# Patient Record
Sex: Female | Born: 1975
Health system: Southern US, Community
[De-identification: ages and names within clinical notes are randomized; demographics above are authoritative.]

## PROBLEM LIST (undated history)

## (undated) DIAGNOSIS — M545 Low back pain, unspecified: Secondary | ICD-10-CM

## (undated) DIAGNOSIS — F410 Panic disorder [episodic paroxysmal anxiety] without agoraphobia: Secondary | ICD-10-CM

## (undated) DIAGNOSIS — Z8619 Personal history of other infectious and parasitic diseases: Secondary | ICD-10-CM

## (undated) DIAGNOSIS — E538 Deficiency of other specified B group vitamins: Secondary | ICD-10-CM

## (undated) DIAGNOSIS — F319 Bipolar disorder, unspecified: Secondary | ICD-10-CM

## (undated) DIAGNOSIS — F419 Anxiety disorder, unspecified: Secondary | ICD-10-CM

## (undated) DIAGNOSIS — E785 Hyperlipidemia, unspecified: Secondary | ICD-10-CM

## (undated) DIAGNOSIS — G8929 Other chronic pain: Secondary | ICD-10-CM

## (undated) DIAGNOSIS — I10 Essential (primary) hypertension: Secondary | ICD-10-CM

## (undated) DIAGNOSIS — E559 Vitamin D deficiency, unspecified: Secondary | ICD-10-CM

## (undated) DIAGNOSIS — E119 Type 2 diabetes mellitus without complications: Secondary | ICD-10-CM

## (undated) DIAGNOSIS — J45909 Unspecified asthma, uncomplicated: Secondary | ICD-10-CM

## (undated) DIAGNOSIS — K760 Fatty (change of) liver, not elsewhere classified: Secondary | ICD-10-CM

## (undated) DIAGNOSIS — F4310 Post-traumatic stress disorder, unspecified: Secondary | ICD-10-CM

## (undated) DIAGNOSIS — R5382 Chronic fatigue, unspecified: Secondary | ICD-10-CM

## (undated) DIAGNOSIS — G4700 Insomnia, unspecified: Secondary | ICD-10-CM

## (undated) HISTORY — DX: Essential (primary) hypertension: I10

## (undated) HISTORY — PX: PARTIAL HYSTERECTOMY: SHX80

## (undated) HISTORY — DX: Bipolar disorder, unspecified: F31.9

## (undated) HISTORY — PX: GASTROPLASTY DUODENAL SWITCH: SHX1699

## (undated) HISTORY — DX: Insomnia, unspecified: G47.00

## (undated) HISTORY — DX: Fatty (change of) liver, not elsewhere classified: K76.0

## (undated) HISTORY — DX: Post-traumatic stress disorder, unspecified: F43.10

## (undated) HISTORY — DX: Other chronic pain: G89.29

## (undated) HISTORY — PX: ABDOMINAL HYSTERECTOMY: SHX81

## (undated) HISTORY — DX: Hyperlipidemia, unspecified: E78.5

## (undated) HISTORY — DX: Personal history of other infectious and parasitic diseases: Z86.19

## (undated) HISTORY — DX: Morbid (severe) obesity due to excess calories: E66.01

## (undated) HISTORY — DX: Panic disorder (episodic paroxysmal anxiety): F41.0

## (undated) HISTORY — DX: Deficiency of other specified B group vitamins: E53.8

## (undated) HISTORY — DX: Anxiety disorder, unspecified: F41.9

## (undated) HISTORY — DX: Chronic fatigue, unspecified: R53.82

## (undated) HISTORY — DX: Type 2 diabetes mellitus without complications: E11.9

## (undated) HISTORY — DX: Unspecified asthma, uncomplicated: J45.909

## (undated) HISTORY — DX: Low back pain, unspecified: M54.50

## (undated) HISTORY — PX: TUBAL LIGATION: SHX77

## (undated) HISTORY — DX: Vitamin D deficiency, unspecified: E55.9

## (undated) HISTORY — PX: SALPINGECTOMY: SHX328

## (undated) HISTORY — DX: Low back pain: M54.5

## (undated) MED FILL — Iron Sucrose Inj 20 MG/ML (Fe Equiv): INTRAVENOUS | Qty: 10 | Status: AC

---

## 2004-03-07 ENCOUNTER — Emergency Department: Payer: Self-pay | Admitting: General Practice

## 2004-09-04 ENCOUNTER — Emergency Department: Payer: Self-pay | Admitting: Emergency Medicine

## 2004-11-14 ENCOUNTER — Emergency Department: Payer: Self-pay | Admitting: Internal Medicine

## 2005-07-08 ENCOUNTER — Emergency Department: Payer: Self-pay | Admitting: Emergency Medicine

## 2005-10-28 ENCOUNTER — Emergency Department: Payer: Self-pay | Admitting: Emergency Medicine

## 2006-01-02 ENCOUNTER — Encounter: Admission: RE | Admit: 2006-01-02 | Discharge: 2006-01-02 | Payer: Self-pay | Admitting: Family Medicine

## 2006-04-28 ENCOUNTER — Emergency Department: Payer: Self-pay | Admitting: Emergency Medicine

## 2006-12-29 ENCOUNTER — Emergency Department: Payer: Self-pay | Admitting: Emergency Medicine

## 2007-11-17 ENCOUNTER — Emergency Department: Payer: Self-pay | Admitting: Emergency Medicine

## 2007-11-30 ENCOUNTER — Emergency Department: Payer: Self-pay | Admitting: Emergency Medicine

## 2008-01-13 ENCOUNTER — Emergency Department: Payer: Self-pay | Admitting: Emergency Medicine

## 2008-02-22 ENCOUNTER — Ambulatory Visit: Payer: Self-pay | Admitting: Obstetrics and Gynecology

## 2008-05-31 ENCOUNTER — Emergency Department: Payer: Self-pay | Admitting: Emergency Medicine

## 2008-07-05 ENCOUNTER — Encounter: Payer: Self-pay | Admitting: Anesthesiology

## 2008-08-01 ENCOUNTER — Encounter: Payer: Self-pay | Admitting: Anesthesiology

## 2008-12-15 ENCOUNTER — Emergency Department: Payer: Self-pay | Admitting: Emergency Medicine

## 2009-10-01 ENCOUNTER — Ambulatory Visit: Payer: Self-pay | Admitting: Internal Medicine

## 2009-10-10 ENCOUNTER — Emergency Department: Payer: Self-pay | Admitting: Internal Medicine

## 2009-10-16 ENCOUNTER — Ambulatory Visit: Payer: Self-pay | Admitting: Internal Medicine

## 2009-10-17 ENCOUNTER — Ambulatory Visit: Payer: Self-pay | Admitting: Obstetrics and Gynecology

## 2009-10-20 ENCOUNTER — Ambulatory Visit: Payer: Self-pay | Admitting: Obstetrics and Gynecology

## 2009-11-01 ENCOUNTER — Ambulatory Visit: Payer: Self-pay | Admitting: Internal Medicine

## 2011-03-12 ENCOUNTER — Emergency Department: Payer: Self-pay | Admitting: Emergency Medicine

## 2011-04-24 ENCOUNTER — Ambulatory Visit: Payer: Self-pay | Admitting: Obstetrics and Gynecology

## 2011-05-06 ENCOUNTER — Ambulatory Visit: Payer: Self-pay | Admitting: Obstetrics and Gynecology

## 2011-05-09 LAB — PATHOLOGY REPORT

## 2012-01-10 ENCOUNTER — Ambulatory Visit: Payer: Self-pay

## 2012-01-15 ENCOUNTER — Ambulatory Visit: Payer: Self-pay

## 2012-01-22 ENCOUNTER — Ambulatory Visit: Payer: Self-pay

## 2012-02-02 ENCOUNTER — Ambulatory Visit: Payer: Self-pay

## 2012-03-03 ENCOUNTER — Ambulatory Visit: Payer: Self-pay

## 2012-06-15 ENCOUNTER — Ambulatory Visit: Payer: Self-pay | Admitting: Family Medicine

## 2012-08-26 ENCOUNTER — Ambulatory Visit: Payer: Self-pay | Admitting: Family Medicine

## 2012-10-01 ENCOUNTER — Ambulatory Visit: Payer: Self-pay | Admitting: Family Medicine

## 2012-12-23 ENCOUNTER — Emergency Department: Payer: Self-pay | Admitting: Emergency Medicine

## 2013-05-05 ENCOUNTER — Encounter (INDEPENDENT_AMBULATORY_CARE_PROVIDER_SITE_OTHER): Payer: Self-pay

## 2013-05-05 ENCOUNTER — Encounter: Payer: Self-pay | Admitting: Cardiovascular Disease

## 2013-05-05 ENCOUNTER — Ambulatory Visit (INDEPENDENT_AMBULATORY_CARE_PROVIDER_SITE_OTHER): Payer: BC Managed Care – PPO | Admitting: Cardiovascular Disease

## 2013-05-05 VITALS — BP 106/92 | HR 72 | Ht 71.0 in | Wt 240.5 lb

## 2013-05-05 DIAGNOSIS — R079 Chest pain, unspecified: Secondary | ICD-10-CM

## 2013-05-05 DIAGNOSIS — R0789 Other chest pain: Secondary | ICD-10-CM

## 2013-05-05 NOTE — Progress Notes (Signed)
Kiara Le Date of Birth  [birth year]       [facility]    [place] [address]  [address] [place], [place]  [address]   [place], [place]  [address] [phone]     [phone]   Fax  [phone]    Fax [phone]  Problem List: 1. Chest pain   History of Present Illness:  Kiara Le is a 37 yo who is referred for further evaluation of CP and dyspnea.   She was evaluated at [facility] ER and was released.   The pain started about 30-445 minutes after she had tried to eat and  lasted for 90 minutes.   She called [facility] ( has had bariatricsurgery at [facility] Oct. 7, 2014).   She called  EMS and was taken to [facility]  received SL NTG which seemed to relieved the CP.  She did have some fatigue.    She has had some CP last week also about 30 minutes after she ate something.  - lasted 30 minutes.   She did not call EMS that time.    She does not get any exercise (since her surgery)..  She has not eaten well since her surgery.   She has significant nausea and some gagging when she tries to eat.   She has lost 40 lbs since her Bariatric surgery in Oct.  She has been able to stop her diabetic medications, her blood pressure pills, and her cholesterol pills.  Current Outpatient Prescriptions  Medication Sig Dispense Refill  . CALCIUM PO Take 2,400 mg by mouth daily.      . Multiple Vitamin (MULTIVITAMIN) tablet Take 2 tablets by mouth daily.      . NON FORMULARY Vitamin A+D+E+K 1 tablet daily.      Marland Kitchen oxyCODONE-acetaminophen (PERCOCET) 10-325 MG per tablet Take 1 tablet by mouth as needed for pain.      . pantoprazole (PROTONIX) 40 MG tablet Take 40 mg by mouth daily.       No current facility-administered medications for this visit.     Allergies  Allergen Reactions  . Tetanus Toxoids     Swelling, hot    Past Medical History  Diagnosis Date  . Hypertension   . Hyperlipidemia   . Diabetes mellitus without complication     . Asthma     Past Surgical History  Procedure Laterality Date  . Tubal ligation    . Partial hysterectomy    . Gastroplasty duodenal switch      History  Smoking status  . Never Smoker   Smokeless tobacco  . Not on file    History  Alcohol Use No    Comment: occassional    Family History  Problem Relation Age of Onset  . Heart disease Father   . Hypertension Father   . Hyperlipidemia Father     Reviw of Systems:  Reviewed in the HPI.  All other systems are negative.  Physical Exam: Blood pressure 106/92, pulse 72, height 5\' 11"  (1.803 m), weight 240 lb 8 oz (109.09 kg). General: Young female, morbidly obese, in no acute distress.  Head: Normocephalic, atraumatic, sclera non-icteric, mucus membranes are moist,   Neck: Supple. Carotids are 2 + without bruits. No JVD   Lungs: Clear   Heart: RR, normal S1, S2  Abdomen: Soft, non-tender, non-distended with normal bowel sounds.  Msk:  Strength and tone are normal   Extremities: No clubbing or  cyanosis. No edema.  Distal pedal pulses are 2+ and equal    Neuro: CN II - XII intact.  Alert and oriented X 3.   Psych:  Normal   ECG: Dc. 3, 2014:  NSR at 72.  Low voltage.  She has no changes.   Assessment / Plan:

## 2013-05-05 NOTE — Patient Instructions (Signed)
Your physician recommends that you schedule a follow-up appointment in: 3 months. Your physician recommends that you continue on your current medications as directed. Please refer to the Current Medication list given to you today. 

## 2013-05-05 NOTE — Assessment & Plan Note (Signed)
Larrissa presents for further evaluation of several episodes of chest discomfort. These episodes of chest discomfort have occurred about 30 minutes after she had tried eats something. She's had significant difficulty eating since her bariatric surgery. She becomes nauseated and gags.  She does not get any exercise but does not have any episodes of chest pain doing her normal activities such as walking around the house.  She's had a for a work up in the [facility] emergency room and was discharged. Not on any evidence of myocardial infarction.  At this point I think that her pains are likely do to a GI etiology. She's had bariatric surgery and has had significant difficulties in eating. At times it she has had something the she's developed chest pain.  We discussed the possibility of doing a  [employer].  She is morbidly obese and the likelihood of her having a false-positive test is fairly high. I think that the odds of her having a false-positive test is greater than  a true positive.  I've asked her to try eating small portions. I've asked her to try to walk a little bit. She has recurrent episodes of chest pain that are associated with exertion and she needs to call us and or go to the hospital. I'll see her again in 3 months for followup visit. We'll reassess her at that time.  Im Pleased that she's been able to stop her diabetic medications hypercholesterolemia medications and blood pressure medications. She seems to be doing well and has lost 40 pounds since her bariatric surgery.

## 2013-05-26 ENCOUNTER — Encounter: Payer: Self-pay | Admitting: *Deleted

## 2013-06-28 ENCOUNTER — Ambulatory Visit: Payer: Self-pay | Admitting: Family Medicine

## 2013-07-21 ENCOUNTER — Ambulatory Visit: Payer: Self-pay | Admitting: Obstetrics and Gynecology

## 2013-07-21 LAB — HEMOGLOBIN: HGB: 13.4 g/dL (ref 12.0–16.0)

## 2013-07-26 ENCOUNTER — Ambulatory Visit: Payer: Self-pay | Admitting: Obstetrics and Gynecology

## 2013-07-26 LAB — URINALYSIS, COMPLETE
BILIRUBIN, UR: NEGATIVE
GLUCOSE, UR: NEGATIVE mg/dL (ref 0–75)
Ketone: NEGATIVE
NITRITE: NEGATIVE
Ph: 5 (ref 4.5–8.0)
RBC,UR: 39 /HPF (ref 0–5)
SPECIFIC GRAVITY: 1.019 (ref 1.003–1.030)
Squamous Epithelial: 5

## 2013-07-28 LAB — PATHOLOGY REPORT

## 2013-07-30 LAB — URINE CULTURE

## 2013-08-02 ENCOUNTER — Ambulatory Visit: Payer: BC Managed Care – PPO | Admitting: Cardiovascular Disease

## 2013-08-02 ENCOUNTER — Encounter: Payer: Self-pay | Admitting: *Deleted

## 2014-09-22 LAB — CBC AND DIFFERENTIAL
HCT: 40 % (ref 36–46)
Hemoglobin: 13.5 g/dL (ref 12.0–16.0)
NEUTROS ABS: 3 /uL
PLATELETS: 210 10*3/uL (ref 150–399)
WBC: 5.2 10*3/mL

## 2014-09-22 LAB — HEMOGLOBIN A1C: Hgb A1c MFr Bld: 5.3 % (ref 4.0–6.0)

## 2014-09-22 LAB — BASIC METABOLIC PANEL
BUN: 8 mg/dL (ref 4–21)
Creatinine: 0.6 mg/dL (ref 0.5–1.1)
GLUCOSE: 89 mg/dL
Potassium: 3.9 mmol/L (ref 3.4–5.3)
SODIUM: 141 mmol/L (ref 137–147)

## 2014-09-22 LAB — HEPATIC FUNCTION PANEL
ALT: 28 U/L (ref 7–35)
AST: 17 U/L (ref 13–35)
Alkaline Phosphatase: 106 U/L (ref 25–125)
Bilirubin, Total: 0.5 mg/dL

## 2014-09-22 LAB — LIPID PANEL
Cholesterol: 118 mg/dL (ref 0–200)
HDL: 49 mg/dL (ref 35–70)
LDL Cholesterol: 56 mg/dL
Triglycerides: 64 mg/dL (ref 40–160)

## 2014-09-22 LAB — TSH: TSH: 1.76 u[IU]/mL (ref 0.41–5.90)

## 2014-09-24 NOTE — Op Note (Signed)
PATIENT NAME:  Kiara Le, Kiara Le MR#:  [id number] DATE OF BIRTH:  [birth year]  DATE OF PROCEDURE:  07/26/2013  PREOPERATIVE DIAGNOSIS: Chronic pelvic pain.   POSTOPERATIVE DIAGNOSES: 1. Significant pelvic adhesions. 2. Right ovarian cyst with adhesions to the sidewall.    OPERATION:  1. Laparoscopic pelvic adhesiolysis incorporating greater than 70% of operating time.  2. Right salpingo-oophorectomy.  3. Left salpingectomy.   ANESTHESIA: General.  SURGEON: Boykin Nearing, MD  INDICATIONS: This is a 39 year old gravida 3, para 2 patient with chronic pelvic pain, right greater than left. The patient has undergone a gastric bypass and has been worked up for the source of the pain being gastrointestinal which was ruled out.   PROCEDURE: After adequate general endotracheal anesthesia, the patient was placed in the dorsal supine position with the legs in the Loraine stirrups. The patient was prepped and draped in normal sterile fashion. A Foley catheter was placed into the bladder and yielded 100 mL of clear urine. A sponge stick was placed in the vagina to be used for vaginal manipulation during the procedure. A 15 mm infraumbilical incision was made after injecting with 0.5% Marcaine. The laparoscope was advanced into the abdominal cavity with the Optiview cannula. The patient's abdomen was insufflated. The second port site was placed in the left lower quadrant 3 cm medial to the left anterior iliac spine. Incision was made. Brisk bleeding followed which required cautery with the Kleppingers and a deep 2-0 Vicryl suture to control the bleeding, 20 mL of blood loss during control of this bleeding. Ultimately, the 11 mm port was advanced into the  abdominal cavity under direct visualization. A third port site was placed in the right lower quadrant 3 cm medial to the right anterior iliac spine, 5 mm port was advanced into the abdominal cavity.   INITIAL IMPRESSION: Multiple adhesions noted with the  bowel to the vaginal cuff. The right ovary was densely adherent to the sidewall circumferentially with bowel and adhesions. Harmonic scalpel was brought up to the operative field and the adhesions were removed from the vaginal cuff to give better visualization to the right adnexa. Meticulous dissection occurred over the next hour to remove the scar tissue so that the infundibulopelvic ligament could be identified. The infundibulopelvic ligament was then cauterized with the Kleppingers and was then opened with the Harmonic scalpel. The course of the right ureter was identified prior to proceeding with this. Ultimately, the right fallopian tube and ovary was dissected free from the right ovarian fossa and from the bowel and adhesions inferiorly. Raw bleeding edge was controlled with Kleppinger cautery and Arista. The left fallopian tube was identified and the distal portion of fallopian tube was placed on traction and the Harmonic scalpel was used to remove this from the left ovary. There were some dense adhesions as well with the sigmoid colon attached to this left ovary. Harmonic scalpel was used to free the left ovary. The patient's abdomen was previously irrigated and as noted. Arista was placed at the right ovarian fossa bed to control hemostasis. Pressure was dropped to 6 mmHg, good hemostasis noted. The patient tolerated the procedure well. Again 70% of total operating time entailed adhesiolysis. The patient's abdomen was deflated and the port sites were closed with a deep fascial layer with for the infraumbilical and the left lower quadrant port sites, and port sites were closed with interrupted 4-0 subcuticular sutures. Steri-Strips applied and a Tegaderm placed. A Foley catheter was removed and the sponge stick was  removed. There were no complications. The patient tolerated the procedure well was taken to the recovery room in good condition. Of note, at the end of the case there was normal ureteral  peristalsis on the right.   ____________________________ Boykin Nearing, MD tjs:sg D: 07/26/2013 09:36:01 ET T: 07/26/2013 11:40:37 ET JOB#: [id number]  cc: Boykin Nearing, MD, <Dictator> Boykin Nearing MD ELECTRONICALLY SIGNED 07/29/2013 9:24

## 2014-11-24 ENCOUNTER — Ambulatory Visit (INDEPENDENT_AMBULATORY_CARE_PROVIDER_SITE_OTHER): Payer: PRIVATE HEALTH INSURANCE | Admitting: Family Medicine

## 2014-11-24 ENCOUNTER — Encounter: Payer: Self-pay | Admitting: Family Medicine

## 2014-11-24 ENCOUNTER — Encounter (INDEPENDENT_AMBULATORY_CARE_PROVIDER_SITE_OTHER): Payer: Self-pay

## 2014-11-24 VITALS — BP 122/78 | HR 85 | Temp 98.4°F | Resp 16 | Ht 70.0 in | Wt 186.6 lb

## 2014-11-24 DIAGNOSIS — F4001 Agoraphobia with panic disorder: Secondary | ICD-10-CM

## 2014-11-24 DIAGNOSIS — G8929 Other chronic pain: Secondary | ICD-10-CM | POA: Diagnosis not present

## 2014-11-24 DIAGNOSIS — R252 Cramp and spasm: Secondary | ICD-10-CM | POA: Insufficient documentation

## 2014-11-24 DIAGNOSIS — M5410 Radiculopathy, site unspecified: Secondary | ICD-10-CM | POA: Diagnosis not present

## 2014-11-24 DIAGNOSIS — M545 Low back pain, unspecified: Secondary | ICD-10-CM | POA: Insufficient documentation

## 2014-11-24 DIAGNOSIS — M5416 Radiculopathy, lumbar region: Secondary | ICD-10-CM | POA: Insufficient documentation

## 2014-11-24 DIAGNOSIS — K219 Gastro-esophageal reflux disease without esophagitis: Secondary | ICD-10-CM | POA: Insufficient documentation

## 2014-11-24 DIAGNOSIS — M25569 Pain in unspecified knee: Secondary | ICD-10-CM | POA: Insufficient documentation

## 2014-11-24 DIAGNOSIS — R5383 Other fatigue: Secondary | ICD-10-CM

## 2014-11-24 DIAGNOSIS — R5381 Other malaise: Secondary | ICD-10-CM | POA: Insufficient documentation

## 2014-11-24 MED ORDER — OXYCODONE-ACETAMINOPHEN 10-325 MG PO TABS: 1.0000 | ORAL_TABLET | Freq: Two times a day (BID) | ORAL | Status: DC | PRN

## 2014-11-24 MED ORDER — ALPRAZOLAM 1 MG PO TABS: 1.0000 mg | ORAL_TABLET | Freq: Two times a day (BID) | ORAL | Status: DC | PRN

## 2014-11-24 NOTE — Progress Notes (Signed)
Name: Kiara Le   MRN: [id number]    DOB: Oct 13, 1975   Date:11/24/2014       Progress Note  Subjective  Chief Complaint  Chief Complaint  Patient presents with  . Hypertension    Patient states that she is checking her blood pressure at home.   . Diabetes    Patient states that she is still checking blood sugars- they have been normal-patient reports  . Anxiety    Controlled well by Alprazolam.     Anxiety Presents for follow-up visit. The problem has been waxing and waning. Symptoms include depressed mood, dizziness, irritability and panic. Patient reports no chest pain, excessive worry, nervous/anxious behavior or shortness of breath. Symptoms occur most days. The severity of symptoms is moderate. The symptoms are aggravated by family issues and work stress. The quality of sleep is fair.   Past treatments include benzodiazephines.  Back Pain This is a chronic problem. The current episode started more than 1 year ago. The pain is present in the lumbar spine and gluteal. The quality of the pain is described as aching and stabbing. The pain radiates to the right thigh. The pain is at a severity of 4/10. The pain is moderate. The pain is the same all the time. The symptoms are aggravated by position. Pertinent negatives include no bladder incontinence, bowel incontinence, chest pain, dysuria, fever, leg pain or numbness.      Past Medical History  Diagnosis Date  . Hypertension   . Hyperlipidemia   . Diabetes mellitus without complication   . Asthma   . Insomnia   . History of Helicobacter pylori infection   . Vitamin B12 deficiency   . Vitamin D deficiency   . Morbid obesity   . Panic disorder   . Anxiety   . Fatty liver disease, nonalcoholic   . Chronic low back pain   . Chronic fatigue     Past Surgical History  Procedure Laterality Date  . Tubal ligation    . Partial hysterectomy    . Gastroplasty duodenal switch    . Salpingectomy      Family History    Problem Relation Age of Onset  . Heart disease Father   . Hypertension Father   . Hyperlipidemia Father   . Depression Sister   . Thyroid disease Sister     History   Social History  . Marital Status: Married    Spouse Name: Michiel Cowboy  . Number of Children: 2  . Years of Education: N/A   Occupational History  . Customer Service    Social History Main Topics  . Smoking status: Never Smoker   . Smokeless tobacco: Not on file  . Alcohol Use: No  . Drug Use: No  . Sexual Activity: Not on file   Other Topics Concern  . Not on file   Social History Narrative     Current outpatient prescriptions:  .  ALPRAZolam (XANAX) 1 MG tablet, Take 1 tablet by mouth 2 (two) times daily., Disp: , Rfl:  .  CALCIUM PO, Take 2,400 mg by mouth daily., Disp: , Rfl:  .  diclofenac sodium (VOLTAREN) 1 % GEL, Place 1 application onto the skin 2 (two) times daily., Disp: , Rfl:  .  ergocalciferol (VITAMIN D2) 50000 UNITS capsule, Take 1 capsule by mouth once a week., Disp: , Rfl:  .  glucose blood (ACCU-CHEK AVIVA PLUS) test strip, 1 strip by Does not apply route daily., Disp: , Rfl:  .  Hydrocodone-Acetaminophen 5-300 MG TABS, Take 1 tablet by mouth daily., Disp: , Rfl: 0 .  Multiple Vitamin (MULTIVITAMIN) tablet, Take 2 tablets by mouth daily., Disp: , Rfl:  .  NON FORMULARY, Vitamin A+D+E+K 1 tablet daily., Disp: , Rfl:  .  oxyCODONE-acetaminophen (PERCOCET) 10-325 MG per tablet, Take 1 tablet by mouth as needed for pain., Disp: , Rfl:  .  pantoprazole (PROTONIX) 40 MG tablet, Take 40 mg by mouth daily., Disp: , Rfl:   Allergies  Allergen Reactions  . Cyclobenzaprine     vaginal bleeding  . Duloxetine Hcl     constipation  . Fluoxetine     severely depressed sexual function  . Tetanus Toxoids     Swelling, hot     Review of Systems  Constitutional: Positive for irritability. Negative for fever.  Respiratory: Negative for shortness of breath.   Cardiovascular: Negative for chest  pain.  Gastrointestinal: Negative for bowel incontinence.  Genitourinary: Negative for bladder incontinence and dysuria.  Musculoskeletal: Positive for back pain.  Neurological: Positive for dizziness. Negative for numbness.  Psychiatric/Behavioral: The patient is not nervous/anxious.       Objective  Filed Vitals:   11/24/14 1606  BP: 122/78  Pulse: 85  Temp: 98.4 F (36.9 C)  Resp: 16  Height: 5\' 10"  (1.778 m)  Weight: 186 lb 9.6 oz (84.641 kg)  SpO2: 97%    Physical Exam  Constitutional: She is oriented to person, place, and time and well-developed, well-nourished, and in no distress.  HENT:  Head: Normocephalic and atraumatic.  Cardiovascular: Normal rate and regular rhythm.   Pulmonary/Chest: Effort normal and breath sounds normal.  Musculoskeletal:       Back:  Neurological: She is alert and oriented to person, place, and time.  Skin: Skin is warm and dry.  Psychiatric: Memory, affect and judgment normal.  Nursing note and vitals reviewed.   Assessment & Plan 1. Panic disorder with agoraphobia and moderate panic attacks Symptoms stable on present benzodiazepine therapy. Patient is aware of the tolerance potential of benzodiazepines. She is taking the medication as prescribed. Refills provided and follow-up in one month. - ALPRAZolam (XANAX) 1 MG tablet; Take 1 tablet (1 mg total) by mouth 2 (two) times daily as needed for anxiety.  Dispense: 60 tablet; Refill: 0  2. Chronic radicular low back pain Symptoms of low back pain are stable on present opioid therapy. Patient is compliant with the controlled substances agreement. She is aware of the potential interaction between opioids and benzodiazepines. No reported adverse effects. Refills provided and follow-up in one month. - oxyCODONE-acetaminophen (PERCOCET) 10-325 MG per tablet; Take 1 tablet by mouth 2 (two) times daily as needed for pain.  Dispense: 60 tablet; Refill: 0  There are no diagnoses linked to this  encounter.  Diar Berkel [facility] 11/24/2014 4:12 PM

## 2014-12-23 ENCOUNTER — Ambulatory Visit: Payer: PRIVATE HEALTH INSURANCE | Admitting: Family Medicine

## 2014-12-27 ENCOUNTER — Ambulatory Visit (INDEPENDENT_AMBULATORY_CARE_PROVIDER_SITE_OTHER): Payer: PRIVATE HEALTH INSURANCE | Admitting: Family Medicine

## 2014-12-27 ENCOUNTER — Encounter (INDEPENDENT_AMBULATORY_CARE_PROVIDER_SITE_OTHER): Payer: Self-pay

## 2014-12-27 ENCOUNTER — Encounter: Payer: Self-pay | Admitting: Family Medicine

## 2014-12-27 VITALS — BP 119/73 | HR 83 | Temp 98.3°F | Resp 17 | Ht 70.0 in | Wt 183.4 lb

## 2014-12-27 DIAGNOSIS — F418 Other specified anxiety disorders: Secondary | ICD-10-CM

## 2014-12-27 DIAGNOSIS — E876 Hypokalemia: Secondary | ICD-10-CM | POA: Diagnosis not present

## 2014-12-27 DIAGNOSIS — M5410 Radiculopathy, site unspecified: Secondary | ICD-10-CM | POA: Diagnosis not present

## 2014-12-27 DIAGNOSIS — M5416 Radiculopathy, lumbar region: Principal | ICD-10-CM

## 2014-12-27 DIAGNOSIS — F329 Major depressive disorder, single episode, unspecified: Secondary | ICD-10-CM

## 2014-12-27 DIAGNOSIS — A09 Infectious gastroenteritis and colitis, unspecified: Secondary | ICD-10-CM | POA: Diagnosis not present

## 2014-12-27 DIAGNOSIS — G8929 Other chronic pain: Secondary | ICD-10-CM | POA: Diagnosis not present

## 2014-12-27 DIAGNOSIS — F419 Anxiety disorder, unspecified: Secondary | ICD-10-CM

## 2014-12-27 DIAGNOSIS — F32A Depression, unspecified: Secondary | ICD-10-CM

## 2014-12-27 MED ORDER — OXYCODONE-ACETAMINOPHEN 10-325 MG PO TABS: 1.0000 | ORAL_TABLET | Freq: Two times a day (BID) | ORAL | Status: DC | PRN

## 2014-12-27 MED ORDER — ALPRAZOLAM 1 MG PO TABS
1.0000 mg | ORAL_TABLET | Freq: Two times a day (BID) | ORAL | Status: DC | PRN
Start: 2014-12-27 — End: 2015-01-24

## 2014-12-27 NOTE — Progress Notes (Signed)
Name: Kiara Le   MRN: [id number]    DOB: 04-17-1976   Date:12/27/2014       Progress Note  Subjective  Chief Complaint  Chief Complaint  Patient presents with  . Follow-up    Hosp. Chest pain    Diarrhea  This is a new problem. The problem has been resolved. The stool consistency is described as watery. Pertinent negatives include no chills or vomiting. There are no known risk factors. She has tried anti-motility drug, electrolyte solution and increased fluids for the symptoms. The treatment provided significant relief. There is no history of bowel resection, inflammatory bowel disease or malabsorption.  Anxiety Presents for follow-up visit. The problem has been unchanged. Symptoms include depressed mood, dizziness, irritability, nervous/anxious behavior and panic. Patient reports no excessive worry or shortness of breath. Symptoms occur most days. The severity of symptoms is moderate. The symptoms are aggravated by family issues and work stress. The quality of sleep is fair.   Past treatments include benzodiazephines. The treatment provided moderate relief. Compliance with prior treatments has been good.  Back Pain This is a chronic problem. The current episode started more than 1 year ago. The pain is present in the lumbar spine and gluteal. The quality of the pain is described as aching, stabbing and shooting. The pain radiates to the right thigh. The pain is at a severity of 4/10. The pain is moderate. The pain is the same all the time. The symptoms are aggravated by position and bending. Pertinent negatives include no bladder incontinence, bowel incontinence, leg pain or numbness. She has tried analgesics for the symptoms. The treatment provided significant relief.      Past Medical History  Diagnosis Date  . Hypertension   . Hyperlipidemia   . Diabetes mellitus without complication   . Asthma   . Insomnia   . History of Helicobacter pylori infection   . Vitamin B12  deficiency   . Vitamin D deficiency   . Morbid obesity   . Panic disorder   . Anxiety   . Fatty liver disease, nonalcoholic   . Chronic low back pain   . Chronic fatigue     Past Surgical History  Procedure Laterality Date  . Tubal ligation    . Partial hysterectomy    . Gastroplasty duodenal switch    . Salpingectomy      Family History  Problem Relation Age of Onset  . Heart disease Father   . Hypertension Father   . Hyperlipidemia Father   . Depression Sister   . Thyroid disease Sister     History   Social History  . Marital Status: Married    Spouse Name: Michiel Cowboy  . Number of Children: 2  . Years of Education: N/A   Occupational History  . Customer Service    Social History Main Topics  . Smoking status: Never Smoker   . Smokeless tobacco: Not on file  . Alcohol Use: No  . Drug Use: No  . Sexual Activity: Not on file   Other Topics Concern  . Not on file   Social History Narrative     Current outpatient prescriptions:  .  ALPRAZolam (XANAX) 1 MG tablet, Take 1 tablet (1 mg total) by mouth 2 (two) times daily as needed for anxiety., Disp: 60 tablet, Rfl: 0 .  CALCIUM PO, Take 2,400 mg by mouth daily., Disp: , Rfl:  .  diclofenac sodium (VOLTAREN) 1 % GEL, Place 1 application onto the skin 2 (two)  times daily., Disp: , Rfl:  .  ergocalciferol (VITAMIN D2) 50000 UNITS capsule, Take 1 capsule by mouth once a week., Disp: , Rfl:  .  glucose blood (ACCU-CHEK AVIVA PLUS) test strip, 1 strip by Does not apply route daily., Disp: , Rfl:  .  Hydrocodone-Acetaminophen 5-300 MG TABS, Take 1 tablet by mouth daily., Disp: , Rfl: 0 .  Multiple Vitamin (MULTIVITAMIN) tablet, Take 2 tablets by mouth daily., Disp: , Rfl:  .  NON FORMULARY, Vitamin A+D+E+K 1 tablet daily., Disp: , Rfl:  .  oxyCODONE-acetaminophen (PERCOCET) 10-325 MG per tablet, Take 1 tablet by mouth 2 (two) times daily as needed for pain., Disp: 60 tablet, Rfl: 0 .  pantoprazole (PROTONIX) 40 MG  tablet, Take 40 mg by mouth daily., Disp: , Rfl:   Allergies  Allergen Reactions  . 2,4-D Dimethylamine (Amisol) Swelling  . Cyclobenzaprine     vaginal bleeding  . Duloxetine Hcl     constipation  . Fluoxetine     severely depressed sexual function  . Nsaids Other (See Comments)    Cannot take due to abd surgery  . Tetanus Toxoids     Swelling, hot     Review of Systems  Constitutional: Positive for irritability. Negative for chills.  Respiratory: Negative for shortness of breath.   Gastrointestinal: Positive for diarrhea. Negative for vomiting and bowel incontinence.  Genitourinary: Negative for bladder incontinence.  Musculoskeletal: Positive for back pain.  Neurological: Positive for dizziness. Negative for numbness.  Psychiatric/Behavioral: Positive for depression. The patient is nervous/anxious.       Objective  Filed Vitals:   12/27/14 1006  BP: 119/73  Pulse: 83  Temp: 98.3 F (36.8 C)  TempSrc: Oral  Resp: 17  Height: 5\' 10"  (1.778 m)  Weight: 183 lb 6.4 oz (83.19 kg)  SpO2: 98%    Physical Exam  Constitutional: She is oriented to person, place, and time and well-developed, well-nourished, and in no distress.  Cardiovascular: Normal rate and regular rhythm.   Pulmonary/Chest: Effort normal and breath sounds normal.  Abdominal: Soft. Bowel sounds are normal.  Musculoskeletal:       Lumbar back: She exhibits decreased range of motion, tenderness, pain and spasm. She exhibits no swelling.       Back:  Neurological: She is alert and oriented to person, place, and time.  Skin: Skin is warm and dry.  Psychiatric: Mood, memory, affect and judgment normal.  Nursing note and vitals reviewed.    Assessment & Plan 1. Chronic radicular low back pain Pain responsive to chronic opioid therapy. Patient compliant with controlled substances agreement. She is aware of the dependence potential of opioids. Refills provided and follow-up in one month -  oxyCODONE-acetaminophen (PERCOCET) 10-325 MG per tablet; Take 1 tablet by mouth 2 (two) times daily as needed for pain.  Dispense: 60 tablet; Refill: 0  2. Anxiety and depression Symptoms of anxiety are responsive to alprazolam 1 mg twice a day when necessary. Patient is aware of the dependence potential and the interactions of benzodiazepines. I have recommended that she should contact Dr. Bridgett Larsson to be restarted on an antidepressant. Patient verbalized understanding. - ALPRAZolam (XANAX) 1 MG tablet; Take 1 tablet (1 mg total) by mouth 2 (two) times daily as needed for anxiety.  Dispense: 60 tablet; Refill: 0  3. Diarrhea of infectious origin Diarrhea has resolved. Notes from [facility] reviewed. Recheck comprehensive metabolic panel. - Comprehensive Metabolic Panel (CMET)  4. Hypokalemia, gastrointestinal losses Recheck abnormal labs from [facility]. - Comprehensive  Metabolic Panel (CMET)  5. Hypomagnesemia Recheck abnormal labs from [facility] - Comprehensive Metabolic Panel (CMET) - Magnesium    Kelsee Preslar [facility] 12/27/2014 10:51 AM

## 2014-12-28 LAB — COMPREHENSIVE METABOLIC PANEL
A/G RATIO: 1.8 (ref 1.1–2.5)
ALK PHOS: 107 IU/L (ref 39–117)
ALT: 26 IU/L (ref 0–32)
AST: 17 IU/L (ref 0–40)
Albumin: 4.1 g/dL (ref 3.5–5.5)
BILIRUBIN TOTAL: 0.4 mg/dL (ref 0.0–1.2)
BUN / CREAT RATIO: 13 (ref 8–20)
BUN: 9 mg/dL (ref 6–20)
CHLORIDE: 101 mmol/L (ref 97–108)
CO2: 24 mmol/L (ref 18–29)
Calcium: 9 mg/dL (ref 8.7–10.2)
Creatinine, Ser: 0.68 mg/dL (ref 0.57–1.00)
GFR calc non Af Amer: 113 mL/min/{1.73_m2} (ref >59)
GFR, EST AFRICAN AMERICAN: 130 mL/min/{1.73_m2} (ref >59)
GLOBULIN, TOTAL: 2.3 g/dL (ref 1.5–4.5)
Glucose: 86 mg/dL (ref 65–99)
Potassium: 4.6 mmol/L (ref 3.5–5.2)
Sodium: 141 mmol/L (ref 134–144)
Total Protein: 6.4 g/dL (ref 6.0–8.5)

## 2014-12-30 LAB — MAGNESIUM: Magnesium: 2 mg/dL (ref 1.6–2.3)

## 2015-01-03 LAB — SPECIMEN STATUS REPORT

## 2015-01-03 NOTE — Progress Notes (Signed)
Patient notified

## 2015-01-24 ENCOUNTER — Ambulatory Visit (INDEPENDENT_AMBULATORY_CARE_PROVIDER_SITE_OTHER): Payer: PRIVATE HEALTH INSURANCE | Admitting: Family Medicine

## 2015-01-24 ENCOUNTER — Encounter: Payer: Self-pay | Admitting: Family Medicine

## 2015-01-24 VITALS — BP 121/70 | HR 81 | Temp 98.3°F | Resp 18 | Ht 70.0 in | Wt 181.2 lb

## 2015-01-24 DIAGNOSIS — M1711 Unilateral primary osteoarthritis, right knee: Secondary | ICD-10-CM

## 2015-01-24 DIAGNOSIS — M5410 Radiculopathy, site unspecified: Secondary | ICD-10-CM | POA: Diagnosis not present

## 2015-01-24 DIAGNOSIS — F4001 Agoraphobia with panic disorder: Secondary | ICD-10-CM

## 2015-01-24 DIAGNOSIS — K219 Gastro-esophageal reflux disease without esophagitis: Secondary | ICD-10-CM | POA: Diagnosis not present

## 2015-01-24 DIAGNOSIS — G8929 Other chronic pain: Secondary | ICD-10-CM | POA: Diagnosis not present

## 2015-01-24 DIAGNOSIS — T753XXA Motion sickness, initial encounter: Secondary | ICD-10-CM

## 2015-01-24 DIAGNOSIS — M129 Arthropathy, unspecified: Secondary | ICD-10-CM

## 2015-01-24 DIAGNOSIS — M5416 Radiculopathy, lumbar region: Principal | ICD-10-CM

## 2015-01-24 MED ORDER — SCOPOLAMINE 1 MG/3DAYS TD PT72: 1.0000 | MEDICATED_PATCH | TRANSDERMAL | Status: DC

## 2015-01-24 MED ORDER — OXYCODONE-ACETAMINOPHEN 10-325 MG PO TABS: 1.0000 | ORAL_TABLET | Freq: Two times a day (BID) | ORAL | Status: DC | PRN

## 2015-01-24 MED ORDER — ALPRAZOLAM 1 MG PO TABS: 1.0000 mg | ORAL_TABLET | Freq: Two times a day (BID) | ORAL | Status: DC | PRN

## 2015-01-24 MED ORDER — PANTOPRAZOLE SODIUM 40 MG PO TBEC: 40.0000 mg | DELAYED_RELEASE_TABLET | Freq: Two times a day (BID) | ORAL | Status: DC

## 2015-01-24 MED ORDER — DICLOFENAC SODIUM 1 % TD GEL: 4.0000 g | Freq: Every day | TRANSDERMAL | Status: DC

## 2015-01-24 NOTE — Progress Notes (Signed)
Name: Kiara Le   MRN: [id number]    DOB: 09/26/1975   Date:01/24/2015       Progress Note  Subjective  Chief Complaint  Chief Complaint  Patient presents with  . Follow-up    4wk  . Medication Refill    all meds    Anxiety Presents for follow-up visit. Symptoms include depressed mood, nervous/anxious behavior, panic and shortness of breath. Patient reports no chest pain, insomnia or nausea.   Her past medical history is significant for anxiety/panic attacks. Past treatments include benzodiazephines. The treatment provided significant relief. Compliance with prior treatments has been good.  Gastrophageal Reflux She complains of heartburn. She reports no chest pain, no dysphagia or no nausea. She has tried a PPI for the symptoms. The treatment provided significant relief. Past procedures include an EGD (Bariatric Surgery at [facility]  Arthritis Presents for follow-up visit. She complains of pain and stiffness. She reports no joint swelling. Affected locations include the right knee. Her pain is at a severity of 6/10. Pertinent negatives include no fever. Past treatments include NSAIDs.  Back Pain This is a chronic problem. The problem is unchanged. The pain is present in the lumbar spine and gluteal. The quality of the pain is described as aching. The pain does not radiate. The pain is at a severity of 4/10. The symptoms are aggravated by bending, sitting and standing. Pertinent negatives include no chest pain or fever. She has tried analgesics for the symptoms.  pt. also requesting transdermal scopolamine patches for history of motion sickness. She will be leaving on a cruise in November 2016 and has experienced sea-sickness in the past.   Past Medical History  Diagnosis Date  . Hypertension   . Hyperlipidemia   . Diabetes mellitus without complication   . Asthma   . Insomnia   . History of Helicobacter pylori infection   . Vitamin B12 deficiency   . Vitamin D deficiency   .  Morbid obesity   . Panic disorder   . Anxiety   . Fatty liver disease, nonalcoholic   . Chronic low back pain   . Chronic fatigue     Past Surgical History  Procedure Laterality Date  . Tubal ligation    . Partial hysterectomy    . Gastroplasty duodenal switch    . Salpingectomy      Family History  Problem Relation Age of Onset  . Heart disease Father   . Hypertension Father   . Hyperlipidemia Father   . Depression Sister   . Thyroid disease Sister     Social History   Social History  . Marital Status: Married    Spouse Name: Michiel Cowboy  . Number of Children: 2  . Years of Education: N/A   Occupational History  . Customer Service    Social History Main Topics  . Smoking status: Never Smoker   . Smokeless tobacco: Not on file  . Alcohol Use: No  . Drug Use: No  . Sexual Activity: Not on file   Other Topics Concern  . Not on file   Social History Narrative     Current outpatient prescriptions:  .  ALPRAZolam (XANAX) 1 MG tablet, Take 1 tablet (1 mg total) by mouth 2 (two) times daily as needed for anxiety., Disp: 60 tablet, Rfl: 0 .  diclofenac sodium (VOLTAREN) 1 % GEL, Place 1 application onto the skin 2 (two) times daily., Disp: , Rfl:  .  ergocalciferol (VITAMIN D2) 50000 UNITS capsule, Take 1  capsule by mouth once a week., Disp: , Rfl:  .  glucose blood (ACCU-CHEK AVIVA PLUS) test strip, 1 strip by Does not apply route daily., Disp: , Rfl:  .  Multiple Vitamin (MULTIVITAMIN) tablet, Take 2 tablets by mouth daily., Disp: , Rfl:  .  NON FORMULARY, Vitamin A+D+E+K 1 tablet daily., Disp: , Rfl:  .  oxyCODONE-acetaminophen (PERCOCET) 10-325 MG per tablet, Take 1 tablet by mouth 2 (two) times daily as needed for pain., Disp: 60 tablet, Rfl: 0 .  pantoprazole (PROTONIX) 40 MG tablet, Take 40 mg by mouth daily., Disp: , Rfl:   Allergies  Allergen Reactions  . 2,4-D Dimethylamine (Amisol) Swelling  . Cyclobenzaprine     vaginal bleeding  . Duloxetine Hcl      constipation  . Fluoxetine     severely depressed sexual function  . Nsaids Other (See Comments)    Cannot take due to abd surgery  . Tetanus Toxoids     Swelling, hot     Review of Systems  Constitutional: Negative for fever, chills and malaise/fatigue.  Respiratory: Positive for shortness of breath.   Cardiovascular: Negative for chest pain.  Gastrointestinal: Positive for heartburn. Negative for dysphagia and nausea.  Musculoskeletal: Positive for back pain, joint pain, arthritis and stiffness. Negative for joint swelling.  Psychiatric/Behavioral: The patient is nervous/anxious. The patient does not have insomnia.       Objective  Filed Vitals:   01/24/15 0806  BP: 121/70  Pulse: 81  Temp: 98.3 F (36.8 C)  TempSrc: Oral  Resp: 18  Height: 5\' 10"  (1.778 m)  Weight: 181 lb 3.2 oz (82.192 kg)  SpO2: 99%    Physical Exam  Constitutional: She is oriented to person, place, and time and well-developed, well-nourished, and in no distress.  Cardiovascular: Normal rate and regular rhythm.   Pulmonary/Chest: Effort normal and breath sounds normal.  Musculoskeletal:       Right knee: She exhibits normal range of motion, no swelling and no effusion. No tenderness found.       Lumbar back: She exhibits pain. She exhibits normal range of motion, no tenderness, no swelling and no spasm.       Back:  Neurological: She is alert and oriented to person, place, and time.  Skin: Skin is warm and dry.  Psychiatric: Memory, affect and judgment normal.  Nursing note and vitals reviewed.   Assessment & Plan  1. Chronic radicular low back pain Stable on chronic opioid therapy. Patient aware of the drug interactions and dependence potential of opioids. She is compliant with controlled substances agreement. Refills provided and follow-up in one month. - oxyCODONE-acetaminophen (PERCOCET) 10-325 MG per tablet; Take 1 tablet by mouth 2 (two) times daily as needed for pain.  Dispense: 60  tablet; Refill: 0  2. Panic disorder with agoraphobia and moderate panic attacks Symptoms stable on alprazolam 1 mg twice a day when necessary. Patient aware of dependence potential of benzodiazepines. Refills provided and follow-up in one month. - ALPRAZolam (XANAX) 1 MG tablet; Take 1 tablet (1 mg total) by mouth 2 (two) times daily as needed for anxiety.  Dispense: 60 tablet; Refill: 0  3. Arthritis of right knee Using Voltaren gel for arthritis of the knees. Voltaren gel use is approved by her bariatric specialist. - diclofenac sodium (VOLTAREN) 1 % GEL; Apply 4 g topically daily.  Dispense: 100 g; Refill: 0  4. Gastroesophageal reflux disease, esophagitis presence not specified  - pantoprazole (PROTONIX) 40 MG tablet; Take 1  tablet (40 mg total) by mouth 2 (two) times daily.  Dispense: 90 tablet; Refill: 0  5. Motion sickness, initial encounter  - scopolamine (TRANSDERM-SCOP, 1.5 MG,) 1 MG/3DAYS; Place 1 patch (1.5 mg total) onto the skin every 3 (three) days.  Dispense: 10 patch; Refill: 1   Pankaj Haack Asad A. [facility] 01/24/2015 8:19 AM

## 2015-01-30 ENCOUNTER — Other Ambulatory Visit: Payer: Self-pay | Admitting: Family Medicine

## 2015-02-09 ENCOUNTER — Other Ambulatory Visit: Payer: Self-pay | Admitting: Obstetrics and Gynecology

## 2015-02-09 DIAGNOSIS — Z1231 Encounter for screening mammogram for malignant neoplasm of breast: Secondary | ICD-10-CM

## 2015-02-14 ENCOUNTER — Ambulatory Visit
Admission: RE | Admit: 2015-02-14 | Discharge: 2015-02-14 | Disposition: A | Discharge status: Home / Self Care | Payer: PRIVATE HEALTH INSURANCE | Source: Ambulatory Visit | Attending: Obstetrics and Gynecology | Admitting: Obstetrics and Gynecology

## 2015-02-14 DIAGNOSIS — Z1231 Encounter for screening mammogram for malignant neoplasm of breast: Secondary | ICD-10-CM

## 2015-02-17 ENCOUNTER — Ambulatory Visit: Payer: PRIVATE HEALTH INSURANCE | Admitting: Family Medicine

## 2015-03-01 ENCOUNTER — Ambulatory Visit: Payer: PRIVATE HEALTH INSURANCE | Admitting: Family Medicine

## 2015-03-02 ENCOUNTER — Ambulatory Visit (INDEPENDENT_AMBULATORY_CARE_PROVIDER_SITE_OTHER): Payer: PRIVATE HEALTH INSURANCE | Admitting: Family Medicine

## 2015-03-02 ENCOUNTER — Encounter: Payer: Self-pay | Admitting: Family Medicine

## 2015-03-02 VITALS — BP 118/64 | HR 79 | Temp 98.4°F | Resp 16 | Ht 70.0 in | Wt 179.4 lb

## 2015-03-02 DIAGNOSIS — K219 Gastro-esophageal reflux disease without esophagitis: Secondary | ICD-10-CM | POA: Diagnosis not present

## 2015-03-02 DIAGNOSIS — F4001 Agoraphobia with panic disorder: Secondary | ICD-10-CM

## 2015-03-02 DIAGNOSIS — Z23 Encounter for immunization: Secondary | ICD-10-CM | POA: Diagnosis not present

## 2015-03-02 DIAGNOSIS — M5410 Radiculopathy, site unspecified: Secondary | ICD-10-CM

## 2015-03-02 DIAGNOSIS — G8929 Other chronic pain: Secondary | ICD-10-CM

## 2015-03-02 DIAGNOSIS — M5416 Radiculopathy, lumbar region: Secondary | ICD-10-CM

## 2015-03-02 MED ORDER — ALPRAZOLAM 1 MG PO TABS: 1.0000 mg | ORAL_TABLET | Freq: Two times a day (BID) | ORAL | Status: DC | PRN

## 2015-03-02 MED ORDER — OXYCODONE-ACETAMINOPHEN 10-325 MG PO TABS
1.0000 | ORAL_TABLET | Freq: Two times a day (BID) | ORAL | Status: DC | PRN
Start: 2015-03-02 — End: 2015-03-28

## 2015-03-02 MED ORDER — PANTOPRAZOLE SODIUM 40 MG PO TBEC: 40.0000 mg | DELAYED_RELEASE_TABLET | Freq: Two times a day (BID) | ORAL | Status: DC

## 2015-03-02 NOTE — Progress Notes (Signed)
Name: Kiara Le   MRN: [id number]    DOB: May 06, 1976   Date:03/02/2015       Progress Note  Subjective  Chief Complaint  Chief Complaint  Patient presents with  . Gastrophageal Reflux    pt here for medication refills  . Anxiety  . Pain   Anxiety Presents for follow-up visit. The problem has been unchanged. Symptoms include chest pain, excessive worry, insomnia, nervous/anxious behavior and panic. Patient reports no malaise or nausea. Symptoms occur most days. The severity of symptoms is moderate. The symptoms are aggravated by family issues.   Past treatments include benzodiazephines. The treatment provided significant relief. Compliance with prior treatments has been good.  Back Pain This is a chronic problem. The problem occurs daily. The problem is unchanged. The pain is present in the lumbar spine and gluteal. The quality of the pain is described as aching. The pain is at a severity of 4/10. The pain is moderate. The symptoms are aggravated by bending. Associated symptoms include chest pain. Pertinent negatives include no bladder incontinence, bowel incontinence, numbness or paresis. She has tried analgesics for the symptoms.  Gastrophageal Reflux She complains of chest pain and heartburn. She reports no coughing, no nausea or no sore throat. This is a chronic problem. The problem has been unchanged. Pertinent negatives include no anemia. Risk factors include obesity (s/p bariatric surgery). She has tried a PPI for the symptoms. The treatment provided significant relief.    Past Medical History  Diagnosis Date  . Hypertension   . Hyperlipidemia   . Diabetes mellitus without complication   . Asthma   . Insomnia   . History of Helicobacter pylori infection   . Vitamin B12 deficiency   . Vitamin D deficiency   . Morbid obesity   . Panic disorder   . Anxiety   . Fatty liver disease, nonalcoholic   . Chronic low back pain   . Chronic fatigue     Past Surgical History    Procedure Laterality Date  . Tubal ligation    . Partial hysterectomy    . Gastroplasty duodenal switch    . Salpingectomy      Family History  Problem Relation Age of Onset  . Heart disease Father   . Hypertension Father   . Hyperlipidemia Father   . Depression Sister   . Thyroid disease Sister     Social History   Social History  . Marital Status: Married    Spouse Name: Michiel Cowboy  . Number of Children: 2  . Years of Education: N/A   Occupational History  . Customer Service    Social History Main Topics  . Smoking status: Never Smoker   . Smokeless tobacco: Not on file  . Alcohol Use: No  . Drug Use: No  . Sexual Activity: Not on file   Other Topics Concern  . Not on file   Social History Narrative     Current outpatient prescriptions:  .  ALPRAZolam (XANAX) 1 MG tablet, Take 1 tablet (1 mg total) by mouth 2 (two) times daily as needed for anxiety., Disp: 60 tablet, Rfl: 0 .  diclofenac sodium (VOLTAREN) 1 % GEL, Apply 4 g topically daily., Disp: 100 g, Rfl: 0 .  ergocalciferol (VITAMIN D2) 50000 UNITS capsule, Take 1 capsule by mouth once a week., Disp: , Rfl:  .  glucose blood (ACCU-CHEK AVIVA PLUS) test strip, 1 strip by Does not apply route daily., Disp: , Rfl:  .  Multiple Vitamin (  MULTIVITAMIN) tablet, Take 2 tablets by mouth daily., Disp: , Rfl:  .  NON FORMULARY, Vitamin A+D+E+K 1 tablet daily., Disp: , Rfl:  .  oxyCODONE-acetaminophen (PERCOCET) 10-325 MG per tablet, Take 1 tablet by mouth 2 (two) times daily as needed for pain., Disp: 60 tablet, Rfl: 0 .  pantoprazole (PROTONIX) 40 MG tablet, Take 1 tablet (40 mg total) by mouth 2 (two) times daily., Disp: 90 tablet, Rfl: 0 .  scopolamine (TRANSDERM-SCOP, 1.5 MG,) 1 MG/3DAYS, Place 1 patch (1.5 mg total) onto the skin every 3 (three) days., Disp: 10 patch, Rfl: 1  Allergies  Allergen Reactions  . 2,4-D Dimethylamine (Amisol) Swelling  . Duloxetine Hcl     constipation  . Fluoxetine     severely  depressed sexual function  . Nsaids Other (See Comments)    Cannot take due to abd surgery  . Tetanus Toxoids     Swelling, hot   Review of Systems  HENT: Negative for sore throat.   Respiratory: Negative for cough.   Cardiovascular: Positive for chest pain.  Gastrointestinal: Positive for heartburn. Negative for nausea and bowel incontinence.  Genitourinary: Negative for bladder incontinence.  Musculoskeletal: Positive for myalgias (muscle cramps in calf and feet) and back pain.  Neurological: Negative for numbness.  Psychiatric/Behavioral: Negative for depression. The patient is nervous/anxious and has insomnia.     Objective  Filed Vitals:   03/02/15 1017  BP: 118/64  Pulse: 79  Temp: 98.4 F (36.9 C)  Resp: 16  Height: 5\' 10"  (1.778 m)  Weight: 179 lb 7 oz (81.392 kg)  SpO2: 96%    Physical Exam  Constitutional: She is oriented to person, place, and time and well-developed, well-nourished, and in no distress.  HENT:  Head: Normocephalic and atraumatic.  Cardiovascular: Normal rate and regular rhythm.   Pulmonary/Chest: Effort normal and breath sounds normal.  Abdominal: Soft. Bowel sounds are normal.  Musculoskeletal:       Lumbar back: She exhibits tenderness, pain and spasm.       Back:  Neurological: She is alert and oriented to person, place, and time.  Psychiatric: Memory, affect and judgment normal.  Nursing note and vitals reviewed.  Assessment & Plan  1. Need for influenza vaccination  - Flu Vaccine QUAD 36+ mos PF IM (Fluarix & Fluzone Quad PF)  2. Gastroesophageal reflux disease, esophagitis presence not specified  - pantoprazole (PROTONIX) 40 MG tablet; Take 1 tablet (40 mg total) by mouth 2 (two) times daily.  Dispense: 180 tablet; Refill: 0  3. Chronic radicular low back pain Stable on chronic opioid therapy. Patient aware of the dependence potential and side effects of opioids. Compliant with controlled substances agreement. -  oxyCODONE-acetaminophen (PERCOCET) 10-325 MG tablet; Take 1 tablet by mouth 2 (two) times daily as needed for pain.  Dispense: 60 tablet; Refill: 0  4. Panic disorder with agoraphobia and moderate panic attacks  - ALPRAZolam (XANAX) 1 MG tablet; Take 1 tablet (1 mg total) by mouth 2 (two) times daily as needed for anxiety.  Dispense: 60 tablet; Refill: 0    Syed Asad A[facility] 03/02/2015 10:42 AM

## 2015-03-03 ENCOUNTER — Ambulatory Visit: Payer: PRIVATE HEALTH INSURANCE | Admitting: Family Medicine

## 2015-03-28 ENCOUNTER — Encounter: Payer: Self-pay | Admitting: Family Medicine

## 2015-03-28 ENCOUNTER — Ambulatory Visit (INDEPENDENT_AMBULATORY_CARE_PROVIDER_SITE_OTHER): Payer: PRIVATE HEALTH INSURANCE | Admitting: Family Medicine

## 2015-03-28 VITALS — BP 118/62 | HR 76 | Temp 98.0°F | Resp 16 | Ht 70.0 in | Wt 177.9 lb

## 2015-03-28 DIAGNOSIS — T753XXA Motion sickness, initial encounter: Secondary | ICD-10-CM | POA: Diagnosis not present

## 2015-03-28 DIAGNOSIS — F4001 Agoraphobia with panic disorder: Secondary | ICD-10-CM

## 2015-03-28 DIAGNOSIS — G8929 Other chronic pain: Secondary | ICD-10-CM | POA: Diagnosis not present

## 2015-03-28 DIAGNOSIS — M5410 Radiculopathy, site unspecified: Secondary | ICD-10-CM

## 2015-03-28 DIAGNOSIS — M5416 Radiculopathy, lumbar region: Principal | ICD-10-CM

## 2015-03-28 MED ORDER — SCOPOLAMINE 1 MG/3DAYS TD PT72: 1.0000 | MEDICATED_PATCH | TRANSDERMAL | Status: DC

## 2015-03-28 MED ORDER — OXYCODONE-ACETAMINOPHEN 10-325 MG PO TABS
1.0000 | ORAL_TABLET | Freq: Two times a day (BID) | ORAL | Status: DC | PRN
Start: 2015-03-28 — End: 2015-05-02

## 2015-03-28 MED ORDER — ALPRAZOLAM 1 MG PO TABS: 1.0000 mg | ORAL_TABLET | Freq: Two times a day (BID) | ORAL | Status: DC | PRN

## 2015-03-28 NOTE — Progress Notes (Signed)
Name: Kiara Le   MRN: [id number]    DOB: 1975-07-26   Date:03/28/2015       Progress Note  Subjective  Chief Complaint  Chief Complaint  Patient presents with  . Medication Refill    1 month F/U  . Anxiety    Unchanged  . Back Pain    Unchanged and lower back and right knee pain, constant  . Nausea    Needs refill of patches for nausea    Anxiety Presents for follow-up visit. Symptoms include excessive worry, irritability, nervous/anxious behavior and panic. Primary symptoms comment: nervous and uncomfortable around a group of people..   Past treatments include benzodiazephines.  Back Pain This is a chronic problem. The problem is unchanged. The pain is present in the lumbar spine. The pain is at a severity of 4/10. Treatments tried: Opioids.    Past Medical History  Diagnosis Date  . Hypertension   . Hyperlipidemia   . Diabetes mellitus without complication ([place])   . Asthma   . Insomnia   . History of Helicobacter pylori infection   . Vitamin B12 deficiency   . Vitamin D deficiency   . Morbid obesity ([place])   . Panic disorder   . Anxiety   . Fatty liver disease, nonalcoholic   . Chronic low back pain   . Chronic fatigue     Past Surgical History  Procedure Laterality Date  . Tubal ligation    . Partial hysterectomy    . Gastroplasty duodenal switch    . Salpingectomy      Family History  Problem Relation Age of Onset  . Heart disease Father   . Hypertension Father   . Hyperlipidemia Father   . Depression Sister   . Thyroid disease Sister     Social History   Social History  . Marital Status: Married    Spouse Name: Michiel Cowboy  . Number of Children: 2  . Years of Education: N/A   Occupational History  . Customer Service    Social History Main Topics  . Smoking status: Never Smoker   . Smokeless tobacco: Never Used  . Alcohol Use: No  . Drug Use: No  . Sexual Activity:    Partners: Male   Other Topics Concern  . Not on file   Social  History Narrative     Current outpatient prescriptions:  .  ALPRAZolam (XANAX) 1 MG tablet, Take 1 tablet (1 mg total) by mouth 2 (two) times daily as needed for anxiety., Disp: 60 tablet, Rfl: 0 .  diclofenac sodium (VOLTAREN) 1 % GEL, Apply 4 g topically daily., Disp: 100 g, Rfl: 0 .  ergocalciferol (VITAMIN D2) 50000 UNITS capsule, Take 1 capsule by mouth once a week., Disp: , Rfl:  .  glucose blood (ACCU-CHEK AVIVA PLUS) test strip, 1 strip by Does not apply route daily., Disp: , Rfl:  .  Multiple Vitamin (MULTIVITAMIN) tablet, Take 2 tablets by mouth daily., Disp: , Rfl:  .  NON FORMULARY, Vitamin A+D+E+K 1 tablet daily., Disp: , Rfl:  .  oxyCODONE-acetaminophen (PERCOCET) 10-325 MG tablet, Take 1 tablet by mouth 2 (two) times daily as needed for pain., Disp: 60 tablet, Rfl: 0 .  pantoprazole (PROTONIX) 40 MG tablet, Take 1 tablet (40 mg total) by mouth 2 (two) times daily., Disp: 180 tablet, Rfl: 0 .  scopolamine (TRANSDERM-SCOP, 1.5 MG,) 1 MG/3DAYS, Place 1 patch (1.5 mg total) onto the skin every 3 (three) days., Disp: 10 patch, Rfl: 1  Allergies  Allergen Reactions  . 2,4-D Dimethylamine (Amisol) Swelling  . Duloxetine Hcl     constipation  . Fluoxetine     severely depressed sexual function  . Nsaids Other (See Comments)    Cannot take due to abd surgery  . Tetanus Toxoids     Swelling, hot   Review of Systems  Constitutional: Positive for irritability.  Musculoskeletal: Positive for back pain.  Psychiatric/Behavioral: The patient is nervous/anxious.     Objective  Filed Vitals:   03/28/15 0849  BP: 118/62  Pulse: 76  Temp: 98 F (36.7 C)  TempSrc: Oral  Resp: 16  Height: 5\' 10"  (1.778 m)  Weight: 177 lb 14.4 oz (80.695 kg)  SpO2: 97%    Physical Exam  Constitutional: She is oriented to person, place, and time and well-developed, well-nourished, and in no distress.  Cardiovascular: Normal rate, regular rhythm and normal heart sounds.   Pulmonary/Chest:  Effort normal and breath sounds normal. She has no wheezes.  Musculoskeletal:       Lumbar back: She exhibits tenderness. She exhibits normal range of motion, no pain and no spasm.  Neurological: She is alert and oriented to person, place, and time.  Nursing note and vitals reviewed.   Assessment & Plan  1. Chronic radicular low back pain Chronic low back pain stable and controlled on present opioid therapy. Patient compliant with controlled substances agreement. - oxyCODONE-acetaminophen (PERCOCET) 10-325 MG tablet; Take 1 tablet by mouth 2 (two) times daily as needed for pain.  Dispense: 60 tablet; Refill: 0  2. Motion sickness, initial encounter  - scopolamine (TRANSDERM-SCOP, 1.5 MG,) 1 MG/3DAYS; Place 1 patch (1.5 mg total) onto the skin every 3 (three) days.  Dispense: 10 patch; Refill: 1  3. Panic disorder with agoraphobia and moderate panic attacks  Symptoms stable and controlled on present therapy. Refills provided. - ALPRAZolam (XANAX) 1 MG tablet; Take 1 tablet (1 mg total) by mouth 2 (two) times daily as needed for anxiety.  Dispense: 60 tablet; Refill: 0   Shandee Jergens Asad A[facility] 03/28/2015 8:55 AM

## 2015-05-02 ENCOUNTER — Ambulatory Visit (INDEPENDENT_AMBULATORY_CARE_PROVIDER_SITE_OTHER): Payer: PRIVATE HEALTH INSURANCE | Admitting: Family Medicine

## 2015-05-02 ENCOUNTER — Encounter: Payer: Self-pay | Admitting: Family Medicine

## 2015-05-02 VITALS — BP 118/70 | HR 88 | Temp 98.7°F | Resp 16 | Ht 70.0 in | Wt 177.9 lb

## 2015-05-02 DIAGNOSIS — M5410 Radiculopathy, site unspecified: Secondary | ICD-10-CM

## 2015-05-02 DIAGNOSIS — G8929 Other chronic pain: Secondary | ICD-10-CM

## 2015-05-02 DIAGNOSIS — K219 Gastro-esophageal reflux disease without esophagitis: Secondary | ICD-10-CM | POA: Diagnosis not present

## 2015-05-02 DIAGNOSIS — M5416 Radiculopathy, lumbar region: Secondary | ICD-10-CM

## 2015-05-02 DIAGNOSIS — F4001 Agoraphobia with panic disorder: Secondary | ICD-10-CM

## 2015-05-02 MED ORDER — OXYCODONE-ACETAMINOPHEN 10-325 MG PO TABS: 1.0000 | ORAL_TABLET | Freq: Two times a day (BID) | ORAL | Status: DC | PRN

## 2015-05-02 MED ORDER — ALPRAZOLAM 1 MG PO TABS: 1.0000 mg | ORAL_TABLET | Freq: Two times a day (BID) | ORAL | Status: DC | PRN

## 2015-05-02 MED ORDER — PANTOPRAZOLE SODIUM 40 MG PO TBEC: 40.0000 mg | DELAYED_RELEASE_TABLET | Freq: Two times a day (BID) | ORAL | Status: DC

## 2015-05-02 NOTE — Progress Notes (Signed)
Name: Kiara Le   MRN: [id number]    DOB: [birth year]   Date:05/02/2015       Progress Note  Subjective  Chief Complaint  Chief Complaint  Patient presents with  . Follow-up    1 mo  . Gastroesophageal Reflux  . Anxiety  . Medication Refill    protonix 40mg  / xanax 1mg  / oxycodone     Anxiety Presents for follow-up visit. The problem has been unchanged. Symptoms include chest pain, excessive worry, insomnia, nervous/anxious behavior and panic. Patient reports no malaise or nausea. Symptoms occur most days. The severity of symptoms is moderate. The symptoms are aggravated by family issues.   Past treatments include benzodiazephines. The treatment provided significant relief. Compliance with prior treatments has been good.  Back Pain This is a chronic problem. The problem occurs daily. The problem is unchanged. The pain is present in the lumbar spine and gluteal. The quality of the pain is described as aching. The pain is at a severity of 4/10. The pain is moderate. The symptoms are aggravated by bending. Associated symptoms include chest pain. Pertinent negatives include no abdominal pain, bladder incontinence, bowel incontinence, numbness or paresis. She has tried analgesics for the symptoms.  Gastroesophageal Reflux She complains of chest pain. She reports no abdominal pain, no coughing, no dysphagia, no heartburn, no nausea or no sore throat. This is a chronic problem. The problem has been unchanged. Pertinent negatives include no anemia. Risk factors include obesity (s/p bariatric surgery). She has tried a PPI for the symptoms. The treatment provided significant relief.     Past Medical History  Diagnosis Date  . Hypertension   . Hyperlipidemia   . Diabetes mellitus without complication ([place])   . Asthma   . Insomnia   . History of Helicobacter pylori infection   . Vitamin B12 deficiency   . Vitamin D deficiency   . Morbid obesity ([place])   . Panic disorder   . Anxiety     . Fatty liver disease, nonalcoholic   . Chronic low back pain   . Chronic fatigue     Past Surgical History  Procedure Laterality Date  . Tubal ligation    . Partial hysterectomy    . Gastroplasty duodenal switch    . Salpingectomy      Family History  Problem Relation Age of Onset  . Heart disease Father   . Hypertension Father   . Hyperlipidemia Father   . Depression Sister   . Thyroid disease Sister     Social History   Social History  . Marital Status: Married    Spouse Name: Kiara Le  . Number of Children: 2  . Years of Education: N/A   Occupational History  . Customer Service    Social History Main Topics  . Smoking status: Never Smoker   . Smokeless tobacco: Never Used  . Alcohol Use: No  . Drug Use: No  . Sexual Activity:    Partners: Male   Other Topics Concern  . Not on file   Social History Narrative     Current outpatient prescriptions:  .  ALPRAZolam (XANAX) 1 MG tablet, Take 1 tablet (1 mg total) by mouth 2 (two) times daily as needed for anxiety., Disp: 60 tablet, Rfl: 0 .  diclofenac sodium (VOLTAREN) 1 % GEL, Apply 4 g topically daily., Disp: 100 g, Rfl: 0 .  glucose blood (ACCU-CHEK AVIVA PLUS) test strip, 1 strip by Does not apply route daily., Disp: , Rfl:  .  oxyCODONE-acetaminophen (PERCOCET) 10-325 MG tablet, Take 1 tablet by mouth 2 (two) times daily as needed for pain., Disp: 60 tablet, Rfl: 0 .  pantoprazole (PROTONIX) 40 MG tablet, Take 1 tablet (40 mg total) by mouth 2 (two) times daily., Disp: 180 tablet, Rfl: 0  Allergies  Allergen Reactions  . 2,4-D Dimethylamine (Amisol) Swelling  . Duloxetine Hcl     constipation  . Fluoxetine     severely depressed sexual function  . Nsaids Other (See Comments)    Cannot take due to abd surgery  . Tetanus Toxoids     Swelling, hot     Review of Systems  HENT: Negative for sore throat.   Respiratory: Negative for cough.   Cardiovascular: Positive for chest pain.   Gastrointestinal: Negative for heartburn, dysphagia, nausea, abdominal pain and bowel incontinence.  Genitourinary: Negative for bladder incontinence.  Musculoskeletal: Positive for back pain.  Neurological: Negative for numbness.  Psychiatric/Behavioral: The patient is nervous/anxious and has insomnia.      Objective  Filed Vitals:   05/02/15 1013  BP: 118/70  Pulse: 88  Temp: 98.7 F (37.1 C)  TempSrc: Oral  Resp: 16  Height: 5\' 10"  (1.778 m)  Weight: 177 lb 14.4 oz (80.695 kg)  SpO2: 97%    Physical Exam  Constitutional: She is oriented to person, place, and time and well-developed, well-nourished, and in no distress.  HENT:  Head: Normocephalic and atraumatic.  Cardiovascular: Normal rate and regular rhythm.   Pulmonary/Chest: Effort normal and breath sounds normal.  Abdominal: Soft. Bowel sounds are normal.  Musculoskeletal:       Lumbar back: She exhibits tenderness, pain and spasm.       Back:  Neurological: She is alert and oriented to person, place, and time.  Psychiatric: Memory, affect and judgment normal.  Nursing note and vitals reviewed.   Assessment & Plan  1. Gastroesophageal reflux disease, esophagitis presence not specified Stable on PPI therapy twice a day, approved by patient's bariatric surgeon at [facility]. Refills provided - pantoprazole (PROTONIX) 40 MG tablet; Take 1 tablet (40 mg total) by mouth 2 (two) times daily.  Dispense: 180 tablet; Refill: 0  2. Chronic radicular low back pain Stable on opioid therapy. Patient compliant with controlled substances agreement. Refills provided - oxyCODONE-acetaminophen (PERCOCET) 10-325 MG tablet; Take 1 tablet by mouth 2 (two) times daily as needed for pain.  Dispense: 60 tablet; Refill: 0  3. Panic disorder with agoraphobia and moderate panic attacks Stable on benzodiazepine therapy. Patient compliant with controlled substances agreement and understands the dependence potential, side effects, and drug  interactions of alprazolam. Refills provided - ALPRAZolam (XANAX) 1 MG tablet; Take 1 tablet (1 mg total) by mouth 2 (two) times daily as needed for anxiety.  Dispense: 60 tablet; Refill: 0    Kiara Le A[facility] 05/02/2015 10:28 AM

## 2015-06-02 ENCOUNTER — Ambulatory Visit: Payer: PRIVATE HEALTH INSURANCE | Admitting: Family Medicine

## 2015-06-06 ENCOUNTER — Ambulatory Visit (INDEPENDENT_AMBULATORY_CARE_PROVIDER_SITE_OTHER): Payer: PRIVATE HEALTH INSURANCE | Admitting: Family Medicine

## 2015-06-06 ENCOUNTER — Encounter: Payer: Self-pay | Admitting: Family Medicine

## 2015-06-06 DIAGNOSIS — E559 Vitamin D deficiency, unspecified: Secondary | ICD-10-CM | POA: Insufficient documentation

## 2015-06-06 DIAGNOSIS — F4001 Agoraphobia with panic disorder: Secondary | ICD-10-CM | POA: Diagnosis not present

## 2015-06-06 DIAGNOSIS — M5410 Radiculopathy, site unspecified: Secondary | ICD-10-CM | POA: Diagnosis not present

## 2015-06-06 DIAGNOSIS — G8929 Other chronic pain: Secondary | ICD-10-CM

## 2015-06-06 DIAGNOSIS — M5416 Radiculopathy, lumbar region: Secondary | ICD-10-CM

## 2015-06-06 MED ORDER — ALPRAZOLAM 1 MG PO TABS: 1.0000 mg | ORAL_TABLET | Freq: Two times a day (BID) | ORAL | Status: DC | PRN

## 2015-06-06 MED ORDER — OXYCODONE-ACETAMINOPHEN 10-325 MG PO TABS: 1.0000 | ORAL_TABLET | Freq: Two times a day (BID) | ORAL | Status: DC | PRN

## 2015-06-06 MED ORDER — VITAMIN D (ERGOCALCIFEROL) 1.25 MG (50000 UNIT) PO CAPS: 50000.0000 [IU] | ORAL_CAPSULE | ORAL | Status: DC

## 2015-06-06 NOTE — Progress Notes (Signed)
Name: Kiara Le   MRN: [id number]    DOB: 1975-08-11   Date:06/06/2015       Progress Note  Subjective  Chief Complaint  Chief Complaint  Patient presents with  . Follow-up    1 mo  . Gastroesophageal Reflux  . Anxiety    Anxiety Presents for follow-up visit. The problem has been unchanged. Symptoms include excessive worry, insomnia, nervous/anxious behavior and panic. Patient reports no malaise. Symptoms occur most days. The severity of symptoms is moderate. The symptoms are aggravated by family issues.   Past treatments include benzodiazephines. The treatment provided significant relief. Compliance with prior treatments has been good.  Back Pain This is a chronic problem. The problem occurs daily. The problem is unchanged. The pain is present in the lumbar spine and gluteal. The quality of the pain is described as aching. The pain is at a severity of 4/10. The pain is moderate. The symptoms are aggravated by bending. Pertinent negatives include no bladder incontinence, bowel incontinence, numbness or paresis. She has tried analgesics for the symptoms.   Vitamin D deficiency Pt. Had blood work done at bariatric Surgeon's office on October 31 st, 2017. Showed Vitamin D to be 26 ng/mL. Pt. Has history of Vitamin D deficiency in the past. She had weight loss surgery and is supposed to be on replacement for fat-soluble vitamins. Past Medical History  Diagnosis Date  . Hypertension   . Hyperlipidemia   . Diabetes mellitus without complication ([place])   . Asthma   . Insomnia   . History of Helicobacter pylori infection   . Vitamin B12 deficiency   . Vitamin D deficiency   . Morbid obesity ([place])   . Panic disorder   . Anxiety   . Fatty liver disease, nonalcoholic   . Chronic low back pain   . Chronic fatigue     Past Surgical History  Procedure Laterality Date  . Tubal ligation    . Partial hysterectomy    . Gastroplasty duodenal switch    . Salpingectomy      Family  History  Problem Relation Age of Onset  . Heart disease Father   . Hypertension Father   . Hyperlipidemia Father   . Depression Sister   . Thyroid disease Sister     Social History   Social History  . Marital Status: Married    Spouse Name: Michiel Cowboy  . Number of Children: 2  . Years of Education: N/A   Occupational History  . Customer Service    Social History Main Topics  . Smoking status: Never Smoker   . Smokeless tobacco: Never Used  . Alcohol Use: No  . Drug Use: No  . Sexual Activity:    Partners: Male   Other Topics Concern  . Not on file   Social History Narrative     Current outpatient prescriptions:  .  ALPRAZolam (XANAX) 1 MG tablet, Take 1 tablet (1 mg total) by mouth 2 (two) times daily as needed for anxiety., Disp: 60 tablet, Rfl: 0 .  diclofenac sodium (VOLTAREN) 1 % GEL, Apply 4 g topically daily., Disp: 100 g, Rfl: 0 .  glucose blood (ACCU-CHEK AVIVA PLUS) test strip, 1 strip by Does not apply route daily., Disp: , Rfl:  .  Multiple Vitamin (MULTI-VITAMINS) TABS, Take by mouth., Disp: , Rfl:  .  oxyCODONE-acetaminophen (PERCOCET) 10-325 MG tablet, Take 1 tablet by mouth 2 (two) times daily as needed for pain., Disp: 60 tablet, Rfl: 0 .  pantoprazole (PROTONIX) 40 MG tablet, Take 1 tablet (40 mg total) by mouth 2 (two) times daily., Disp: 180 tablet, Rfl: 0  Allergies  Allergen Reactions  . 2,4-D Dimethylamine (Amisol) Swelling  . Duloxetine Hcl     constipation  . Fluoxetine     severely depressed sexual function  . Nsaids Other (See Comments)    Cannot take due to abd surgery  . Tetanus Toxoids     Swelling, hot     Review of Systems  Gastrointestinal: Negative for bowel incontinence.  Genitourinary: Negative for bladder incontinence.  Musculoskeletal: Positive for back pain.  Neurological: Negative for numbness.  Psychiatric/Behavioral: The patient is nervous/anxious and has insomnia.      Objective  Filed Vitals:   06/06/15 1441    BP: 118/68  Pulse: 79  Temp: 98.1 F (36.7 C)  TempSrc: Oral  Resp: 17  Height: 5\' 10"  (1.778 m)  Weight: 177 lb 14.4 oz (80.695 kg)  SpO2: 98%    Physical Exam  Constitutional: She is oriented to person, place, and time and well-developed, well-nourished, and in no distress.  HENT:  Head: Normocephalic and atraumatic.  Cardiovascular: Normal rate and regular rhythm.   Pulmonary/Chest: Effort normal and breath sounds normal.  Abdominal: Soft. Bowel sounds are normal.  Musculoskeletal:       Lumbar back: She exhibits tenderness, pain and spasm.       Back:  Neurological: She is alert and oriented to person, place, and time.  Psychiatric: Memory, affect and judgment normal.  Nursing note and vitals reviewed.   Assessment & Plan  1. Vitamin D insufficiency Lab results from patient's bariatric surgeon's office reviewed.we'll start on 50,000 units every week  8 weeks. Recheck in 8 weeks. - Vitamin D, Ergocalciferol, (DRISDOL) 50000 units CAPS capsule; Take 1 capsule (50,000 Units total) by mouth every 7 (seven) days.  Dispense: 8 capsule; Refill: 0  2. Chronic radicular low back pain Chronic low back pain, responsive to opioid therapy. Patient compliant with controlled substances agreement. Refills provided. - oxyCODONE-acetaminophen (PERCOCET) 10-325 MG tablet; Take 1 tablet by mouth 2 (two) times daily as needed for pain.  Dispense: 60 tablet; Refill: 0  3. Panic disorder with agoraphobia and moderate panic attacks Symptoms stable on daily benzodiazepine therapy. - ALPRAZolam (XANAX) 1 MG tablet; Take 1 tablet (1 mg total) by mouth 2 (two) times daily as needed for anxiety.  Dispense: 60 tablet; Refill: 0   Samvel Zinn Asad A[facility] 06/06/2015 2:50 PM

## 2015-07-06 ENCOUNTER — Ambulatory Visit (INDEPENDENT_AMBULATORY_CARE_PROVIDER_SITE_OTHER): Payer: PRIVATE HEALTH INSURANCE | Admitting: Family Medicine

## 2015-07-06 ENCOUNTER — Encounter: Payer: Self-pay | Admitting: Family Medicine

## 2015-07-06 DIAGNOSIS — F4001 Agoraphobia with panic disorder: Secondary | ICD-10-CM | POA: Diagnosis not present

## 2015-07-06 DIAGNOSIS — G8929 Other chronic pain: Secondary | ICD-10-CM

## 2015-07-06 DIAGNOSIS — M545 Low back pain: Secondary | ICD-10-CM

## 2015-07-06 DIAGNOSIS — K529 Noninfective gastroenteritis and colitis, unspecified: Secondary | ICD-10-CM

## 2015-07-06 MED ORDER — ALPRAZOLAM 1 MG PO TABS: 1.0000 mg | ORAL_TABLET | Freq: Two times a day (BID) | ORAL | Status: DC | PRN

## 2015-07-06 MED ORDER — OXYCODONE-ACETAMINOPHEN 10-325 MG PO TABS: 1.0000 | ORAL_TABLET | Freq: Two times a day (BID) | ORAL | Status: DC | PRN

## 2015-07-06 NOTE — Progress Notes (Signed)
Name: Kiara Le   MRN: [id number]    DOB: 1975/09/23   Date:07/06/2015       Progress Note  Subjective  Chief Complaint  Chief Complaint  Patient presents with  . Follow-up    1 month  . Pain    back  . Dizziness    Onset 2 days with fatigue and diarrhea.    Dizziness This is a new problem. The current episode started yesterday. Associated symptoms include a change in bowel habit and fatigue. Pertinent negatives include no chest pain (has chest pain when she experiences a panic attack), chills, congestion, coughing, fever, headaches, nausea or vomiting. Associated symptoms comments: Has been experiencing watery, non-bloody diarrhea for 2 days, started after she ate chicken tenders from [employer]. She went 6-7 times yesterday.. She has tried drinking Chief Operating [employer] and Gatorade. ) for the symptoms.  Back Pain This is a chronic problem. The pain is present in the lumbar spine. The pain does not radiate. The pain is at a severity of 3/10. The symptoms are aggravated by lying down, sitting and standing. Pertinent negatives include no bladder incontinence, bowel incontinence, chest pain (has chest pain when she experiences a panic attack), dysuria, fever or headaches. She has tried analgesics for the symptoms.  Anxiety Presents for follow-up visit. Symptoms include depressed mood, dizziness, nervous/anxious behavior and panic. Patient reports no chest pain (has chest pain when she experiences a panic attack), nausea or shortness of breath. The quality of sleep is good.   Her past medical history is significant for anxiety/panic attacks. Past treatments include benzodiazephines.     Past Medical History  Diagnosis Date  . Hypertension   . Hyperlipidemia   . Diabetes mellitus without complication ([place])   . Asthma   . Insomnia   . History of Helicobacter pylori infection   . Vitamin B12 deficiency   . Vitamin D deficiency   . Morbid obesity ([place])   . Panic disorder   .  Anxiety   . Fatty liver disease, nonalcoholic   . Chronic low back pain   . Chronic fatigue     Past Surgical History  Procedure Laterality Date  . Tubal ligation    . Partial hysterectomy    . Gastroplasty duodenal switch    . Salpingectomy      Family History  Problem Relation Age of Onset  . Heart disease Father   . Hypertension Father   . Hyperlipidemia Father   . Depression Sister   . Thyroid disease Sister     Social History   Social History  . Marital Status: Married    Spouse Name: Michiel Cowboy  . Number of Children: 2  . Years of Education: N/A   Occupational History  . Customer Service    Social History Main Topics  . Smoking status: Never Smoker   . Smokeless tobacco: Never Used  . Alcohol Use: No  . Drug Use: No  . Sexual Activity:    Partners: Male   Other Topics Concern  . Not on file   Social History Narrative     Current outpatient prescriptions:  .  ALPRAZolam (XANAX) 1 MG tablet, Take 1 tablet (1 mg total) by mouth 2 (two) times daily as needed for anxiety., Disp: 60 tablet, Rfl: 0 .  diclofenac sodium (VOLTAREN) 1 % GEL, Apply 4 g topically daily., Disp: 100 g, Rfl: 0 .  glucose blood (ACCU-CHEK AVIVA PLUS) test strip, 1 strip by Does not apply route daily.,  Disp: , Rfl:  .  Multiple Vitamin (MULTI-VITAMINS) TABS, Take by mouth., Disp: , Rfl:  .  oxyCODONE-acetaminophen (PERCOCET) 10-325 MG tablet, Take 1 tablet by mouth 2 (two) times daily as needed for pain., Disp: 60 tablet, Rfl: 0 .  pantoprazole (PROTONIX) 40 MG tablet, Take 1 tablet (40 mg total) by mouth 2 (two) times daily., Disp: 180 tablet, Rfl: 0 .  Vitamin D, Ergocalciferol, (DRISDOL) 50000 units CAPS capsule, Take 1 capsule (50,000 Units total) by mouth every 7 (seven) days., Disp: 8 capsule, Rfl: 0  Allergies  Allergen Reactions  . 2,4-D Dimethylamine (Amisol) Swelling  . Duloxetine Hcl     constipation  . Fluoxetine     severely depressed sexual function  . Nsaids Other (See  Comments)    Cannot take due to abd surgery  . Tetanus Toxoids     Swelling, hot     Review of Systems  Constitutional: Positive for malaise/fatigue and fatigue. Negative for fever and chills.  HENT: Negative for congestion.   Respiratory: Negative for cough and shortness of breath.   Cardiovascular: Negative for chest pain (has chest pain when she experiences a panic attack).  Gastrointestinal: Positive for diarrhea and change in bowel habit. Negative for nausea, vomiting, blood in stool and bowel incontinence.  Genitourinary: Negative for bladder incontinence, dysuria and frequency.  Musculoskeletal: Positive for back pain.  Neurological: Positive for dizziness. Negative for headaches.  Psychiatric/Behavioral: Negative for depression. The patient is nervous/anxious.     Objective  Filed Vitals:   07/06/15 0855  BP: 116/68  Pulse: 77  Temp: 98.3 F (36.8 C)  TempSrc: Oral  Resp: 14  Height: 5\' 10"  (1.778 m)  Weight: 178 lb (80.74 kg)  SpO2: 97%    Physical Exam  Constitutional: She is oriented to person, place, and time and well-developed, well-nourished, and in no distress.  HENT:  Head: Normocephalic and atraumatic.  Cardiovascular: Normal rate and regular rhythm.   No murmur heard. Pulmonary/Chest: Effort normal and breath sounds normal. She has no wheezes.  Abdominal: Soft. Bowel sounds are normal. There is no tenderness.  Musculoskeletal:       Lumbar back: She exhibits tenderness and pain.  Neurological: She is alert and oriented to person, place, and time.  Psychiatric: Mood, memory, affect and judgment normal.  Nursing note and vitals reviewed.   Assessment & Plan  1. Panic disorder with agoraphobia and moderate panic attacks Stable and responsive to therapy. Refills provided. - ALPRAZolam (XANAX) 1 MG tablet; Take 1 tablet (1 mg total) by mouth 2 (two) times daily as needed for anxiety.  Dispense: 60 tablet; Refill: 0  2. Enteritis Likely infectious,  recommending increased fluid intake. Patient will contact the bariatric surgeon at [facility] to determine if Imodium is appropriate for her symptoms. Advised to seek immediate medical attention if her symptoms get worse. - CBC with Differential - Comprehensive Metabolic Panel (CMET)  3. Chronic right-sided low back pain without sciatica Stable and responsive to opioid therapy. Refills provided - oxyCODONE-acetaminophen (PERCOCET) 10-325 MG tablet; Take 1 tablet by mouth 2 (two) times daily as needed for pain.  Dispense: 60 tablet; Refill: 0  Jolette Lana Asad A. [facility] 07/06/2015 9:05 AM

## 2015-08-02 ENCOUNTER — Ambulatory Visit (INDEPENDENT_AMBULATORY_CARE_PROVIDER_SITE_OTHER): Payer: PRIVATE HEALTH INSURANCE | Admitting: Family Medicine

## 2015-08-02 ENCOUNTER — Encounter: Payer: Self-pay | Admitting: Family Medicine

## 2015-08-02 VITALS — BP 118/75 | HR 83 | Temp 98.7°F | Resp 17 | Ht 70.0 in | Wt 179.2 lb

## 2015-08-02 DIAGNOSIS — G8929 Other chronic pain: Secondary | ICD-10-CM

## 2015-08-02 DIAGNOSIS — M545 Low back pain: Secondary | ICD-10-CM | POA: Diagnosis not present

## 2015-08-02 DIAGNOSIS — E559 Vitamin D deficiency, unspecified: Secondary | ICD-10-CM

## 2015-08-02 DIAGNOSIS — F4001 Agoraphobia with panic disorder: Secondary | ICD-10-CM

## 2015-08-02 MED ORDER — ALPRAZOLAM 1 MG PO TABS: 1.0000 mg | ORAL_TABLET | Freq: Two times a day (BID) | ORAL | Status: DC | PRN

## 2015-08-02 MED ORDER — OXYCODONE-ACETAMINOPHEN 10-325 MG PO TABS: 1.0000 | ORAL_TABLET | Freq: Two times a day (BID) | ORAL | Status: DC | PRN

## 2015-08-02 NOTE — Progress Notes (Signed)
Name: Kiara Le   MRN: [id number]    DOB: 1975-06-18   Date:08/02/2015       Progress Note  Subjective  Chief Complaint  Chief Complaint  Patient presents with  . Follow-up    1 mo  . Gastroesophageal Reflux  . Anxiety  . Medication Refill    xanax 1 mg / oxycodone     Anxiety Presents for follow-up visit. The problem has been unchanged. Symptoms include chest pain, insomnia, irritability, nervous/anxious behavior and panic. Patient reports no suicidal ideas.   Past treatments include benzodiazephines. The treatment provided moderate relief. Compliance with prior treatments has been good.  Back Pain This is a chronic problem. The problem is unchanged. The pain is present in the lumbar spine. The pain does not radiate. The pain is at a severity of 3/10. The pain is mild. Associated symptoms include chest pain. Pertinent negatives include no bladder incontinence, bowel incontinence, fever or leg pain (right knee pain). She has tried analgesics for the symptoms.   Vitamin  D Deficiency Pt. Is here to have her Vitamin D levels rechecked. She has finished a 8-week course of Vitamin D 50,000 units once weekly.   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 and her husband are considering to become foster parents for children displaced from their homes. She is working with [employer] [place] [employer], and is required by the society to have her physician fill out the paperwork certifying to her physical, mental and emotional fitness to be a foster parent. Pt. Has in the past raised her niece for 8 years. She has two grown children. She resides with her husband.  Past Medical History  Diagnosis Date  . Hypertension   . Hyperlipidemia   . Diabetes mellitus without complication ([place])   . Asthma   . Insomnia   . History of Helicobacter pylori infection   . Vitamin B12 deficiency   . Vitamin D deficiency   . Morbid obesity ([place])   . Panic disorder   . Anxiety   . Fatty liver  disease, nonalcoholic   . Chronic low back pain   . Chronic fatigue     Past Surgical History  Procedure Laterality Date  . Tubal ligation    . Partial hysterectomy    . Gastroplasty duodenal switch    . Salpingectomy      Family History  Problem Relation Age of Onset  . Heart disease Father   . Hypertension Father   . Hyperlipidemia Father   . Depression Sister   . Thyroid disease Sister     Social History   Social History  . Marital Status: Married    Spouse Name: Michiel Cowboy  . Number of Children: 2  . Years of Education: N/A   Occupational History  . Customer Service    Social History Main Topics  . Smoking status: Never Smoker   . Smokeless tobacco: Never Used  . Alcohol Use: No  . Drug Use: No  . Sexual Activity:    Partners: Male   Other Topics Concern  . Not on file   Social History Narrative     Current outpatient prescriptions:  .  ALPRAZolam (XANAX) 1 MG tablet, Take 1 tablet (1 mg total) by mouth 2 (two) times daily as needed for anxiety., Disp: 60 tablet, Rfl: 0 .  diclofenac sodium (VOLTAREN) 1 % GEL, Apply 4 g topically daily., Disp: 100 g, Rfl: 0 .  glucose blood (ACCU-CHEK AVIVA PLUS) test strip, 1 strip  by Does not apply route daily., Disp: , Rfl:  .  Multiple Vitamin (MULTI-VITAMINS) TABS, Take by mouth., Disp: , Rfl:  .  oxyCODONE-acetaminophen (PERCOCET) 10-325 MG tablet, Take 1 tablet by mouth 2 (two) times daily as needed for pain., Disp: 60 tablet, Rfl: 0 .  pantoprazole (PROTONIX) 40 MG tablet, Take 1 tablet (40 mg total) by mouth 2 (two) times daily., Disp: 180 tablet, Rfl: 0 .  Vitamin D, Ergocalciferol, (DRISDOL) 50000 units CAPS capsule, Take 1 capsule (50,000 Units total) by mouth every 7 (seven) days. (Patient not taking: Reported on 08/02/2015), Disp: 8 capsule, Rfl: 0  Allergies  Allergen Reactions  . 2,4-D Dimethylamine (Amisol) Swelling  . Duloxetine Hcl     constipation  . Fluoxetine     severely depressed sexual function  .  Nsaids Other (See Comments)    Cannot take due to abd surgery  . Tetanus Toxoids     Swelling, hot     Review of Systems  Constitutional: Positive for irritability. Negative for fever and chills.  Cardiovascular: Positive for chest pain.  Gastrointestinal: Negative for bowel incontinence.  Genitourinary: Negative for bladder incontinence.  Musculoskeletal: Positive for back pain and joint pain.  Psychiatric/Behavioral: Positive for depression. Negative for suicidal ideas. The patient is nervous/anxious and has insomnia.     Objective  Filed Vitals:   08/02/15 1048  BP: 118/75  Pulse: 83  Temp: 98.7 F (37.1 C)  TempSrc: Oral  Resp: 17  Height: 5\' 10"  (1.778 m)  Weight: 179 lb 3.2 oz (81.285 kg)  SpO2: 98%    Physical Exam  Constitutional: She is oriented to person, place, and time and well-developed, well-nourished, and in no distress.  HENT:  Head: Normocephalic and atraumatic.  Cardiovascular: Normal rate and regular rhythm.   Pulmonary/Chest: Effort normal and breath sounds normal.  Abdominal: Soft. Bowel sounds are normal.  Neurological: She is alert and oriented to person, place, and time.  Psychiatric: Mood, memory, affect and judgment normal.  Nursing note and vitals reviewed.    Assessment & Plan  1. Chronic right-sided low back pain without sciatica Stable on chronic opioid therapy. Patient compliant with controlled substances agreement. Refills provided. - oxyCODONE-acetaminophen (PERCOCET) 10-325 MG tablet; Take 1 tablet by mouth 2 (two) times daily as needed for pain.  Dispense: 60 tablet; Refill: 0  2. Panic disorder with agoraphobia and moderate panic attacks Stable on alprazolam taken twice daily as needed. - ALPRAZolam (XANAX) 1 MG tablet; Take 1 tablet (1 mg total) by mouth 2 (two) times daily as needed for anxiety.  Dispense: 60 tablet; Refill: 0  2. Panic disorder with agoraphobia and moderate panic attacks Symptoms of anxiety are stable and  controlled on alprazolam. Refills provided. Have recommended that she should be referred to psychiatry for evaluation and certification of her mental health in order to become foster parents - ALPRAZolam (XANAX) 1 MG tablet; Take 1 tablet (1 mg total) by mouth 2 (two) times daily as needed for anxiety.  Dispense: 60 tablet; Refill: 0 - Ambulatory referral to Psychiatry  3. Vitamin D insufficiency  - Vitamin D (25 hydroxy)   Preston Garabedian Asad A. [facility] 08/02/2015 10:54 AM

## 2015-08-03 LAB — VITAMIN D 25 HYDROXY (VIT D DEFICIENCY, FRACTURES): Vit D, 25-Hydroxy: 20.7 ng/mL — ABNORMAL LOW (ref 30.0–100.0)

## 2015-08-07 ENCOUNTER — Telehealth: Payer: Self-pay

## 2015-08-07 MED ORDER — VITAMIN D (ERGOCALCIFEROL) 1.25 MG (50000 UNIT) PO CAPS: 50000.0000 [IU] | ORAL_CAPSULE | ORAL | Status: DC

## 2015-08-07 NOTE — Telephone Encounter (Signed)
Lab results have been reported to patient and a Prescription for Vitamin D3 50,000 units has been sent to [place] per Dr. Manuella Ghazi. Patient is to take 1 capsule once a week for 12 weeks, she has been notified and patient verbalized understanding

## 2015-08-16 ENCOUNTER — Encounter: Payer: Self-pay | Admitting: Psychiatry

## 2015-08-16 ENCOUNTER — Ambulatory Visit (INDEPENDENT_AMBULATORY_CARE_PROVIDER_SITE_OTHER): Payer: PRIVATE HEALTH INSURANCE | Admitting: Psychiatry

## 2015-08-16 VITALS — BP 110/72 | HR 81 | Temp 97.1°F | Ht 70.0 in | Wt 181.6 lb

## 2015-08-16 DIAGNOSIS — F411 Generalized anxiety disorder: Secondary | ICD-10-CM

## 2015-08-16 DIAGNOSIS — F39 Unspecified mood [affective] disorder: Secondary | ICD-10-CM | POA: Diagnosis not present

## 2015-08-16 MED ORDER — BUSPIRONE HCL 5 MG PO TABS: 5.0000 mg | ORAL_TABLET | Freq: Two times a day (BID) | ORAL | Status: DC
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
## 2015-09-01 ENCOUNTER — Ambulatory Visit (INDEPENDENT_AMBULATORY_CARE_PROVIDER_SITE_OTHER): Payer: PRIVATE HEALTH INSURANCE | Admitting: Family Medicine

## 2015-09-01 ENCOUNTER — Encounter: Payer: Self-pay | Admitting: Family Medicine

## 2015-09-01 VITALS — BP 111/70 | HR 77 | Temp 98.6°F | Resp 16 | Ht 70.0 in | Wt 178.2 lb

## 2015-09-01 DIAGNOSIS — M129 Arthropathy, unspecified: Secondary | ICD-10-CM

## 2015-09-01 DIAGNOSIS — M545 Low back pain, unspecified: Secondary | ICD-10-CM

## 2015-09-01 DIAGNOSIS — G8929 Other chronic pain: Secondary | ICD-10-CM | POA: Diagnosis not present

## 2015-09-01 DIAGNOSIS — F4001 Agoraphobia with panic disorder: Secondary | ICD-10-CM | POA: Diagnosis not present

## 2015-09-01 DIAGNOSIS — M1711 Unilateral primary osteoarthritis, right knee: Secondary | ICD-10-CM

## 2015-09-01 MED ORDER — DICLOFENAC SODIUM 1 % TD GEL: 4.0000 g | Freq: Every day | TRANSDERMAL | Status: DC

## 2015-09-01 MED ORDER — ALPRAZOLAM 1 MG PO TABS: 1.0000 mg | ORAL_TABLET | Freq: Two times a day (BID) | ORAL | Status: DC | PRN

## 2015-09-01 MED ORDER — OXYCODONE-ACETAMINOPHEN 10-325 MG PO TABS: 1.0000 | ORAL_TABLET | Freq: Two times a day (BID) | ORAL | Status: DC | PRN

## 2015-09-01 NOTE — Progress Notes (Signed)
Name: Kiara Le   MRN: [id number]    DOB: 1975/11/24   Date:09/01/2015       Progress Note  Subjective  Chief Complaint  Chief Complaint  Patient presents with  . Follow-up    1 mo  . Medication Refill    xanax 1 mg / oxycodone / diclofenac 4 g    HPI  Anxiety: Pt. Returns for follow up of anxiety, main symptoms include panic attacks , shakes, nervous and anxious, worse with not sleeping well. Pt. Started on Buspar by Dr. Gretel Acre, but not taking it.  Low Back Pain: Pt. Returns for chronic low back pain, pain is rated at 3/10, relieved with Percocet 10-325 mg twice daily as needed.   Knee Arthritis: Bilateral knee pain right worse than left, requesting refill for Voltaren Gel to be applied as needed. Overall better since her metabolic surgery but still flares up at times.   Past Medical History  Diagnosis Date  . Hypertension   . Hyperlipidemia   . Diabetes mellitus without complication ([place])   . Asthma   . Insomnia   . History of Helicobacter pylori infection   . Vitamin B12 deficiency   . Vitamin D deficiency   . Morbid obesity ([place])   . Panic disorder   . Anxiety   . Fatty liver disease, nonalcoholic   . Chronic low back pain   . Chronic fatigue   . PTSD (post-traumatic stress disorder)   . Bipolar disorder [facility])     Past Surgical History  Procedure Laterality Date  . Tubal ligation    . Partial hysterectomy    . Gastroplasty duodenal switch    . Salpingectomy    . Abdominal hysterectomy      Family History  Problem Relation Age of Onset  . Heart disease Father   . Hypertension Father   . Hyperlipidemia Father   . Drug abuse Mother   . Anxiety disorder Sister   . Depression Sister     Social History   Social History  . Marital Status: Married    Spouse Name: Michiel Cowboy  . Number of Children: 2  . Years of Education: N/A   Occupational History  . Customer Service    Social History Main Topics  . Smoking status: Never Smoker   . Smokeless  tobacco: Never Used  . Alcohol Use: No  . Drug Use: No  . Sexual Activity:    Partners: Male    Birth Control/ Protection: None   Other Topics Concern  . Not on file   Social History Narrative     Current outpatient prescriptions:  .  ALPRAZolam (XANAX) 1 MG tablet, Take 1 tablet (1 mg total) by mouth 2 (two) times daily as needed for anxiety., Disp: 60 tablet, Rfl: 0 .  diclofenac sodium (VOLTAREN) 1 % GEL, Apply 4 g topically daily., Disp: 100 g, Rfl: 0 .  glucose blood (ACCU-CHEK AVIVA PLUS) test strip, 1 strip by Does not apply route daily., Disp: , Rfl:  .  Multiple Vitamin (MULTI-VITAMINS) TABS, Take by mouth., Disp: , Rfl:  .  oxyCODONE-acetaminophen (PERCOCET) 10-325 MG tablet, Take 1 tablet by mouth 2 (two) times daily as needed for pain., Disp: 60 tablet, Rfl: 0 .  pantoprazole (PROTONIX) 40 MG tablet, Take 1 tablet (40 mg total) by mouth 2 (two) times daily., Disp: 180 tablet, Rfl: 0 .  Vitamin D, Ergocalciferol, (DRISDOL) 50000 units CAPS capsule, Take 1 capsule (50,000 Units total) by mouth once a week.  For 12 weeks, Disp: 12 capsule, Rfl: 0  Allergies  Allergen Reactions  . 2,4-D Dimethylamine (Amisol) Swelling  . Nsaids Other (See Comments)    Cannot take due to abd surgery  . Tetanus Toxoids     Swelling, hot     Review of Systems  Musculoskeletal: Positive for back pain and joint pain.  Psychiatric/Behavioral: The patient is nervous/anxious.      Objective  Filed Vitals:   09/01/15 0819  BP: 111/70  Pulse: 77  Temp: 98.6 F (37 C)  TempSrc: Oral  Resp: 16  Height: 5\' 10"  (1.778 m)  Weight: 178 lb 3.2 oz (80.831 kg)  SpO2: 98%    Physical Exam  Constitutional: She is oriented to person, place, and time and well-developed, well-nourished, and in no distress.  Cardiovascular: Normal rate and regular rhythm.   Pulmonary/Chest: Effort normal and breath sounds normal.  Musculoskeletal:       Right knee: She exhibits normal range of motion, no  swelling and no erythema.       Left knee: She exhibits normal range of motion, no swelling and no erythema.  Neurological: She is alert and oriented to person, place, and time.  Psychiatric: Memory, affect and judgment normal.  Nursing note and vitals reviewed.    Assessment & Plan  1. Arthritis of right knee Advised to use sparingly, approved by patient's metabolic surgeon - diclofenac sodium (VOLTAREN) 1 % GEL; Apply 4 g topically daily.  Dispense: 100 g; Refill: 0  2. Chronic right-sided low back pain without sciatica Well controlled on present opioid therapy. Patient compliant with controlled substances agreement, educated on the dependence potential of opioids, their side effects, and drug interactions with benzodiazepines. Advised to take Percocet and alprazolam at least 4 hours apart. - oxyCODONE-acetaminophen (PERCOCET) 10-325 MG tablet; Take 1 tablet by mouth 2 (two) times daily as needed for pain.  Dispense: 60 tablet; Refill: 0  3. Panic disorder with agoraphobia and moderate panic attacks Stable and responsive to alprazolam taken twice daily as needed. Refills provided - ALPRAZolam (XANAX) 1 MG tablet; Take 1 tablet (1 mg total) by mouth 2 (two) times daily as needed for anxiety.  Dispense: 60 tablet; Refill: 0   Simaya Lumadue [facility] 09/01/2015 8:34 AM

## 2015-09-25 ENCOUNTER — Other Ambulatory Visit: Payer: Self-pay | Admitting: Family Medicine

## 2015-09-25 ENCOUNTER — Encounter: Payer: Self-pay | Admitting: Family Medicine

## 2015-09-25 ENCOUNTER — Ambulatory Visit (INDEPENDENT_AMBULATORY_CARE_PROVIDER_SITE_OTHER): Payer: PRIVATE HEALTH INSURANCE | Admitting: Family Medicine

## 2015-09-25 VITALS — BP 110/71 | HR 75 | Temp 99.6°F | Resp 16 | Ht 70.0 in | Wt 181.1 lb

## 2015-09-25 DIAGNOSIS — M545 Low back pain, unspecified: Secondary | ICD-10-CM

## 2015-09-25 DIAGNOSIS — G8929 Other chronic pain: Secondary | ICD-10-CM

## 2015-09-25 DIAGNOSIS — F4001 Agoraphobia with panic disorder: Secondary | ICD-10-CM | POA: Diagnosis not present

## 2015-09-25 MED ORDER — OXYCODONE-ACETAMINOPHEN 10-325 MG PO TABS: 1.0000 | ORAL_TABLET | Freq: Two times a day (BID) | ORAL | Status: DC | PRN

## 2015-09-25 MED ORDER — ALPRAZOLAM 1 MG PO TABS
1.0000 mg | ORAL_TABLET | Freq: Two times a day (BID) | ORAL | Status: DC | PRN
Start: 2015-09-25 — End: 2015-10-25

## 2015-09-25 NOTE — Progress Notes (Signed)
Name: Kiara Le   MRN: [id number]    DOB: December 03, 1975   Date:09/25/2015       Progress Note  Subjective  Chief Complaint  Chief Complaint  Patient presents with  . Follow-up    1 mo  . Medication Refill    xanax 1 mg / oxycodone     HPI  Anxiety: Pt. Returns for follow up of anxiety, main symptoms include panic attacks, shakes, nervous and anxious, worse with not sleeping well (woke up three times last night due to chest tightness), worried about her father-in-law who is not doing well in [place]. She never started the Buspar prescribed by Psychiatrist.   Low Back Pain: Pt. Returns for chronic low back pain, pain is rated at 3/10, relieved with Percocet 10-325 mg twice daily as needed. Will obtain UDS as part of controlled substances agreement today.  Past Medical History  Diagnosis Date  . Hypertension   . Hyperlipidemia   . Diabetes mellitus without complication ([place])   . Asthma   . Insomnia   . History of Helicobacter pylori infection   . Vitamin B12 deficiency   . Vitamin D deficiency   . Morbid obesity ([place])   . Panic disorder   . Anxiety   . Fatty liver disease, nonalcoholic   . Chronic low back pain   . Chronic fatigue   . PTSD (post-traumatic stress disorder)   . Bipolar disorder [facility])     Past Surgical History  Procedure Laterality Date  . Tubal ligation    . Partial hysterectomy    . Gastroplasty duodenal switch    . Salpingectomy    . Abdominal hysterectomy      Family History  Problem Relation Age of Onset  . Heart disease Father   . Hypertension Father   . Hyperlipidemia Father   . Drug abuse Mother   . Anxiety disorder Sister   . Depression Sister     Social History   Social History  . Marital Status: Married    Spouse Name: Michiel Cowboy  . Number of Children: 2  . Years of Education: N/A   Occupational History  . Customer Service    Social History Main Topics  . Smoking status: Never Smoker   . Smokeless tobacco: Never Used  .  Alcohol Use: No  . Drug Use: No  . Sexual Activity:    Partners: Male    Birth Control/ Protection: None   Other Topics Concern  . Not on file   Social History Narrative     Current outpatient prescriptions:  .  ALPRAZolam (XANAX) 1 MG tablet, Take 1 tablet (1 mg total) by mouth 2 (two) times daily as needed for anxiety., Disp: 60 tablet, Rfl: 0 .  diclofenac sodium (VOLTAREN) 1 % GEL, Apply 4 g topically daily., Disp: 100 g, Rfl: 0 .  glucose blood (ACCU-CHEK AVIVA PLUS) test strip, 1 strip by Does not apply route daily., Disp: , Rfl:  .  Multiple Vitamin (MULTI-VITAMINS) TABS, Take by mouth., Disp: , Rfl:  .  oxyCODONE-acetaminophen (PERCOCET) 10-325 MG tablet, Take 1 tablet by mouth 2 (two) times daily as needed for pain., Disp: 60 tablet, Rfl: 0 .  pantoprazole (PROTONIX) 40 MG tablet, Take 1 tablet (40 mg total) by mouth 2 (two) times daily., Disp: 180 tablet, Rfl: 0 .  Vitamin D, Ergocalciferol, (DRISDOL) 50000 units CAPS capsule, Take 1 capsule (50,000 Units total) by mouth once a week. For 12 weeks, Disp: 12 capsule, Rfl: 0  Allergies  Allergen Reactions  . 2,4-D Dimethylamine (Amisol) Swelling  . Nsaids Other (See Comments)    Cannot take due to abd surgery  . Tetanus Toxoids     Swelling, hot     Review of Systems  Musculoskeletal: Positive for back pain.  Psychiatric/Behavioral: The patient is nervous/anxious.     Objective  Filed Vitals:   09/25/15 0832  BP: 110/71  Pulse: 75  Temp: 99.6 F (37.6 C)  TempSrc: Oral  Resp: 16  Height: 5\' 10"  (1.778 m)  Weight: 181 lb 1.6 oz (82.146 kg)  SpO2: 97%    Physical Exam  Constitutional: She is oriented to person, place, and time and well-developed, well-nourished, and in no distress.  Cardiovascular: Normal rate and regular rhythm.   Pulmonary/Chest: Effort normal and breath sounds normal.  Musculoskeletal:       Lumbar back: She exhibits tenderness, pain and spasm.       Back:  Neurological: She is  alert and oriented to person, place, and time.  Psychiatric: Mood, memory, affect and judgment normal.  Nursing note and vitals reviewed.     Assessment & Plan  1. Chronic right-sided low back pain without sciatica Refills for oxycodone provided, we will obtain UDS as part of controlled substances agreement. Patient aware of the dependence potential, side effects, and drug interactions of opioids. - oxyCODONE-acetaminophen (PERCOCET) 10-325 MG tablet; Take 1 tablet by mouth 2 (two) times daily as needed for pain. Please fill on/after October 01, 2015  Dispense: 60 tablet; Refill: 0 - [id number]+Oxyco+Alc+Crt-Bund  2. Panic disorder with agoraphobia and moderate panic attacks Somewhat worse because of the distressing news of her father-in-law's poor health. Continue on alprazolam. Never started BuSpar prescribed by psychiatry. Follow-up in one month. - ALPRAZolam (XANAX) 1 MG tablet; Take 1 tablet (1 mg total) by mouth 2 (two) times daily as needed for anxiety. Please fill on/after October 01, 2015  Dispense: 60 tablet; Refill: 0   Kenai Fluegel [facility] 09/25/2015 8:43 AM

## 2015-10-01 LAB — DRUG SCREEN 764883 11+OXYCO+ALC+CRT-BUND
AMPHETAMINES, URINE: NEGATIVE ng/mL
BENZODIAZ UR QL: NEGATIVE ng/mL
Barbiturate: NEGATIVE ng/mL
CANNABINOID QUANT UR: NEGATIVE ng/mL
CREATININE: 86.3 mg/dL (ref 20.0–300.0)
Cocaine (Metabolite): NEGATIVE ng/mL
Ethanol: NEGATIVE %
MEPERIDINE: NEGATIVE ng/mL
Methadone Screen, Urine: NEGATIVE ng/mL
OPIATE SCREEN URINE: NEGATIVE ng/mL
PH OF URINE: 6.2 (ref 4.5–8.9)
PHENCYCLIDINE: NEGATIVE ng/mL
Propoxyphene: NEGATIVE ng/mL
Tramadol: NEGATIVE ng/mL

## 2015-10-01 LAB — OXYCODONE/OXYMORPHONE, CONFIRM: OXYCODONE/OXYMORPH: NEGATIVE

## 2015-10-25 ENCOUNTER — Encounter: Payer: Self-pay | Admitting: Family Medicine

## 2015-10-25 ENCOUNTER — Ambulatory Visit (INDEPENDENT_AMBULATORY_CARE_PROVIDER_SITE_OTHER): Payer: PRIVATE HEALTH INSURANCE | Admitting: Family Medicine

## 2015-10-25 VITALS — BP 112/70 | HR 78 | Temp 98.6°F | Resp 15 | Ht 70.0 in | Wt 180.9 lb

## 2015-10-25 DIAGNOSIS — M545 Low back pain, unspecified: Secondary | ICD-10-CM

## 2015-10-25 DIAGNOSIS — G8929 Other chronic pain: Secondary | ICD-10-CM

## 2015-10-25 DIAGNOSIS — F4001 Agoraphobia with panic disorder: Secondary | ICD-10-CM | POA: Diagnosis not present

## 2015-10-25 MED ORDER — ALPRAZOLAM 1 MG PO TABS: 1.0000 mg | ORAL_TABLET | Freq: Two times a day (BID) | ORAL | Status: DC | PRN

## 2015-10-25 MED ORDER — OXYCODONE-ACETAMINOPHEN 10-325 MG PO TABS: 1.0000 | ORAL_TABLET | Freq: Two times a day (BID) | ORAL | Status: DC | PRN

## 2015-10-25 NOTE — Progress Notes (Signed)
Name: Kiara Le   MRN: [id number]    DOB: 1975-11-19   Date:10/25/2015       Progress Note  Subjective  Chief Complaint  Chief Complaint  Patient presents with  . Follow-up    1 mo  . Medication Refill    xanax 1 mg / oxycodone    HPI  Chronic Low Back Pain: Pt. Presents for med refills on Percocet 10-325 mg, pain is rated at 4/10, worse in the hip. UDS performed last month was inconsistent, she reports taking Percocet the night before her last office visit. She will be referred to pain management.   Anxiety: Pt. Presents for medication refill on Alprazolam 1 mg two times daily as needed, takes as needed for anxiety. Urine Drug Screen was inconsistent, she reports taking Alprazolam the evening before her last office visit. She will be referred to pain management.  Past Medical History  Diagnosis Date  . Hypertension   . Hyperlipidemia   . Diabetes mellitus without complication ([place])   . Asthma   . Insomnia   . History of Helicobacter pylori infection   . Vitamin B12 deficiency   . Vitamin D deficiency   . Morbid obesity ([place])   . Panic disorder   . Anxiety   . Fatty liver disease, nonalcoholic   . Chronic low back pain   . Chronic fatigue   . PTSD (post-traumatic stress disorder)   . Bipolar disorder [facility])     Past Surgical History  Procedure Laterality Date  . Tubal ligation    . Partial hysterectomy    . Gastroplasty duodenal switch    . Salpingectomy    . Abdominal hysterectomy      Family History  Problem Relation Age of Onset  . Heart disease Father   . Hypertension Father   . Hyperlipidemia Father   . Drug abuse Mother   . Anxiety disorder Sister   . Depression Sister     Social History   Social History  . Marital Status: Married    Spouse Name: Michiel Cowboy  . Number of Children: 2  . Years of Education: N/A   Occupational History  . Customer Service    Social History Main Topics  . Smoking status: Never Smoker   . Smokeless tobacco:  Never Used  . Alcohol Use: No  . Drug Use: No  . Sexual Activity:    Partners: Male    Birth Control/ Protection: None   Other Topics Concern  . Not on file   Social History Narrative     Current outpatient prescriptions:  .  ALPRAZolam (XANAX) 1 MG tablet, Take 1 tablet (1 mg total) by mouth 2 (two) times daily as needed for anxiety. Please fill on/after October 01, 2015, Disp: 60 tablet, Rfl: 0 .  diclofenac sodium (VOLTAREN) 1 % GEL, Apply 4 g topically daily., Disp: 100 g, Rfl: 0 .  glucose blood (ACCU-CHEK AVIVA PLUS) test strip, 1 strip by Does not apply route daily., Disp: , Rfl:  .  Multiple Vitamin (MULTI-VITAMINS) TABS, Take by mouth., Disp: , Rfl:  .  oxyCODONE-acetaminophen (PERCOCET) 10-325 MG tablet, Take 1 tablet by mouth 2 (two) times daily as needed for pain. Please fill on/after October 01, 2015, Disp: 60 tablet, Rfl: 0 .  pantoprazole (PROTONIX) 40 MG tablet, Take 1 tablet (40 mg total) by mouth 2 (two) times daily., Disp: 180 tablet, Rfl: 0  Allergies  Allergen Reactions  . 2,4-D Dimethylamine (Amisol) Swelling  . Nsaids Other (  See Comments)    Cannot take due to abd surgery  . Tetanus Toxoids     Swelling, hot     Review of Systems  Musculoskeletal: Positive for back pain.  Psychiatric/Behavioral: Positive for depression. The patient is nervous/anxious.       Objective  Filed Vitals:   10/25/15 0846  BP: 112/70  Pulse: 78  Temp: 98.6 F (37 C)  TempSrc: Oral  Resp: 15  Height: 5\' 10"  (1.778 m)  Weight: 180 lb 14.4 oz (82.056 kg)  SpO2: 98%    Physical Exam  Constitutional: She is oriented to person, place, and time and well-developed, well-nourished, and in no distress.  Cardiovascular: Normal rate and regular rhythm.   Pulmonary/Chest: Effort normal and breath sounds normal.  Musculoskeletal:       Lumbar back: She exhibits tenderness and pain.       Back:  Neurological: She is alert and oriented to person, place, and time.  Nursing  note and vitals reviewed.      Recent Results (from the past 2160 hour(s))  Vitamin D (25 hydroxy)     Status: Abnormal   Collection Time: 08/02/15 11:24 AM  Result Value Ref Range   Vit D, 25-Hydroxy 20.7 (L) 30.0 - 100.0 ng/mL    Comment: Vitamin D deficiency has been defined by the [place] practice guideline as a level of serum 25-OH vitamin D less than 20 ng/mL (1,2). The Endocrine Society went on to further define vitamin D insufficiency as a level between 21 and 29 ng/mL (2). 1. IOM (Institute of Medicine). 2010. Dietary reference    intakes for calcium and D. [place]: The    [employer]. 2. Holick MF, Binkley [place], Bischoff-Ferrari HA, et al.    Evaluation, treatment, and prevention of vitamin D    deficiency: an Endocrine Society clinical practice    guideline. JCEM. 2011 Jul; 96(7):1911-30.   [id number]+Oxyco+Alc+Crt-Bund     Status: None   Collection Time: 09/25/15 12:00 AM  Result Value Ref Range   Ethanol Negative Cutoff=0.020 %   Amphetamines, Urine Negative Cutoff=1000 ng/mL    Comment: Amphetamine test includes Amphetamine and Methamphetamine.   Barbiturate Negative Cutoff=200 ng/mL   BENZODIAZ UR QL Negative Cutoff=200 ng/mL   Cannabinoid Quant, Ur Negative Cutoff=50 ng/mL   Cocaine (Metabolite) Negative Cutoff=300 ng/mL   OPIATE SCREEN URINE Negative Cutoff=300 ng/mL    Comment: Opiate test includes Codeine, Morphine, Hydromorphone, Hydrocodone.   OXYCODONE+OXYMORPHONE UR QL SCN See Final Results Cutoff=300 ng/mL    Comment: Test includes Oxycodone and Oxymorphone   Phencyclidine Negative Cutoff=25 ng/mL   Methadone Screen, Urine Negative Cutoff=300 ng/mL   Propoxyphene Negative Cutoff=300 ng/mL   Meperidine Negative Cutoff=200 ng/mL    Comment: This test was developed and its performance characteristics determined by [facility]. It has not been cleared or approved by the Food and Drug Administration.      Tramadol Negative Cutoff=200 ng/mL   Creatinine 86.3 20.0 - 300.0 mg/dL   PH OF URINE 6.2 4.5 - 8.9  Oxycodone/Oxymorphone, Confirm     Status: None   Collection Time: 09/25/15 12:00 AM  Result Value Ref Range   OXYCODONE/OXYMORPH Negative Cutoff=300    Comment: Test includes Oxycodone and Oxymorphone     Assessment & Plan  1. Chronic right-sided low back pain without sciatica Patient will be referred to pain clinic for inconsistent urine drug screen, 2 week supply of Percocet provided for her to be able to  establish care with pain management. - oxyCODONE-acetaminophen (PERCOCET) 10-325 MG tablet; Take 1 tablet by mouth 2 (two) times daily as needed for pain.  Dispense: 30 tablet; Refill: 0 - Ambulatory referral to Pain Clinic  2. Panic disorder with agoraphobia and moderate panic attacks Patient will be referred to psychiatry because of inconsistent urine drug screen. She will be given a two-week supply of alprazolam to be able to establish care with a psychiatrist - ALPRAZolam (XANAX) 1 MG tablet; Take 1 tablet (1 mg total) by mouth 2 (two) times daily as needed for anxiety.  Dispense: 30 tablet; Refill: 0 - Ambulatory referral to Psychiatry   Athina Fahey [facility] 10/25/2015 8:51 AM

## 2015-11-30 ENCOUNTER — Ambulatory Visit (INDEPENDENT_AMBULATORY_CARE_PROVIDER_SITE_OTHER): Payer: PRIVATE HEALTH INSURANCE | Admitting: Family Medicine

## 2015-11-30 ENCOUNTER — Encounter: Payer: Self-pay | Admitting: Family Medicine

## 2015-11-30 VITALS — BP 108/67 | HR 69 | Temp 98.4°F | Resp 15 | Ht 70.0 in | Wt 181.5 lb

## 2015-11-30 DIAGNOSIS — E559 Vitamin D deficiency, unspecified: Secondary | ICD-10-CM

## 2015-11-30 DIAGNOSIS — Z8639 Personal history of other endocrine, nutritional and metabolic disease: Secondary | ICD-10-CM

## 2015-11-30 DIAGNOSIS — F4001 Agoraphobia with panic disorder: Secondary | ICD-10-CM

## 2015-11-30 DIAGNOSIS — M545 Low back pain: Secondary | ICD-10-CM | POA: Diagnosis not present

## 2015-11-30 DIAGNOSIS — E538 Deficiency of other specified B group vitamins: Secondary | ICD-10-CM | POA: Diagnosis not present

## 2015-11-30 DIAGNOSIS — G8929 Other chronic pain: Secondary | ICD-10-CM

## 2015-11-30 LAB — POCT GLYCOSYLATED HEMOGLOBIN (HGB A1C): HEMOGLOBIN A1C: 4.6

## 2015-11-30 MED ORDER — OXYCODONE-ACETAMINOPHEN 10-325 MG PO TABS: 1.0000 | ORAL_TABLET | Freq: Two times a day (BID) | ORAL | Status: DC | PRN

## 2015-11-30 MED ORDER — ALPRAZOLAM 1 MG PO TABS
1.0000 mg | ORAL_TABLET | Freq: Two times a day (BID) | ORAL | Status: DC | PRN
Start: 2015-11-30 — End: 2015-12-27

## 2015-11-30 MED ORDER — ALPRAZOLAM 1 MG PO TABS: 1.0000 mg | ORAL_TABLET | Freq: Two times a day (BID) | ORAL | Status: DC | PRN

## 2015-11-30 NOTE — Progress Notes (Signed)
Name: Kiara Le   MRN: [id number]    DOB: 01/27/1976   Date:11/30/2015       Progress Note  Subjective  Chief Complaint  Chief Complaint  Patient presents with  . Follow-up    1 mo     Anxiety Presents for follow-up visit. The problem has been gradually worsening. Symptoms include chest pain, insomnia, irritability, nervous/anxious behavior and panic. Patient reports no suicidal ideas.   Past treatments include benzodiazephines. The treatment provided moderate relief. Compliance with prior treatments has been good.  Back Pain This is a chronic problem. The problem is unchanged. The pain is present in the lumbar spine. The pain does not radiate. The pain is at a severity of 5/10. The pain is mild. Associated symptoms include chest pain. Pertinent negatives include no bladder incontinence, bowel incontinence, fever or leg pain (right knee pain). She has tried analgesics for the symptoms.    Vitamin D deficiency: Pt. Presents for recheck of Vitamin D levels after completion of Vitamin D 50,000 units every week.   Check A1c: Pt. Presents to have her A1c checked, she has history of Diabetes, her A1c became normal after gastric bypass. She likes to have it checked periodically.  Vitamin B12 levels: Patient is here to have her B 12 levels checked, she had gastric bypass surgery and needs levels drawn periodically.    Past Medical History  Diagnosis Date  . Hypertension   . Hyperlipidemia   . Diabetes mellitus without complication ([place])   . Asthma   . Insomnia   . History of Helicobacter pylori infection   . Vitamin B12 deficiency   . Vitamin D deficiency   . Morbid obesity ([place])   . Panic disorder   . Anxiety   . Fatty liver disease, nonalcoholic   . Chronic low back pain   . Chronic fatigue   . PTSD (post-traumatic stress disorder)   . Bipolar disorder [facility])     Past Surgical History  Procedure Laterality Date  . Tubal ligation    . Partial hysterectomy    .  Gastroplasty duodenal switch    . Salpingectomy    . Abdominal hysterectomy      Family History  Problem Relation Age of Onset  . Heart disease Father   . Hypertension Father   . Hyperlipidemia Father   . Drug abuse Mother   . Anxiety disorder Sister   . Depression Sister     Social History   Social History  . Marital Status: Married    Spouse Name: Kiara Le  . Number of Children: 2  . Years of Education: N/A   Occupational History  . Customer Service    Social History Main Topics  . Smoking status: Never Smoker   . Smokeless tobacco: Never Used  . Alcohol Use: No  . Drug Use: No  . Sexual Activity:    Partners: Male    Birth Control/ Protection: None   Other Topics Concern  . Not on file   Social History Narrative     Current outpatient prescriptions:  .  ALPRAZolam (XANAX) 1 MG tablet, Take 1 tablet (1 mg total) by mouth 2 (two) times daily as needed for anxiety., Disp: 30 tablet, Rfl: 0 .  diclofenac sodium (VOLTAREN) 1 % GEL, Apply 4 g topically daily., Disp: 100 g, Rfl: 0 .  glucose blood (ACCU-CHEK AVIVA PLUS) test strip, 1 strip by Does not apply route daily., Disp: , Rfl:  .  Multiple Vitamin (MULTI-VITAMINS) TABS,  Take by mouth., Disp: , Rfl:  .  oxyCODONE-acetaminophen (PERCOCET) 10-325 MG tablet, Take 1 tablet by mouth 2 (two) times daily as needed for pain., Disp: 30 tablet, Rfl: 0 .  pantoprazole (PROTONIX) 40 MG tablet, Take 1 tablet (40 mg total) by mouth 2 (two) times daily., Disp: 180 tablet, Rfl: 0  Allergies  Allergen Reactions  . 2,4-D Dimethylamine (Amisol) Swelling  . Nsaids Other (See Comments)    Cannot take due to abd surgery  . Tetanus Toxoids     Swelling, hot     Review of Systems  Constitutional: Positive for irritability. Negative for fever.  Cardiovascular: Positive for chest pain.  Gastrointestinal: Negative for bowel incontinence.  Genitourinary: Negative for bladder incontinence.  Musculoskeletal: Positive for back pain.   Psychiatric/Behavioral: Negative for suicidal ideas. The patient is nervous/anxious and has insomnia.     Objective  Filed Vitals:   11/30/15 0825  BP: 108/67  Pulse: 69  Temp: 98.4 F (36.9 C)  TempSrc: Oral  Resp: 15  Height: 5\' 10"  (1.778 m)  Weight: 181 lb 8 oz (82.328 kg)  SpO2: 96%    Physical Exam  Constitutional: She is oriented to person, place, and time and well-developed, well-nourished, and in no distress.  HENT:  Head: Normocephalic and atraumatic.  Cardiovascular: Normal rate, regular rhythm and normal heart sounds.   No murmur heard. Pulmonary/Chest: Effort normal and breath sounds normal. She has no wheezes. She has no rales.  Musculoskeletal:       Lumbar back: She exhibits tenderness, pain and spasm.       Back:  Neurological: She is alert and oriented to person, place, and time.  Psychiatric: Memory and judgment normal. Her mood appears anxious. She exhibits a depressed mood. She has a flat affect.  Nursing note and vitals reviewed.      Assessment & Plan  1. Vitamin B12 deficiency  - B12  2. Vitamin D insufficiency  - Vitamin D (25 hydroxy)  3. History of diabetes mellitus, type II  - POCT HgB A1C  4. Chronic right-sided low back pain without sciatica Because of recent erroneous reporting of urine drug screen results from [facility], we will repeat urine drug screen with different agency [facility]). Refill for Percocet provided - oxyCODONE-acetaminophen (PERCOCET) 10-325 MG tablet; Take 1 tablet by mouth 2 (two) times daily as needed for pain.  Dispense: 60 tablet; Refill: 0  5. Panic disorder with agoraphobia and moderate panic attacks Repeat urine drug screen with different agency because of previous erroneous results from [place] provided.She does not wish to follow up with psychiatry because of her previous experience of having been started on multiple medications.  - ALPRAZolam (XANAX) 1 MG tablet; Take 1 tablet (1 mg total)  by mouth 2 (two) times daily as needed for anxiety.  Dispense: 60 tablet; Refill: 0   Kiara Le [facility] 11/30/2015 8:36 AM

## 2015-12-01 LAB — VITAMIN B12: VITAMIN B 12: 563 pg/mL (ref 211–946)

## 2015-12-01 LAB — VITAMIN D 25 HYDROXY (VIT D DEFICIENCY, FRACTURES): Vit D, 25-Hydroxy: 30.3 ng/mL (ref 30.0–100.0)

## 2015-12-18 ENCOUNTER — Encounter (INDEPENDENT_AMBULATORY_CARE_PROVIDER_SITE_OTHER): Payer: Self-pay

## 2015-12-18 ENCOUNTER — Ambulatory Visit: Payer: PRIVATE HEALTH INSURANCE | Attending: Anesthesiology | Admitting: Anesthesiology

## 2015-12-18 ENCOUNTER — Encounter: Payer: Self-pay | Admitting: Anesthesiology

## 2015-12-18 VITALS — BP 117/64 | HR 61 | Temp 98.2°F | Resp 18 | Ht 70.0 in | Wt 175.0 lb

## 2015-12-18 DIAGNOSIS — K760 Fatty (change of) liver, not elsewhere classified: Secondary | ICD-10-CM | POA: Diagnosis not present

## 2015-12-18 DIAGNOSIS — E538 Deficiency of other specified B group vitamins: Secondary | ICD-10-CM | POA: Insufficient documentation

## 2015-12-18 DIAGNOSIS — M25551 Pain in right hip: Secondary | ICD-10-CM | POA: Diagnosis not present

## 2015-12-18 DIAGNOSIS — Z8619 Personal history of other infectious and parasitic diseases: Secondary | ICD-10-CM | POA: Diagnosis not present

## 2015-12-18 DIAGNOSIS — J45909 Unspecified asthma, uncomplicated: Secondary | ICD-10-CM | POA: Diagnosis not present

## 2015-12-18 DIAGNOSIS — I10 Essential (primary) hypertension: Secondary | ICD-10-CM | POA: Diagnosis not present

## 2015-12-18 DIAGNOSIS — E785 Hyperlipidemia, unspecified: Secondary | ICD-10-CM | POA: Diagnosis not present

## 2015-12-18 DIAGNOSIS — E119 Type 2 diabetes mellitus without complications: Secondary | ICD-10-CM | POA: Diagnosis not present

## 2015-12-18 DIAGNOSIS — E559 Vitamin D deficiency, unspecified: Secondary | ICD-10-CM | POA: Insufficient documentation

## 2015-12-18 DIAGNOSIS — M5441 Lumbago with sciatica, right side: Secondary | ICD-10-CM | POA: Insufficient documentation

## 2015-12-18 DIAGNOSIS — M5136 Other intervertebral disc degeneration, lumbar region: Secondary | ICD-10-CM

## 2015-12-18 DIAGNOSIS — K219 Gastro-esophageal reflux disease without esophagitis: Secondary | ICD-10-CM | POA: Insufficient documentation

## 2015-12-18 DIAGNOSIS — M47817 Spondylosis without myelopathy or radiculopathy, lumbosacral region: Secondary | ICD-10-CM | POA: Diagnosis not present

## 2015-12-18 DIAGNOSIS — G4700 Insomnia, unspecified: Secondary | ICD-10-CM | POA: Diagnosis not present

## 2015-12-18 DIAGNOSIS — M25569 Pain in unspecified knee: Secondary | ICD-10-CM | POA: Diagnosis present

## 2015-12-18 DIAGNOSIS — F418 Other specified anxiety disorders: Secondary | ICD-10-CM | POA: Insufficient documentation

## 2015-12-18 DIAGNOSIS — F319 Bipolar disorder, unspecified: Secondary | ICD-10-CM | POA: Insufficient documentation

## 2015-12-18 DIAGNOSIS — G8929 Other chronic pain: Secondary | ICD-10-CM | POA: Diagnosis not present

## 2015-12-18 DIAGNOSIS — F410 Panic disorder [episodic paroxysmal anxiety] without agoraphobia: Secondary | ICD-10-CM | POA: Diagnosis not present

## 2015-12-18 DIAGNOSIS — M549 Dorsalgia, unspecified: Secondary | ICD-10-CM | POA: Diagnosis present

## 2015-12-18 DIAGNOSIS — F4310 Post-traumatic stress disorder, unspecified: Secondary | ICD-10-CM | POA: Diagnosis not present

## 2015-12-18 DIAGNOSIS — M5431 Sciatica, right side: Secondary | ICD-10-CM | POA: Diagnosis not present

## 2015-12-18 DIAGNOSIS — M51369 Other intervertebral disc degeneration, lumbar region without mention of lumbar back pain or lower extremity pain: Secondary | ICD-10-CM

## 2015-12-18 NOTE — Patient Instructions (Signed)
Facet Joint Block The facet joints connect the bones of the spine (vertebrae). They make it possible for you to bend, twist, and make other movements with your spine. They also prevent you from overbending, overtwisting, and making other excessive movements.  A facet joint block is a procedure where a numbing medicine (anesthetic) is injected into a facet joint. Often, a type of anti-inflammatory medicine called a steroid is also injected. A facet joint block may be done for two reasons:   Diagnosis. A facet joint block may be done as a test to see whether neck or back pain is caused by a worn-down or infected facet joint. If the pain gets better after a facet joint block, it means the pain is probably coming from the facet joint. If the pain does not get better, it means the pain is probably not coming from the facet joint.   Therapy. A facet joint block may be done to relieve neck or back pain caused by a facet joint. A facet joint block is only done as a therapy if the pain does not improve with medicine, exercise programs, physical therapy, and other forms of pain management. LET YOUR HEALTH CARE PROVIDER KNOW ABOUT:   Any allergies you have.   All medicines you are taking, including vitamins, herbs, eyedrops, and over-the-counter medicines and creams.   Previous problems you or members of your family have had with the use of anesthetics.   Any blood disorders you have had.   Other health problems you have. RISKS AND COMPLICATIONS Generally, having a facet joint block is safe. However, as with any procedure, complications can occur. Possible complications associated with having a facet joint block include:   Bleeding.   Injury to a nerve near the injection site.   Pain at the injection site.   Weakness or numbness in areas controlled by nerves near the injection site.   Infection.   Temporary fluid retention.   Allergic reaction to anesthetics or medicines used during  the procedure. BEFORE THE PROCEDURE   Follow your health care provider's instructions if you are taking dietary supplements or medicines. You may need to stop taking them or reduce your dosage.   Do not take any new dietary supplements or medicines without asking your health care provider first.   Follow your health care provider's instructions about eating and drinking before the procedure. You may need to stop eating and drinking several hours before the procedure.   Arrange to have an adult drive you home after the procedure. PROCEDURE  You may need to remove your clothing and dress in an open-back gown so that your health care provider can access your spine.   The procedure will be done while you are lying on an X-ray table. Most of the time you will be asked to lie on your stomach, but you may be asked to lie in a different position if an injection will be made in your neck.   Special machines will be used to monitor your oxygen levels, heart rate, and blood pressure.   If an injection will be made in your neck, an intravenous (IV) tube will be inserted into one of your veins. Fluids and medicine will flow directly into your body through the IV tube.   The area over the facet joint where the injection will be made will be cleaned with an antiseptic soap. The surrounding skin will be covered with sterile drapes.   An anesthetic will be applied to your skin   to make the injection area numb. You may feel a temporary stinging or burning sensation.   A video X-ray machine will be used to locate the joint. A contrast dye may be injected into the facet joint area to help with locating the joint.   When the joint is located, an anesthetic medicine will be injected into the joint through the needle.   Your health care provider will ask you whether you feel pain relief. If you do feel relief, a steroid may be injected to provide pain relief for a longer period of time. If you do not  feel relief or feel only partial relief, additional injections of an anesthetic may be made in other facet joints.   The needle will be removed, the skin will be cleansed, and bandages will be applied.  AFTER THE PROCEDURE   You will be observed for 15-30 minutes before being allowed to go home. Do not drive. Have an adult drive you or take a taxi or public transportation instead.   If you feel pain relief, the pain will return in several hours or days when the anesthetic wears off.   You may feel pain relief 2-14 days after the procedure. The amount of time this relief lasts varies from person to person.   It is normal to feel some tenderness over the injected area(s) for 2 days following the procedure.   If you have diabetes, you may have a temporary increase in blood sugar.   This information is not intended to replace advice given to you by your health care provider. Make sure you discuss any questions you have with your health care provider.   Document Released: 10/09/2006 Document Revised: 06/10/2014 Document Reviewed: 03/09/2012 Elsevier Interactive Patient Education 2016 [place]  What are the risk, side effects and possible complications? Generally speaking, most procedures are safe.  However, with any procedure there are risks, side effects, and the possibility of complications.  The risks and complications are dependent upon the sites that are lesioned, or the type of nerve block to be performed.  The closer the procedure is to the spine, the more serious the risks are.  Great care is taken when placing the radio frequency needles, block needles or lesioning probes, but sometimes complications can occur.  Infection: Any time there is an injection through the skin, there is a risk of infection.  This is why sterile conditions are used for these blocks.  There are four possible types of infection.  Localized skin infection.  Central  Nervous System Infection-This can be in the form of Meningitis, which can be deadly.  Epidural Infections-This can be in the form of an epidural abscess, which can cause pressure inside of the spine, causing compression of the spinal cord with subsequent paralysis. This would require an emergency surgery to decompress, and there are no guarantees that the patient would recover from the paralysis.  Discitis-This is an infection of the intervertebral discs.  It occurs in about 1% of discography procedures.  It is difficult to treat and it may lead to surgery.        2. Pain: the needles have to go through skin and soft tissues, will cause soreness.       3. Damage to internal structures:  The nerves to be lesioned may be near blood vessels or    other nerves which can be potentially damaged.       4. Bleeding: Bleeding is more common if  the patient is taking blood thinners such as  aspirin, Coumadin, Ticiid, Plavix, etc., or if he/she have some genetic predisposition  such as hemophilia. Bleeding into the spinal canal can cause compression of the spinal  cord with subsequent paralysis.  This would require an emergency surgery to  decompress and there are no guarantees that the patient would recover from the  paralysis.       5. Pneumothorax:  Puncturing of a lung is a possibility, every time a needle is introduced in  the area of the chest or upper back.  Pneumothorax refers to free air around the  collapsed lung(s), inside of the thoracic cavity (chest cavity).  Another two possible  complications related to a similar event would include: Hemothorax and Chylothorax.   These are variations of the Pneumothorax, where instead of air around the collapsed  lung(s), you may have blood or chyle, respectively.       6. Spinal headaches: They may occur with any procedures in the area of the spine.       7. Persistent CSF (Cerebro-Spinal Fluid) leakage: This is a rare problem, but may occur  with prolonged  intrathecal or epidural catheters either due to the formation of a fistulous  track or a dural tear.       8. Nerve damage: By working so close to the spinal cord, there is always a possibility of  nerve damage, which could be as serious as a permanent spinal cord injury with  paralysis.       9. Death:  Although rare, severe deadly allergic reactions known as "Anaphylactic  reaction" can occur to any of the medications used.      10. Worsening of the symptoms:  We can always make thing worse.  What are the chances of something like this happening? Chances of any of this occuring are extremely low.  By statistics, you have more of a chance of getting killed in a motor vehicle accident: while driving to the hospital than any of the above occurring .  Nevertheless, you should be aware that they are possibilities.  In general, it is similar to taking a shower.  Everybody knows that you can slip, hit your head and get killed.  Does that mean that you should not shower again?  Nevertheless always keep in mind that statistics do not mean anything if you happen to be on the wrong side of them.  Even if a procedure has a 1 (one) in a 1,000,000 (million) chance of going wrong, it you happen to be that one..Also, keep in mind that by statistics, you have more of a chance of having something go wrong when taking medications.  Who should not have this procedure? If you are on a blood thinning medication (e.g. Coumadin, Plavix, see list of "Blood Thinners"), or if you have an active infection going on, you should not have the procedure.  If you are taking any blood thinners, please inform your physician.  How should I prepare for this procedure?  Do not eat or drink anything at least six hours prior to the procedure.  Bring a driver with you .  It cannot be a taxi.  Come accompanied by an adult that can drive you back, and that is strong enough to help you if your legs get weak or numb from the local  anesthetic.  Take all of your medicines the morning of the procedure with just enough water to swallow them.  If you have  diabetes, make sure that you are scheduled to have your procedure done first thing in the morning, whenever possible.  If you have diabetes, take only half of your insulin dose and notify our nurse that you have done so as soon as you arrive at the clinic.  If you are diabetic, but only take blood sugar pills (oral hypoglycemic), then do not take them on the morning of your procedure.  You may take them after you have had the procedure.  Do not take aspirin or any aspirin-containing medications, at least eleven (11) days prior to the procedure.  They may prolong bleeding.  Wear loose fitting clothing that may be easy to take off and that you would not mind if it got stained with Betadine or blood.  Do not wear any jewelry or perfume  Remove any nail coloring.  It will interfere with some of our monitoring equipment.  NOTE: Remember that this is not meant to be interpreted as a complete list of all possible complications.  Unforeseen problems may occur.  BLOOD THINNERS The following drugs contain aspirin or other products, which can cause increased bleeding during surgery and should not be taken for 2 weeks prior to and 1 week after surgery.  If you should need take something for relief of minor pain, you may take acetaminophen which is found in Tylenol,m Datril, Anacin-3 and Panadol. It is not blood thinner. The products listed below are.  Do not take any of the products listed below in addition to any listed on your instruction sheet.  A.P.C or A.P.C with Codeine Codeine Phosphate Capsules #3 Ibuprofen Ridaura  ABC compound Congesprin Imuran rimadil  Advil Cope Indocin Robaxisal  Alka-Seltzer Effervescent Pain Reliever and Antacid Coricidin or Coricidin-D  Indomethacin Rufen  Alka-Seltzer plus Cold Medicine Cosprin Ketoprofen S-A-C Tablets  Anacin Analgesic Tablets  or Capsules Coumadin Korlgesic Salflex  Anacin Extra Strength Analgesic tablets or capsules CP-2 Tablets Lanoril Salicylate  Anaprox Cuprimine Capsules Levenox Salocol  Anexsia-D Dalteparin Magan Salsalate  Anodynos Darvon compound Magnesium Salicylate Sine-off  Ansaid Dasin Capsules Magsal Sodium Salicylate  Anturane Depen Capsules Marnal Soma  APF Arthritis pain formula Dewitt's Pills Measurin Stanback  Argesic Dia-Gesic Meclofenamic Sulfinpyrazone  Arthritis Bayer Timed Release Aspirin Diclofenac Meclomen Sulindac  Arthritis pain formula Anacin Dicumarol Medipren Supac  Analgesic (Safety coated) Arthralgen Diffunasal Mefanamic Suprofen  Arthritis Strength Bufferin Dihydrocodeine Mepro Compound Suprol  Arthropan liquid Dopirydamole Methcarbomol with Aspirin Synalgos  ASA tablets/Enseals Disalcid Micrainin Tagament  Ascriptin Doan's Midol Talwin  Ascriptin A/D Dolene Mobidin Tanderil  Ascriptin Extra Strength Dolobid Moblgesic Ticlid  Ascriptin with Codeine Doloprin or Doloprin with Codeine Momentum Tolectin  Asperbuf Duoprin Mono-gesic Trendar  Aspergum Duradyne Motrin or Motrin IB Triminicin  Aspirin plain, buffered or enteric coated Durasal Myochrisine Trigesic  Aspirin Suppositories Easprin Nalfon Trillsate  Aspirin with Codeine Ecotrin Regular or Extra Strength Naprosyn Uracel  Atromid-S Efficin Naproxen Ursinus  Auranofin Capsules Elmiron Neocylate Vanquish  Axotal Emagrin Norgesic Verin  Azathioprine Empirin or Empirin with Codeine Normiflo Vitamin E  Azolid Emprazil Nuprin Voltaren  Bayer Aspirin plain, buffered or children's or timed BC Tablets or powders Encaprin Orgaran Warfarin Sodium  Buff-a-Comp Enoxaparin Orudis Zorpin  Buff-a-Comp with Codeine Equegesic Os-Cal-Gesic   Buffaprin Excedrin plain, buffered or Extra Strength Oxalid   Bufferin Arthritis Strength Feldene Oxphenbutazone   Bufferin plain or Extra Strength Feldene Capsules Oxycodone with Aspirin   Bufferin  with Codeine Fenoprofen Fenoprofen Pabalate or Pabalate-SF   Buffets II Flogesic Panagesic     Buffinol plain or Extra Strength Florinal or Florinal with Codeine Panwarfarin   Buf-Tabs Flurbiprofen Penicillamine   Butalbital Compound Four-way cold tablets Penicillin   Butazolidin Fragmin Pepto-Bismol   Carbenicillin Geminisyn Percodan   Carna Arthritis Reliever Geopen Persantine   Carprofen Gold's salt Persistin   Chloramphenicol Goody's Phenylbutazone   Chloromycetin Haltrain Piroxlcam   Clmetidine heparin Plaquenil   Cllnoril Hyco-pap Ponstel   Clofibrate Hydroxy chloroquine Propoxyphen         Before stopping any of these medications, be sure to consult the physician who ordered them.  Some, such as Coumadin (Warfarin) are ordered to prevent or treat serious conditions such as "deep thrombosis", "pumonary embolisms", and other heart problems.  The amount of time that you may need off of the medication may also vary with the medication and the reason for which you were taking it.  If you are taking any of these medications, please make sure you notify your pain physician before you undergo any procedures.         Epidural Steroid Injection An epidural steroid injection is given to relieve pain in your neck, back, or legs that is caused by the irritation or swelling of a nerve root. This procedure involves injecting a steroid and numbing medicine (anesthetic) into the epidural space. The epidural space is the space between the outer covering of your spinal cord and the bones that form your backbone (vertebra).  LET [facility] CARE PROVIDER KNOW ABOUT:   Any allergies you have.  All medicines you are taking, including vitamins, herbs, eye drops, creams, and over-the-counter medicines such as aspirin.  Previous problems you or members of your family have had with the use of anesthetics.  Any blood disorders or blood clotting disorders you have.  Previous surgeries you have  had.  Medical conditions you have. RISKS AND COMPLICATIONS Generally, this is a safe procedure. However, as with any procedure, complications can occur. Possible complications of epidural steroid injection include:  Headache.  Bleeding.  Infection.  Allergic reaction to the medicines.  Damage to your nerves. The response to this procedure depends on the underlying cause of the pain and its duration. People who have long-term (chronic) pain are less likely to benefit from epidural steroids than are those people whose pain comes on strong and suddenly. BEFORE THE PROCEDURE   Ask your health care provider about changing or stopping your regular medicines. You may be advised to stop taking blood-thinning medicines a few days before the procedure.  You may be given medicines to reduce anxiety.  Arrange for someone to take you home after the procedure. PROCEDURE   You will remain awake during the procedure. You may receive medicine to make you relaxed.  You will be asked to lie on your stomach.  The injection site will be cleaned.  The injection site will be numbed with a medicine (local anesthetic).  A needle will be injected through your skin into the epidural space.  Your health care provider will use an X-ray machine to ensure that the steroid is delivered closest to the affected nerve. You may have minimal discomfort at this time.  Once the needle is in the right position, the local anesthetic and the steroid will be injected into the epidural space.  The needle will then be removed and a bandage will be applied to the injection site. AFTER THE PROCEDURE   You may be monitored for a short time before you go home.  You may feel weakness or  numbness in your arm or leg, which disappears within hours.  You may be allowed to eat, drink, and take your regular medicine.  You may have soreness at the site of the injection.   This information is not intended to replace advice  given to you by your health care provider. Make sure you discuss any questions you have with your health care provider.   Document Released: 08/27/2007 Document Revised: 01/20/2013 Document Reviewed: 11/06/2012 Elsevier Interactive Patient Education [employer].

## 2015-12-19 NOTE — Progress Notes (Signed)
Subjective:  Patient ID: Kiara Le, female    DOB: [birth year]  Age: 40 y.o. MRN: [id number]  CC: Back Pain and Knee Pain   Service Provided on Last Visit: Evaluation (new patient today)  PROCEDURE:None  HPI Kiara Le presents for a new patient evaluation. She is a pleasant 40 year old white female with a long-standing history of low back pain. She reports a pain that isn't primarily centralized in the low back that is worse with extension sitting standing squatting and walking for prolonged periods of time. Bending and climbing also seemed to aggravate her pain. Colpacs height packs and medication management in addition to warm showers have helped alleviate pain. He primarily stay centralized in the low back with radiation into the right lateral hip and posterior leg with no associated problems with bowel or bladder dysfunction. She has no tingling affecting the lower legs but does have bilateral knee pain. The pain occasionally radiates into the posterior right thigh it is sharp in nature and she is status post an MRI showing evidence of some facet genic disease And mild degenerative disc disease at L5-S1. She furthermore states that her strength is been well preserved other than the chronic pain in the right lower back  History Shukri has a past medical history of Hypertension; Hyperlipidemia; Diabetes mellitus without complication ([place]); Asthma; Insomnia; History of Helicobacter pylori infection; Vitamin B12 deficiency; Vitamin D deficiency; Morbid obesity ([place]); Panic disorder; Anxiety; Fatty liver disease, nonalcoholic; Chronic low back pain; Chronic fatigue; PTSD (post-traumatic stress disorder); and Bipolar disorder ([place]).   She has past surgical history that includes Tubal ligation; Partial hysterectomy; Gastroplasty duodenal switch; Salpingectomy; and Abdominal hysterectomy.   Her family history includes Anxiety disorder in her sister; Depression in her sister; Drug abuse in  her mother; Heart disease in her father; Hyperlipidemia in her father; Hypertension in her father.She reports that she has never smoked. She has never used smokeless tobacco. She reports that she does not drink alcohol or use illicit drugs.            No results found for: TOXASSSELUR  Outpatient Prescriptions Prior to Visit  Medication Sig Dispense Refill  . ALPRAZolam (XANAX) 1 MG tablet Take 1 tablet (1 mg total) by mouth 2 (two) times daily as needed for anxiety. 60 tablet 0  . diclofenac sodium (VOLTAREN) 1 % GEL Apply 4 g topically daily. 100 g 0  . glucose blood (ACCU-CHEK AVIVA PLUS) test strip 1 strip by Does not apply route daily.    Marland Kitchen oxyCODONE-acetaminophen (PERCOCET) 10-325 MG tablet Take 1 tablet by mouth 2 (two) times daily as needed for pain. 60 tablet 0  . pantoprazole (PROTONIX) 40 MG tablet Take 1 tablet (40 mg total) by mouth 2 (two) times daily. 180 tablet 0  . Multiple Vitamin (MULTI-VITAMINS) TABS Take by mouth. Reported on 12/18/2015     No facility-administered medications prior to visit.   Lab Results  Component Value Date   WBC 5.2 09/22/2014   HGB 13.5 09/22/2014   HCT 40 09/22/2014   PLT 210 09/22/2014   GLUCOSE 86 12/27/2014   CHOL 118 09/22/2014   TRIG 64 09/22/2014   HDL 49 09/22/2014   LDLCALC 56 09/22/2014   ALT 26 12/27/2014   AST 17 12/27/2014   NA 141 12/27/2014   K 4.6 12/27/2014   CL 101 12/27/2014   CREATININE 0.68 12/27/2014   BUN 9 12/27/2014   CO2 24 12/27/2014   TSH 1.76 09/22/2014   HGBA1C  4.6 11/30/2015    --------------------------------------------------------------------------------------------------------------------- Mm Digital Screening Bilateral  02/14/2015  CLINICAL DATA:  Screening. EXAM: DIGITAL SCREENING BILATERAL MAMMOGRAM WITH CAD COMPARISON:  None. ACR Breast Density Category c: The breast tissue is heterogeneously dense, which may obscure small masses FINDINGS: There are no findings suspicious for malignancy.  Images were processed with CAD. IMPRESSION: No mammographic evidence of malignancy. A result letter of this screening mammogram will be mailed directly to the patient. RECOMMENDATION: Screening mammogram at age 12. (Code:SM-B-40A) BI-RADS CATEGORY  1: Negative. Electronically Signed   By: Everlean Alstrom M.D.   On: 02/14/2015 09:02       ---------------------------------------------------------------------------------------------------------------------- Past Medical History  Diagnosis Date  . Hypertension   . Hyperlipidemia   . Diabetes mellitus without complication ([place])   . Asthma   . Insomnia   . History of Helicobacter pylori infection   . Vitamin B12 deficiency   . Vitamin D deficiency   . Morbid obesity ([place])   . Panic disorder   . Anxiety   . Fatty liver disease, nonalcoholic   . Chronic low back pain   . Chronic fatigue   . PTSD (post-traumatic stress disorder)   . Bipolar disorder [facility])     Past Surgical History  Procedure Laterality Date  . Tubal ligation    . Partial hysterectomy    . Gastroplasty duodenal switch    . Salpingectomy    . Abdominal hysterectomy      Family History  Problem Relation Age of Onset  . Heart disease Father   . Hypertension Father   . Hyperlipidemia Father   . Drug abuse Mother   . Anxiety disorder Sister   . Depression Sister     Social History  Substance Use Topics  . Smoking status: Never Smoker   . Smokeless tobacco: Never Used  . Alcohol Use: No    ---------------------------------------------------------------------------------------------------------------------- Social History   Social History  . Marital Status: Married    Spouse Name: Michiel Cowboy  . Number of Children: 2  . Years of Education: N/A   Occupational History  . Customer Service    Social History Main Topics  . Smoking status: Never Smoker   . Smokeless tobacco: Never Used  . Alcohol Use: No  . Drug Use: No  . Sexual Activity:    Partners:  Male    Birth Control/ Protection: None   Other Topics Concern  . None   Social History Narrative    Scheduled Meds: Continuous Infusions: PRN Meds:.   BP 117/64 mmHg  Pulse 61  Temp(Src) 98.2 F (36.8 C) (Oral)  Resp 18  Ht 5\' 10"  (1.778 m)  Wt 175 lb (79.379 kg)  BMI 25.11 kg/m2  SpO2 99%   BP Readings from Last 3 Encounters:  12/18/15 117/64  11/30/15 108/67  10/25/15 112/70     Wt Readings from Last 3 Encounters:  12/18/15 175 lb (79.379 kg)  11/30/15 181 lb 8 oz (82.328 kg)  10/25/15 180 lb 14.4 oz (82.056 kg)     ----------------------------------------------------------------------------------------------------------------------  ROS Review of Systems  Cardiac: High blood pressure Pulmonary: Negative Exline neurologic: Negative Psychological: Anxiety depression panic attacks GI: Reflux Endocrine: Diabetes  Objective:  BP 117/64 mmHg  Pulse 61  Temp(Src) 98.2 F (36.8 C) (Oral)  Resp 18  Ht 5\' 10"  (1.778 m)  Wt 175 lb (79.379 kg)  BMI 25.11 kg/m2  SpO2 99%  Physical Exam Patient is a pleasant 40 year old white female in no acute distress and she is alert and  oriented 3 cooperative and compliant Pupils are equally round reactive to light extraocular muscles are intact Heart is regular rate and rhythm without murmur Lungs are clear to also auscultation Inspection low back reveals some paraspinous muscle tenderness centralized in the lower back that is exacerbated by extension at the low back with right lateral rotation. This causes pain radiating into the right hip and buttocks with the patient in the supine position she has a positive straight leg raise causing pain in the right lower back at 45 but no pain radiating beyond the knee her sensation appears to be grossly intact muscle tone and bulk is good and strength is measured at 5 over 5 throughout the lower extremities.     Assessment & Plan:   Honestee was seen today for back pain and  knee pain.  Diagnoses and all orders for this visit:  DDD (degenerative disc disease), lumbar -     Lumbar Epidural Injection; Future -     Ambulatory referral to Physical Therapy -     ToxASSURE Select 13 (MW), Urine  Sciatica associated with disorder of lumbar spine, right -     Lumbar Epidural Injection; Future -     Ambulatory referral to Physical Therapy -     ToxASSURE Select 13 (MW), Urine  Hip pain, chronic, right -     Lumbar Epidural Injection; Future -     Ambulatory referral to Physical Therapy -     ToxASSURE Select 59 (MW), Urine  Facet arthritis of lumbosacral region -     Ambulatory referral to Physical Therapy -     ToxASSURE Select 13 (MW), Urine     ----------------------------------------------------------------------------------------------------------------------  Problem List Items Addressed This Visit    None    Visit Diagnoses    DDD (degenerative disc disease), lumbar    -  Primary    Relevant Orders    Lumbar Epidural Injection    Ambulatory referral to Physical Therapy    ToxASSURE Select 13 (MW), Urine    Sciatica associated with disorder of lumbar spine, right        Relevant Orders    Lumbar Epidural Injection    Ambulatory referral to Physical Therapy    ToxASSURE Select 13 (MW), Urine    Hip pain, chronic, right        Relevant Orders    Lumbar Epidural Injection    Ambulatory referral to Physical Therapy    ToxASSURE Select 13 (MW), Urine    Facet arthritis of lumbosacral region        Relevant Orders    Ambulatory referral to Physical Therapy    ToxASSURE Select 13 (MW), Urine       ----------------------------------------------------------------------------------------------------------------------  1. DDD (degenerative disc disease), lumbar  - Lumbar Epidural Injection; Future  - Ambulatory referral to Physical Therapy - ToxASSURE Select 13 (MW), Urine  2. Sciatica associated with disorder of lumbar spine, right  -  Lumbar Epidural Injection; Future For the positive straight leg raise and radicular symptoms she's having affecting the right L5 distribution. - Ambulatory referral to Physical Therapy - ToxASSURE Select 13 (MW), Urine  3. Hip pain, chronic, right  - Lumbar Epidural Injection; Future - Ambulatory referral to Physical Therapy - ToxASSURE Select 13 (MW), Urine  4. Facet arthritis of lumbosacral region I'm hopeful that the epidural steroid injection will help with some of the facet genic pain she is also experiencing and she may be a candidate for a facet injection at a future visit. -  Ambulatory referral to Physical Therapy - ToxASSURE Select 13 (MW), Urine    ----------------------------------------------------------------------------------------------------------------------  I am having Ms. Maseda maintain her glucose blood, pantoprazole, MULTI-VITAMINS, diclofenac sodium, oxyCODONE-acetaminophen, and ALPRAZolam.   No orders of the defined types were placed in this encounter.       Follow-up: Return in about 3 weeks (around 01/08/2016) for evaluation, procedure.    Molli Barrows, MD  This dictation was performed utilizing Dragon voice recognition software.  Please excuse any unintentional or mistaken typographical errors as a result of its unedited utilization.

## 2015-12-27 ENCOUNTER — Encounter: Payer: Self-pay | Admitting: Family Medicine

## 2015-12-27 ENCOUNTER — Ambulatory Visit (INDEPENDENT_AMBULATORY_CARE_PROVIDER_SITE_OTHER): Payer: PRIVATE HEALTH INSURANCE | Admitting: Family Medicine

## 2015-12-27 DIAGNOSIS — M545 Low back pain: Secondary | ICD-10-CM | POA: Diagnosis not present

## 2015-12-27 DIAGNOSIS — F4001 Agoraphobia with panic disorder: Secondary | ICD-10-CM

## 2015-12-27 DIAGNOSIS — G8929 Other chronic pain: Secondary | ICD-10-CM | POA: Diagnosis not present

## 2015-12-27 DIAGNOSIS — M1711 Unilateral primary osteoarthritis, right knee: Secondary | ICD-10-CM

## 2015-12-27 DIAGNOSIS — M129 Arthropathy, unspecified: Secondary | ICD-10-CM

## 2015-12-27 MED ORDER — DICLOFENAC SODIUM 1 % TD GEL: 4.0000 g | Freq: Four times a day (QID) | TRANSDERMAL | 0 refills | Status: DC

## 2015-12-27 MED ORDER — OXYCODONE-ACETAMINOPHEN 10-325 MG PO TABS: 1.0000 | ORAL_TABLET | Freq: Two times a day (BID) | ORAL | 0 refills | Status: DC | PRN

## 2015-12-27 MED ORDER — ALPRAZOLAM 1 MG PO TABS: 1.0000 mg | ORAL_TABLET | Freq: Two times a day (BID) | ORAL | 0 refills | Status: DC | PRN

## 2015-12-27 NOTE — Progress Notes (Signed)
Name: Kiara Le   MRN: [id number]    DOB: 11/30/1975   Date:12/27/2015       Progress Note  Subjective  Chief Complaint  Chief Complaint  Patient presents with  . Pain  . Anxiety    Anxiety  Presents for follow-up visit. Symptoms include depressed mood, excessive worry, nervous/anxious behavior and panic. Symptoms occur most days.    Back Pain  This is a chronic problem. The pain is present in the lumbar spine. The pain radiates to the right thigh (right gluteal area.). The pain is at a severity of 3/10. The pain is moderate. The symptoms are aggravated by position. Pertinent negatives include no bladder incontinence or bowel incontinence. She has tried analgesics [facility] has been referred to pain management and is scheduled to receive injections to her lower back next week.) for the symptoms.     Past Medical History:  Diagnosis Date  . Anxiety   . Asthma   . Bipolar disorder ([place])   . Chronic fatigue   . Chronic low back pain   . Diabetes mellitus without complication ([place])   . Fatty liver disease, nonalcoholic   . History of Helicobacter pylori infection   . Hyperlipidemia   . Hypertension   . Insomnia   . Morbid obesity ([place])   . Panic disorder   . PTSD (post-traumatic stress disorder)   . Vitamin B12 deficiency   . Vitamin D deficiency     Past Surgical History:  Procedure Laterality Date  . ABDOMINAL HYSTERECTOMY    . GASTROPLASTY DUODENAL SWITCH    . PARTIAL HYSTERECTOMY    . SALPINGECTOMY    . TUBAL LIGATION      Family History  Problem Relation Age of Onset  . Heart disease Father   . Hypertension Father   . Hyperlipidemia Father   . Drug abuse Mother   . Anxiety disorder Sister   . Depression Sister     Social History   Social History  . Marital status: Married    Spouse name: Michiel Cowboy  . Number of children: 2  . Years of education: N/A   Occupational History  . Customer Service    Social History Main Topics  . Smoking status: Never  Smoker  . Smokeless tobacco: Never Used  . Alcohol use No  . Drug use: No  . Sexual activity: Yes    Partners: Male    Birth control/ protection: None   Other Topics Concern  . Not on file   Social History Narrative  . No narrative on file     Current Outpatient Prescriptions:  .  ALPRAZolam (XANAX) 1 MG tablet, Take 1 tablet (1 mg total) by mouth 2 (two) times daily as needed for anxiety., Disp: 60 tablet, Rfl: 0 .  diclofenac sodium (VOLTAREN) 1 % GEL, Apply 4 g topically daily., Disp: 100 g, Rfl: 0 .  glucose blood (ACCU-CHEK AVIVA PLUS) test strip, 1 strip by Does not apply route daily., Disp: , Rfl:  .  Multiple Vitamin (MULTI-VITAMINS) TABS, Take by mouth. Reported on 12/18/2015, Disp: , Rfl:  .  oxyCODONE-acetaminophen (PERCOCET) 10-325 MG tablet, Take 1 tablet by mouth 2 (two) times daily as needed for pain., Disp: 60 tablet, Rfl: 0 .  pantoprazole (PROTONIX) 40 MG tablet, Take 1 tablet (40 mg total) by mouth 2 (two) times daily., Disp: 180 tablet, Rfl: 0  Allergies  Allergen Reactions  . 2,4-D Dimethylamine (Amisol) Swelling  . Nsaids Other (See Comments)  Cannot take due to abd surgery  . Tetanus Toxoids     Swelling, hot     Review of Systems  Gastrointestinal: Negative for bowel incontinence.  Genitourinary: Negative for bladder incontinence.  Musculoskeletal: Positive for back pain.  Psychiatric/Behavioral: The patient is nervous/anxious.       Objective  Vitals:   12/27/15 1456  BP: 112/76  Pulse: 78  Resp: 16  Temp: 98.8 F (37.1 C)  TempSrc: Oral  SpO2: 97%  Weight: 183 lb 4.8 oz (83.1 kg)  Height: 5\' 10"  (1.778 m)    Physical Exam  Constitutional: She is well-developed, well-nourished, and in no distress.  Cardiovascular: Normal rate, regular rhythm, S1 normal and S2 normal.   No murmur heard. Pulmonary/Chest: She has no decreased breath sounds. She has no wheezes.  Musculoskeletal:       Lumbar back: She exhibits tenderness, pain and  spasm.       Back:  Psychiatric: Mood, memory, affect and judgment normal.  Nursing note and vitals reviewed.     Assessment & Plan  1. Arthritis of right knee  - diclofenac sodium (VOLTAREN) 1 % GEL; Apply 4 g topically 4 (four) times daily.  Dispense: 100 g; Refill: 0  2. Chronic right-sided low back pain without sciatica Patient is now being followed by pain clinic, scheduled to receive an injection in her lower back next week. - oxyCODONE-acetaminophen (PERCOCET) 10-325 MG tablet; Take 1 tablet by mouth 2 (two) times daily as needed for pain.  Dispense: 60 tablet; Refill: 0  3. Panic disorder with agoraphobia and moderate panic attacks Stable, continue on alprazolam as prescribed. Refills provided - ALPRAZolam (XANAX) 1 MG tablet; Take 1 tablet (1 mg total) by mouth 2 (two) times daily as needed for anxiety.  Dispense: 60 tablet; Refill: 0   [name] [facility] 12/27/2015 3:21 PM

## 2015-12-29 LAB — TOXASSURE SELECT 13 (MW), URINE: PDF: 0

## 2016-01-04 ENCOUNTER — Encounter: Payer: Self-pay | Admitting: Anesthesiology

## 2016-01-04 ENCOUNTER — Ambulatory Visit: Payer: PRIVATE HEALTH INSURANCE | Attending: Anesthesiology | Admitting: Anesthesiology

## 2016-01-04 DIAGNOSIS — M5431 Sciatica, right side: Secondary | ICD-10-CM

## 2016-01-04 DIAGNOSIS — M25551 Pain in right hip: Secondary | ICD-10-CM

## 2016-01-04 DIAGNOSIS — G8929 Other chronic pain: Secondary | ICD-10-CM

## 2016-01-04 DIAGNOSIS — M5136 Other intervertebral disc degeneration, lumbar region: Secondary | ICD-10-CM | POA: Diagnosis not present

## 2016-01-04 MED ORDER — LACTATED RINGERS IV SOLN: 1000.0000 mL | INTRAVENOUS | Status: DC

## 2016-01-04 MED ORDER — ROPIVACAINE HCL 2 MG/ML IJ SOLN
10.0000 mL | Freq: Once | INTRAMUSCULAR | Status: AC
  Administered 2016-01-04: 10 mL via EPIDURAL
  Filled 2016-01-04: qty 10

## 2016-01-04 MED ORDER — IOPAMIDOL (ISOVUE-M 200) INJECTION 41%
INTRAMUSCULAR | Status: AC
  Administered 2016-01-04: 14:00:00
  Filled 2016-01-04: qty 10

## 2016-01-04 MED ORDER — IOPAMIDOL (ISOVUE-M 200) INJECTION 41%: 20.0000 mL | Freq: Once | INTRAMUSCULAR | Status: DC | PRN

## 2016-01-04 MED ORDER — LIDOCAINE HCL (PF) 1 % IJ SOLN
5.0000 mL | Freq: Once | INTRAMUSCULAR | Status: AC
  Administered 2016-01-04: 5 mL via SUBCUTANEOUS
  Filled 2016-01-04: qty 5

## 2016-01-04 MED ORDER — FENTANYL CITRATE (PF) 100 MCG/2ML IJ SOLN
100.0000 ug | Freq: Once | INTRAMUSCULAR | Status: DC
  Filled 2016-01-04: qty 2

## 2016-01-04 MED ORDER — TRIAMCINOLONE ACETONIDE 40 MG/ML IJ SUSP
40.0000 mg | Freq: Once | INTRAMUSCULAR | Status: AC
  Administered 2016-01-04: 40 mg
  Filled 2016-01-04: qty 1

## 2016-01-04 MED ORDER — MIDAZOLAM HCL 5 MG/5ML IJ SOLN
INTRAMUSCULAR | Status: AC
  Administered 2016-01-04: 2 mg via INTRAVENOUS
  Filled 2016-01-04: qty 5

## 2016-01-04 MED ORDER — MIDAZOLAM HCL 2 MG/2ML IJ SOLN: 5.0000 mg | Freq: Once | INTRAMUSCULAR | Status: DC

## 2016-01-04 NOTE — Progress Notes (Signed)
Subjective:  Patient ID: Kiara Le, female    DOB: 1975/09/15  Age: 40 y.o. MRN: [id number]  CC: Back Pain and Hip Pain (right)   Service Provided on Last Visit: Evaluation (new patient evaluation)  PROCEDURE:None  HPI Kiara Le presents for a new patient evaluation. She is a pleasant 40 year old white female with a long-standing history of low back pain. She reports a pain that isn't primarily centralized in the low back that is worse with extension sitting standing squatting and walking for prolonged periods of time. Bending and climbing also seemed to aggravate her pain. Colpacs height packs and medication management in addition to warm showers have helped alleviate pain. He primarily stay centralized in the low back with radiation into the right lateral hip and posterior leg with no associated problems with bowel or bladder dysfunction. She has no tingling affecting the lower legs but does have bilateral knee pain. The pain occasionally radiates into the posterior right thigh it is sharp in nature and she is status post an MRI showing evidence of some facet genic disease And mild degenerative disc disease at L5-S1. She furthermore states that her strength is been well preserved other than the chronic pain in the right lower back  History Lloyd has a past medical history of Anxiety; Asthma; Bipolar disorder ([place]); Chronic fatigue; Chronic low back pain; Diabetes mellitus without complication ([place]); Fatty liver disease, nonalcoholic; History of Helicobacter pylori infection; Hyperlipidemia; Hypertension; Insomnia; Morbid obesity ([place]); Panic disorder; PTSD (post-traumatic stress disorder); Vitamin B12 deficiency; and Vitamin D deficiency.   She has a past surgical history that includes Tubal ligation; Partial hysterectomy; Gastroplasty duodenal switch; Salpingectomy; and Abdominal hysterectomy.   Her family history includes Anxiety disorder in her sister; Depression in her sister;  Drug abuse in her mother; Heart disease in her father; Hyperlipidemia in her father; Hypertension in her father.She reports that she has never smoked. She has never used smokeless tobacco. She reports that she does not drink alcohol or use drugs.            [facility] Select 13  Date Value Ref Range Status  12/18/2015 FINAL  Final    Comment:    ==================================================================== [facility] SELECT 13 ([facility]) ==================================================================== Test                             Result       Flag       Units Drug Present and Declared for Prescription Verification   Alprazolam                     94           EXPECTED   ng/mg creat   Alpha-hydroxyalprazolam        94           EXPECTED   ng/mg creat    Source of alprazolam is a scheduled prescription medication.    Alpha-hydroxyalprazolam is an expected metabolite of alprazolam.   Oxycodone                      321          EXPECTED   ng/mg creat   Oxymorphone                    297          EXPECTED   ng/mg creat   Noroxycodone  435          EXPECTED   ng/mg creat   Noroxymorphone                 121          EXPECTED   ng/mg creat    Sources of oxycodone are scheduled prescription medications.    Oxymorphone, noroxycodone, and noroxymorphone are expected    metabolites of oxycodone. Oxymorphone is also available as a    scheduled prescription medication. ==================================================================== Test                      Result    Flag   Units      Ref Range   Creatinine              154              mg/dL      >=20 ==================================================================== Declared Medications:  The flagging and interpretation on this report are based on the  following declared medications.  Unexpected results may arise from  inaccuracies in the declared medications.  **Note: The testing scope of this panel includes  these medications:  Alprazolam (Xanax)  Oxycodone (Percocet)  **Note: The testing scope of this panel does not include following  reported medications:  Acetaminophen (Percocet)  Diclofenac (Voltaren)  Multivitamin  Pantoprazole (Protonix) ==================================================================== For clinical consultation, please call [phone]. ====================================================================     Outpatient Medications Prior to Visit  Medication Sig Dispense Refill  . ALPRAZolam (XANAX) 1 MG tablet Take 1 tablet (1 mg total) by mouth 2 (two) times daily as needed for anxiety. 60 tablet 0  . diclofenac sodium (VOLTAREN) 1 % GEL Apply 4 g topically 4 (four) times daily. 100 g 0  . glucose blood (ACCU-CHEK AVIVA PLUS) test strip 1 strip by Does not apply route daily.    . Multiple Vitamin (MULTI-VITAMINS) TABS Take by mouth. Reported on 12/18/2015    . oxyCODONE-acetaminophen (PERCOCET) 10-325 MG tablet Take 1 tablet by mouth 2 (two) times daily as needed for pain. 60 tablet 0  . pantoprazole (PROTONIX) 40 MG tablet Take 1 tablet (40 mg total) by mouth 2 (two) times daily. 180 tablet 0   No facility-administered medications prior to visit.    Lab Results  Component Value Date   WBC 5.2 09/22/2014   HGB 13.5 09/22/2014   HCT 40 09/22/2014   PLT 210 09/22/2014   GLUCOSE 86 12/27/2014   CHOL 118 09/22/2014   TRIG 64 09/22/2014   HDL 49 09/22/2014   LDLCALC 56 09/22/2014   ALT 26 12/27/2014   AST 17 12/27/2014   NA 141 12/27/2014   K 4.6 12/27/2014   CL 101 12/27/2014   CREATININE 0.68 12/27/2014   BUN 9 12/27/2014   CO2 24 12/27/2014   TSH 1.76 09/22/2014   HGBA1C 4.6 11/30/2015    --------------------------------------------------------------------------------------------------------------------- Mm Digital Screening Bilateral  02/14/2015  CLINICAL DATA:  Screening. EXAM: DIGITAL SCREENING BILATERAL MAMMOGRAM WITH CAD COMPARISON:   None. ACR Breast Density Category c: The breast tissue is heterogeneously dense, which may obscure small masses FINDINGS: There are no findings suspicious for malignancy. Images were processed with CAD. IMPRESSION: No mammographic evidence of malignancy. A result letter of this screening mammogram will be mailed directly to the patient. RECOMMENDATION: Screening mammogram at age 4. (Code:SM-B-40A) BI-RADS CATEGORY  1: Negative. Electronically Signed   By: Everlean Alstrom M.D.   On: 02/14/2015 09:02       ----------------------------------------------------------------------------------------------------------------------  Past Medical History:  Diagnosis Date  . Anxiety   . Asthma   . Bipolar disorder ([place])   . Chronic fatigue   . Chronic low back pain   . Diabetes mellitus without complication ([place])   . Fatty liver disease, nonalcoholic   . History of Helicobacter pylori infection   . Hyperlipidemia   . Hypertension   . Insomnia   . Morbid obesity ([employer])   . Panic disorder   . PTSD (post-traumatic stress disorder)   . Vitamin B12 deficiency   . Vitamin D deficiency     Past Surgical History:  Procedure Laterality Date  . ABDOMINAL HYSTERECTOMY    . GASTROPLASTY DUODENAL SWITCH    . PARTIAL HYSTERECTOMY    . SALPINGECTOMY    . TUBAL LIGATION      Family History  Problem Relation Age of Onset  . Heart disease Father   . Hypertension Father   . Hyperlipidemia Father   . Drug abuse Mother   . Anxiety disorder Sister   . Depression Sister     Social History  Substance Use Topics  . Smoking status: Never Smoker  . Smokeless tobacco: Never Used  . Alcohol use No    ---------------------------------------------------------------------------------------------------------------------- Social History   Social History  . Marital status: Married    Spouse name: Michiel Cowboy  . Number of children: 2  . Years of education: N/A   Occupational History  . Customer Service     Social History Main Topics  . Smoking status: Never Smoker  . Smokeless tobacco: Never Used  . Alcohol use No  . Drug use: No  . Sexual activity: Yes    Partners: Male    Birth control/ protection: None   Other Topics Concern  . None   Social History Narrative  . None    Scheduled Meds: Continuous Infusions: PRN Meds:.   BP 110/62   Pulse 70   Temp 98.3 F (36.8 C) (Oral)   Resp 18   Ht 5\' 11"  (1.803 m)   Wt 175 lb (79.4 kg)   SpO2 100%   BMI 24.41 kg/m    BP Readings from Last 3 Encounters:  01/04/16 110/62  12/27/15 112/76  12/18/15 117/64     Wt Readings from Last 3 Encounters:  01/04/16 175 lb (79.4 kg)  12/27/15 183 lb 4.8 oz (83.1 kg)  12/18/15 175 lb (79.4 kg)     ----------------------------------------------------------------------------------------------------------------------  ROS Review of Systems  Cardiac: High blood pressure Pulmonary: Negative Exline neurologic: Negative Psychological: Anxiety depression panic attacks GI: Reflux Endocrine: Diabetes  Objective:  BP 110/62   Pulse 70   Temp 98.3 F (36.8 C) (Oral)   Resp 18   Ht 5\' 11"  (1.803 m)   Wt 175 lb (79.4 kg)   SpO2 100%   BMI 24.41 kg/m   Physical Exam Patient is a pleasant 40 year old white female in no acute distress and she is alert and oriented 3 cooperative and compliant Pupils are equally round reactive to light extraocular muscles are intact Heart is regular rate and rhythm without murmur Lungs are clear to also auscultation Inspection low back reveals some paraspinous muscle tenderness centralized in the lower back that is exacerbated by extension at the low back with right lateral rotation. This causes pain radiating into the right hip and buttocks with the patient in the supine position she has a positive straight leg raise causing pain in the right lower back at 45 but no pain radiating beyond the knee  her sensation appears to be grossly intact muscle  tone and bulk is good and strength is measured at 5 over 5 throughout the lower extremities.     Assessment & Plan:   Karrisa was seen today for back pain and hip pain.  Diagnoses and all orders for this visit:  DDD (degenerative disc disease), lumbar -     Lumbar Epidural Injection  Sciatica associated with disorder of lumbar spine, right -     Lumbar Epidural Injection -     triamcinolone acetonide (KENALOG-40) injection 40 mg; 1 mL (40 mg total) by Other route once. -     ropivacaine (PF) 2 mg/ml (0.2%) (NAROPIN) epidural 10 mL; 10 mLs by Epidural route once. -     lidocaine (PF) (XYLOCAINE) 1 % injection 5 mL; Inject 5 mLs into the skin once. -     midazolam (VERSED) injection 5 mg; Inject 5 mLs (5 mg total) into the vein once. -     iopamidol (ISOVUE-M) 41 % intrathecal injection 20 mL; 20 mLs by Other route once as needed for contrast. -     lactated ringers infusion 1,000 mL; Inject 1,000 mLs into the vein continuous. -     fentaNYL (SUBLIMAZE) injection 100 mcg; Inject 2 mLs (100 mcg total) into the vein once. -     Lumbar Epidural Injection; Future  Hip pain, chronic, right -     Lumbar Epidural Injection  Other orders -     iopamidol (ISOVUE-M) 41 % intrathecal injection;  -     midazolam (VERSED) 5 MG/5ML injection;      ----------------------------------------------------------------------------------------------------------------------  Problem List Items Addressed This Visit    None    Visit Diagnoses    DDD (degenerative disc disease), lumbar       Relevant Medications   triamcinolone acetonide (KENALOG-40) injection 40 mg (Completed)   fentaNYL (SUBLIMAZE) injection 100 mcg   Sciatica associated with disorder of lumbar spine, right       Relevant Medications   triamcinolone acetonide (KENALOG-40) injection 40 mg (Completed)   ropivacaine (PF) 2 mg/ml (0.2%) (NAROPIN) epidural 10 mL (Completed)   lidocaine (PF) (XYLOCAINE) 1 % injection 5 mL (Completed)    midazolam (VERSED) injection 5 mg   iopamidol (ISOVUE-M) 41 % intrathecal injection 20 mL   lactated ringers infusion 1,000 mL   fentaNYL (SUBLIMAZE) injection 100 mcg   midazolam (VERSED) 5 MG/5ML injection (Completed)   Other Relevant Orders   Lumbar Epidural Injection   Hip pain, chronic, right       Relevant Medications   triamcinolone acetonide (KENALOG-40) injection 40 mg (Completed)   ropivacaine (PF) 2 mg/ml (0.2%) (NAROPIN) epidural 10 mL (Completed)   lidocaine (PF) (XYLOCAINE) 1 % injection 5 mL (Completed)   fentaNYL (SUBLIMAZE) injection 100 mcg      ----------------------------------------------------------------------------------------------------------------------  1. DDD (degenerative disc disease), lumbar  - Lumbar Epidural Injection; Future  - Ambulatory referral to Physical Therapy - [facility] Select 13 (MW), Urine  2. Sciatica associated with disorder of lumbar spine, right  - Lumbar Epidural Injection; Future For the positive straight leg raise and radicular symptoms she's having affecting the right L5 distribution. - Ambulatory referral to Physical Therapy - [facility] Select 13 (MW), Urine  3. Hip pain, chronic, right  - Lumbar Epidural Injection; Future - Ambulatory referral to Physical Therapy - [facility] Select 13 (MW), Urine  4. Facet arthritis of lumbosacral region I'm hopeful that the epidural steroid injection will help with some of the facet genic  pain she is also experiencing and she may be a candidate for a facet injection at a future visit. - Ambulatory referral to Physical Therapy - [facility] Select 13 (MW), Urine    ----------------------------------------------------------------------------------------------------------------------  I am having Ms. Zoll maintain her glucose blood, pantoprazole, MULTI-VITAMINS, diclofenac sodium, oxyCODONE-acetaminophen, and ALPRAZolam. We administered triamcinolone acetonide, ropivacaine (PF) 2  mg/ml (0.2%), lidocaine (PF), iopamidol, and midazolam. We will continue to administer midazolam, iopamidol, lactated ringers, and fentaNYL.   Meds ordered this encounter  Medications  . triamcinolone acetonide (KENALOG-40) injection 40 mg  . ropivacaine (PF) 2 mg/ml (0.2%) (NAROPIN) epidural 10 mL  . lidocaine (PF) (XYLOCAINE) 1 % injection 5 mL  . midazolam (VERSED) injection 5 mg  . iopamidol (ISOVUE-M) 41 % intrathecal injection 20 mL  . lactated ringers infusion 1,000 mL  . fentaNYL (SUBLIMAZE) injection 100 mcg  . iopamidol (ISOVUE-M) 41 % intrathecal injection    Willeen Cass L: cabinet override  . midazolam (VERSED) 5 MG/5ML injection    Willeen Cass L: cabinet override       Follow-up: Return in about 1 month (around 02/04/2016) for procedure, evaluation.    Molli Barrows, MD  This dictation was performed utilizing Dragon voice recognition software.  Please excuse any unintentional or mistaken typographical errors as a result of its unedited utilization.

## 2016-01-04 NOTE — Patient Instructions (Signed)
Pain Management Discharge Instructions  General Discharge Instructions :  If you need to reach your doctor call: Monday-Friday 8:00 am - 4:00 pm at [phone] or toll free [phone].  After clinic hours [phone] to have operator reach doctor.  Bring all of your medication bottles to all your appointments in the pain clinic.  To cancel or reschedule your appointment with Pain Management please remember to call 24 hours in advance to avoid a fee.  Refer to the educational materials which you have been given on: General Risks, I had my Procedure. Discharge Instructions, Post Sedation.  Post Procedure Instructions:  The drugs you were given will stay in your system until tomorrow, so for the next 24 hours you should not drive, make any legal decisions or drink any alcoholic beverages.  You may eat anything you prefer, but it is better to start with liquids then soups and crackers, and gradually work up to solid foods.  Please notify your doctor immediately if you have any unusual bleeding, trouble breathing or pain that is not related to your normal pain.  Depending on the type of procedure that was done, some parts of your body may feel week and/or numb.  This usually clears up by tonight or the next day.  Walk with the use of an assistive device or accompanied by an adult for the 24 hours.  You may use ice on the affected area for the first 24 hours.  Put ice in a Ziploc bag and cover with a towel and place against area 15 minutes on 15 minutes off.  You may switch to heat after 24 hours.Epidural Steroid Injection An epidural steroid injection is given to relieve pain in your neck, back, or legs that is caused by the irritation or swelling of a nerve root. This procedure involves injecting a steroid and numbing medicine (anesthetic) into the epidural space. The epidural space is the space between the outer covering of your spinal cord and the bones that form your backbone  (vertebra).  LET YOUR HEALTH CARE PROVIDER KNOW ABOUT:   Any allergies you have.  All medicines you are taking, including vitamins, herbs, eye drops, creams, and over-the-counter medicines such as aspirin.  Previous problems you or members of your family have had with the use of anesthetics.  Any blood disorders or blood clotting disorders you have.  Previous surgeries you have had.  Medical conditions you have. RISKS AND COMPLICATIONS Generally, this is a safe procedure. However, as with any procedure, complications can occur. Possible complications of epidural steroid injection include:  Headache.  Bleeding.  Infection.  Allergic reaction to the medicines.  Damage to your nerves. The response to this procedure depends on the underlying cause of the pain and its duration. People who have long-term (chronic) pain are less likely to benefit from epidural steroids than are those people whose pain comes on strong and suddenly. BEFORE THE PROCEDURE   Ask your health care provider about changing or stopping your regular medicines. You may be advised to stop taking blood-thinning medicines a few days before the procedure.  You may be given medicines to reduce anxiety.  Arrange for someone to take you home after the procedure. PROCEDURE   You will remain awake during the procedure. You may receive medicine to make you relaxed.  You will be asked to lie on your stomach.  The injection site will be cleaned.  The injection site will be numbed with a medicine (local anesthetic).  A needle will be   injected through your skin into the epidural space.  Your health care provider will use an X-ray machine to ensure that the steroid is delivered closest to the affected nerve. You may have minimal discomfort at this time.  Once the needle is in the right position, the local anesthetic and the steroid will be injected into the epidural space.  The needle will then be removed and a  bandage will be applied to the injection site. AFTER THE PROCEDURE   You may be monitored for a short time before you go home.  You may feel weakness or numbness in your arm or leg, which disappears within hours.  You may be allowed to eat, drink, and take your regular medicine.  You may have soreness at the site of the injection.   This information is not intended to replace advice given to you by your health care provider. Make sure you discuss any questions you have with your health care provider.   Document Released: 08/27/2007 Document Revised: 01/20/2013 Document Reviewed: 11/06/2012 Elsevier Interactive Patient Education 2016 [employer]  

## 2016-01-04 NOTE — Progress Notes (Signed)
Safety precautions to be maintained throughout the outpatient stay will include: orient to surroundings, keep bed in low position, maintain call bell within reach at all times, provide assistance with transfer out of bed and ambulation.  

## 2016-01-04 NOTE — Progress Notes (Signed)
Subjective:  Patient ID: Kiara Le, female    DOB: Dec 16, 1975  Age: 40 y.o. MRN: [id number]  CC: Back Pain and Hip Pain (right)   Service Provided on Last Visit: Evaluation (new patient evaluation)  PROCEDURE:L5-S1 epidural steroid No. 1 under fluoroscopic guidance with moderate sedation  HPI Kiara Le presents for a re- evaluation. She is a pleasant 40 year old white female with a long-standing history of low back pain. She reports a pain that is primarily centralized in the low back that is worse with extension sitting standing squatting and walking for prolonged periods of time. Bending and climbing also seemed to aggravate her pain. Cold  packs and medication management in addition to warm showers have helped alleviate pain. He primarily stay centralized in the low back with radiation into the right lateral hip and posterior leg with no associated problems with bowel or bladder dysfunction. She has no tingling affecting the lower legs but does have bilateral knee pain. The pain occasionally radiates into the posterior right thigh it is sharp in nature and she is status post an MRI showing evidence of some facet genic disease And mild degenerative disc disease at L5-S1. She furthermore states that her strength is been well preserved other than the chronic pain in the right lower back  History Velva has a past medical history of Anxiety; Asthma; Bipolar disorder ([place]); Chronic fatigue; Chronic low back pain; Diabetes mellitus without complication ([place]); Fatty liver disease, nonalcoholic; History of Helicobacter pylori infection; Hyperlipidemia; Hypertension; Insomnia; Morbid obesity ([place]); Panic disorder; PTSD (post-traumatic stress disorder); Vitamin B12 deficiency; and Vitamin D deficiency.   She has a past surgical history that includes Tubal ligation; Partial hysterectomy; Gastroplasty duodenal switch; Salpingectomy; and Abdominal hysterectomy.   Her family history includes  Anxiety disorder in her sister; Depression in her sister; Drug abuse in her mother; Heart disease in her father; Hyperlipidemia in her father; Hypertension in her father.She reports that she has never smoked. She has never used smokeless tobacco. She reports that she does not drink alcohol or use drugs.            [facility] Select 13  Date Value Ref Range Status  12/18/2015 FINAL  Final    Comment:    ==================================================================== [facility] SELECT 13 ([facility]) ==================================================================== Test                             Result       Flag       Units Drug Present and Declared for Prescription Verification   Alprazolam                     94           EXPECTED   ng/mg creat   Alpha-hydroxyalprazolam        94           EXPECTED   ng/mg creat    Source of alprazolam is a scheduled prescription medication.    Alpha-hydroxyalprazolam is an expected metabolite of alprazolam.   Oxycodone                      321          EXPECTED   ng/mg creat   Oxymorphone                    297  EXPECTED   ng/mg creat   Noroxycodone                   435          EXPECTED   ng/mg creat   Noroxymorphone                 121          EXPECTED   ng/mg creat    Sources of oxycodone are scheduled prescription medications.    Oxymorphone, noroxycodone, and noroxymorphone are expected    metabolites of oxycodone. Oxymorphone is also available as a    scheduled prescription medication. ==================================================================== Test                      Result    Flag   Units      Ref Range   Creatinine              154              mg/dL      >=20 ==================================================================== Declared Medications:  The flagging and interpretation on this report are based on the  following declared medications.  Unexpected results may arise from  inaccuracies in the declared  medications.  **Note: The testing scope of this panel includes these medications:  Alprazolam (Xanax)  Oxycodone (Percocet)  **Note: The testing scope of this panel does not include following  reported medications:  Acetaminophen (Percocet)  Diclofenac (Voltaren)  Multivitamin  Pantoprazole (Protonix) ==================================================================== For clinical consultation, please call [phone]. ====================================================================     Outpatient Medications Prior to Visit  Medication Sig Dispense Refill  . ALPRAZolam (XANAX) 1 MG tablet Take 1 tablet (1 mg total) by mouth 2 (two) times daily as needed for anxiety. 60 tablet 0  . diclofenac sodium (VOLTAREN) 1 % GEL Apply 4 g topically 4 (four) times daily. 100 g 0  . glucose blood (ACCU-CHEK AVIVA PLUS) test strip 1 strip by Does not apply route daily.    . Multiple Vitamin (MULTI-VITAMINS) TABS Take by mouth. Reported on 12/18/2015    . oxyCODONE-acetaminophen (PERCOCET) 10-325 MG tablet Take 1 tablet by mouth 2 (two) times daily as needed for pain. 60 tablet 0  . pantoprazole (PROTONIX) 40 MG tablet Take 1 tablet (40 mg total) by mouth 2 (two) times daily. 180 tablet 0   No facility-administered medications prior to visit.    Lab Results  Component Value Date   WBC 5.2 09/22/2014   HGB 13.5 09/22/2014   HCT 40 09/22/2014   PLT 210 09/22/2014   GLUCOSE 86 12/27/2014   CHOL 118 09/22/2014   TRIG 64 09/22/2014   HDL 49 09/22/2014   LDLCALC 56 09/22/2014   ALT 26 12/27/2014   AST 17 12/27/2014   NA 141 12/27/2014   K 4.6 12/27/2014   CL 101 12/27/2014   CREATININE 0.68 12/27/2014   BUN 9 12/27/2014   CO2 24 12/27/2014   TSH 1.76 09/22/2014   HGBA1C 4.6 11/30/2015    --------------------------------------------------------------------------------------------------------------------- Mm Digital Screening Bilateral  02/14/2015  CLINICAL DATA:  Screening. EXAM:  DIGITAL SCREENING BILATERAL MAMMOGRAM WITH CAD COMPARISON:  None. ACR Breast Density Category c: The breast tissue is heterogeneously dense, which may obscure small masses FINDINGS: There are no findings suspicious for malignancy. Images were processed with CAD. IMPRESSION: No mammographic evidence of malignancy. A result letter of this screening mammogram will be mailed directly to the patient. RECOMMENDATION: Screening mammogram at age  42. (Code:SM-B-40A) BI-RADS CATEGORY  1: Negative. Electronically Signed   By: Everlean Alstrom M.D.   On: 02/14/2015 09:02       ---------------------------------------------------------------------------------------------------------------------- Past Medical History:  Diagnosis Date  . Anxiety   . Asthma   . Bipolar disorder ([place])   . Chronic fatigue   . Chronic low back pain   . Diabetes mellitus without complication ([place])   . Fatty liver disease, nonalcoholic   . History of Helicobacter pylori infection   . Hyperlipidemia   . Hypertension   . Insomnia   . Morbid obesity ([place])   . Panic disorder   . PTSD (post-traumatic stress disorder)   . Vitamin B12 deficiency   . Vitamin D deficiency     Past Surgical History:  Procedure Laterality Date  . ABDOMINAL HYSTERECTOMY    . GASTROPLASTY DUODENAL SWITCH    . PARTIAL HYSTERECTOMY    . SALPINGECTOMY    . TUBAL LIGATION      Family History  Problem Relation Age of Onset  . Heart disease Father   . Hypertension Father   . Hyperlipidemia Father   . Drug abuse Mother   . Anxiety disorder Sister   . Depression Sister     Social History  Substance Use Topics  . Smoking status: Never Smoker  . Smokeless tobacco: Never Used  . Alcohol use No    ---------------------------------------------------------------------------------------------------------------------- Social History   Social History  . Marital status: Married    Spouse name: Michiel Cowboy  . Number of children: 2  . Years of  education: N/A   Occupational History  . Customer Service    Social History Main Topics  . Smoking status: Never Smoker  . Smokeless tobacco: Never Used  . Alcohol use No  . Drug use: No  . Sexual activity: Yes    Partners: Male    Birth control/ protection: None   Other Topics Concern  . None   Social History Narrative  . None    Scheduled Meds: Continuous Infusions: PRN Meds:.   BP 110/62   Pulse 70   Temp 98.3 F (36.8 C) (Oral)   Resp 18   Ht 5\' 11"  (1.803 m)   Wt 175 lb (79.4 kg)   SpO2 100%   BMI 24.41 kg/m    BP Readings from Last 3 Encounters:  01/04/16 110/62  12/27/15 112/76  12/18/15 117/64     Wt Readings from Last 3 Encounters:  01/04/16 175 lb (79.4 kg)  12/27/15 183 lb 4.8 oz (83.1 kg)  12/18/15 175 lb (79.4 kg)     ----------------------------------------------------------------------------------------------------------------------  ROS Review of Systems  Cardiac: High blood pressure Pulmonary: Negative Exline neurologic: Negative Psychological: Anxiety depression panic attacks GI: Reflux Endocrine: Diabetes  Objective:  BP 110/62   Pulse 70   Temp 98.3 F (36.8 C) (Oral)   Resp 18   Ht 5\' 11"  (1.803 m)   Wt 175 lb (79.4 kg)   SpO2 100%   BMI 24.41 kg/m   Physical Exam Patient is a pleasant 40 year old white female in no acute distress and she is alert and oriented 3 cooperative and compliant Pupils are equally round reactive to light extraocular muscles are intact Heart is regular rate and rhythm without murmur Lungs are clear to also auscultation Inspection low back reveals some paraspinous muscle tenderness centralized in the lower back that is exacerbated by extension at the low back with right lateral rotation. This causes pain radiating into the right hip and buttocks with the  patient in the supine position she has a positive straight leg raise causing pain in the right lower back at 45 but no pain radiating beyond  the knee her sensation appears to be grossly intact muscle tone and bulk is good and strength is measured at 5 over 5 throughout the lower extremities.     Assessment & Plan:   Tamyah was seen today for back pain and hip pain.  Diagnoses and all orders for this visit:  DDD (degenerative disc disease), lumbar -     Lumbar Epidural Injection  Sciatica associated with disorder of lumbar spine, right -     Lumbar Epidural Injection -     triamcinolone acetonide (KENALOG-40) injection 40 mg; 1 mL (40 mg total) by Other route once. -     ropivacaine (PF) 2 mg/ml (0.2%) (NAROPIN) epidural 10 mL; 10 mLs by Epidural route once. -     lidocaine (PF) (XYLOCAINE) 1 % injection 5 mL; Inject 5 mLs into the skin once. -     midazolam (VERSED) injection 5 mg; Inject 5 mLs (5 mg total) into the vein once. -     iopamidol (ISOVUE-M) 41 % intrathecal injection 20 mL; 20 mLs by Other route once as needed for contrast. -     lactated ringers infusion 1,000 mL; Inject 1,000 mLs into the vein continuous. -     fentaNYL (SUBLIMAZE) injection 100 mcg; Inject 2 mLs (100 mcg total) into the vein once. -     Lumbar Epidural Injection; Future  Hip pain, chronic, right -     Lumbar Epidural Injection  Other orders -     iopamidol (ISOVUE-M) 41 % intrathecal injection;  -     midazolam (VERSED) 5 MG/5ML injection;      ----------------------------------------------------------------------------------------------------------------------  Problem List Items Addressed This Visit    None    Visit Diagnoses    DDD (degenerative disc disease), lumbar       Relevant Medications   triamcinolone acetonide (KENALOG-40) injection 40 mg (Completed)   fentaNYL (SUBLIMAZE) injection 100 mcg   Sciatica associated with disorder of lumbar spine, right       Relevant Medications   triamcinolone acetonide (KENALOG-40) injection 40 mg (Completed)   ropivacaine (PF) 2 mg/ml (0.2%) (NAROPIN) epidural 10 mL (Completed)    lidocaine (PF) (XYLOCAINE) 1 % injection 5 mL (Completed)   midazolam (VERSED) injection 5 mg   iopamidol (ISOVUE-M) 41 % intrathecal injection 20 mL   lactated ringers infusion 1,000 mL   fentaNYL (SUBLIMAZE) injection 100 mcg   midazolam (VERSED) 5 MG/5ML injection (Completed)   Other Relevant Orders   Lumbar Epidural Injection   Hip pain, chronic, right       Relevant Medications   triamcinolone acetonide (KENALOG-40) injection 40 mg (Completed)   ropivacaine (PF) 2 mg/ml (0.2%) (NAROPIN) epidural 10 mL (Completed)   lidocaine (PF) (XYLOCAINE) 1 % injection 5 mL (Completed)   fentaNYL (SUBLIMAZE) injection 100 mcg      ----------------------------------------------------------------------------------------------------------------------  1. DDD (degenerative disc disease), lumbar  - Lumbar Epidural Injection; The risks and benefits of an once again reviewed and all questions answered - Ambulatory referral to Physical Therapy - [facility] Select 13 ([facility]), Urine  2. Sciatica associated with disorder of lumbar spine, right  - Lumbar Epidural Injection;For the positive straight leg raise and radicular symptoms she's having affecting the right L5 distribution. - Ambulatory referral to Physical Therapy - [facility] Select 13 ([facility]), Urine  3. Hip pain, chronic, right  - Lumbar  Epidural Injection; Future - Ambulatory referral to Physical Therapy - [facility] Select 13 ([facility]), Urine  4. Facet arthritis of lumbosacral region I'm hopeful that the epidural steroid injection will help with some of the facet genic pain she is also experiencing and she may be a candidate for a facet injection at a future visit. - Ambulatory referral to Physical Therapy - [facility] Select 13 ([facility]), Urine    ----------------------------------------------------------------------------------------------------------------------  I am having Ms. Harman maintain her glucose blood, pantoprazole, MULTI-VITAMINS,  diclofenac sodium, oxyCODONE-acetaminophen, and ALPRAZolam. We administered triamcinolone acetonide, ropivacaine (PF) 2 mg/ml (0.2%), lidocaine (PF), iopamidol, and midazolam. We will continue to administer midazolam, iopamidol, lactated ringers, and fentaNYL.   Meds ordered this encounter  Medications  . triamcinolone acetonide (KENALOG-40) injection 40 mg  . ropivacaine (PF) 2 mg/ml (0.2%) (NAROPIN) epidural 10 mL  . lidocaine (PF) (XYLOCAINE) 1 % injection 5 mL  . midazolam (VERSED) injection 5 mg  . iopamidol (ISOVUE-M) 41 % intrathecal injection 20 mL  . lactated ringers infusion 1,000 mL  . fentaNYL (SUBLIMAZE) injection 100 mcg  . iopamidol (ISOVUE-M) 41 % intrathecal injection    Willeen Cass L: cabinet override  . midazolam (VERSED) 5 MG/5ML injection    Willeen Cass L: cabinet override    Procedure: Procedure: L5-S1 LESI with fluoroscopic guidance and moderate sedation  NOTE: The risks, benefits, and expectations of the procedure have been discussed and explained to the patient who was understanding and in agreement with suggested treatment plan. No guarantees were made.  DESCRIPTION OF PROCEDURE: Lumbar epidural steroid injection with IV Versed, EKG, blood pressure, pulse, and pulse oximetry monitoring. The procedure was performed with the patient in the prone position under fluoroscopic guidance. A local anesthetic skin wheal of 1.5% plain lidocaine was performed at the appropriate site after fluoroscopic identifictation  Using strict aseptic technique, I then advanced an 18-gauge Tuohy epidural needle in the midline via loss-of-resistance to saline technique. There was negative aspiration for heme or  CSF.  I then confirmed position with both AP and Lateral fluoroscan. At L5-S1  A total of 5 mL of Preservative-Free normal saline mixed with 40 mg of Kenalog and 1cc Ropicaine 0.2 percent was injected incrementally via the  epidurally placed needle. The needle was removed. The  patient tolerated the injection well and was convalesced and discharged to home in stable condition. Should the patient have any post procedure difficulty they have been instructed on how to contact us for assistance.     Follow-up: Return in about 1 month (around 02/04/2016) for procedure, evaluation.    Molli Barrows, MD  This dictation was performed utilizing Dragon voice recognition software.  Please excuse any unintentional or mistaken typographical errors as a result of its unedited utilization.

## 2016-01-05 ENCOUNTER — Telehealth: Payer: Self-pay

## 2016-01-05 NOTE — Telephone Encounter (Signed)
Post procedure phone call.  Left message.  

## 2016-01-30 ENCOUNTER — Ambulatory Visit (INDEPENDENT_AMBULATORY_CARE_PROVIDER_SITE_OTHER): Payer: PRIVATE HEALTH INSURANCE | Admitting: Family Medicine

## 2016-01-30 ENCOUNTER — Encounter: Payer: Self-pay | Admitting: Family Medicine

## 2016-01-30 VITALS — BP 112/63 | HR 85 | Temp 98.6°F | Resp 15 | Ht 71.0 in | Wt 180.3 lb

## 2016-01-30 DIAGNOSIS — M545 Low back pain: Secondary | ICD-10-CM | POA: Diagnosis not present

## 2016-01-30 DIAGNOSIS — G8929 Other chronic pain: Secondary | ICD-10-CM

## 2016-01-30 DIAGNOSIS — F4001 Agoraphobia with panic disorder: Secondary | ICD-10-CM

## 2016-01-30 DIAGNOSIS — R252 Cramp and spasm: Secondary | ICD-10-CM | POA: Diagnosis not present

## 2016-01-30 MED ORDER — ALPRAZOLAM 1 MG PO TABS: 1.0000 mg | ORAL_TABLET | Freq: Two times a day (BID) | ORAL | 0 refills | Status: DC | PRN

## 2016-01-30 MED ORDER — OXYCODONE-ACETAMINOPHEN 10-325 MG PO TABS: 1.0000 | ORAL_TABLET | Freq: Two times a day (BID) | ORAL | 0 refills | Status: DC | PRN

## 2016-01-30 NOTE — Progress Notes (Signed)
Name: Kiara Le   MRN: [id number]    DOB: [birth year]   Date:01/30/2016       Progress Note  Subjective  Chief Complaint  Chief Complaint  Patient presents with  . Follow-up    1 mo  . Medication Refill    HPI  Anxiety: Pt. Presents for follow up and medication refills on Alprazolam 1 mg two times daily as needed. She has been feeling more anxious recently after receiving injection to her lower back which she believes is causing her leg cramps which is making her anxiety worse. Alprazolam helps the panic attacks.  Back Pain: Patient presents for follow up and medication refill. She takes Percocet 10-325 mg two times daily as needed for low back pain. Low back pain rated at 3/10, right hip is bothering her more than her back. She received injections in her back from the pain clinic and it caused her to have leg cramps. Scheduled to receive the second injection next month.   Past Medical History:  Diagnosis Date  . Anxiety   . Asthma   . Bipolar disorder ([place])   . Chronic fatigue   . Chronic low back pain   . Diabetes mellitus without complication ([place])   . Fatty liver disease, nonalcoholic   . History of Helicobacter pylori infection   . Hyperlipidemia   . Hypertension   . Insomnia   . Morbid obesity ([place])   . Panic disorder   . PTSD (post-traumatic stress disorder)   . Vitamin B12 deficiency   . Vitamin D deficiency     Past Surgical History:  Procedure Laterality Date  . ABDOMINAL HYSTERECTOMY    . GASTROPLASTY DUODENAL SWITCH    . PARTIAL HYSTERECTOMY    . SALPINGECTOMY    . TUBAL LIGATION      Family History  Problem Relation Age of Onset  . Heart disease Father   . Hypertension Father   . Hyperlipidemia Father   . Drug abuse Mother   . Anxiety disorder Sister   . Depression Sister     Social History   Social History  . Marital status: Married    Spouse name: Michiel Cowboy  . Number of children: 2  . Years of education: N/A   Occupational History    . Customer Service    Social History Main Topics  . Smoking status: Never Smoker  . Smokeless tobacco: Never Used  . Alcohol use No  . Drug use: No  . Sexual activity: Yes    Partners: Male    Birth control/ protection: None   Other Topics Concern  . Not on file   Social History Narrative  . No narrative on file     Current Outpatient Prescriptions:  .  ALPRAZolam (XANAX) 1 MG tablet, Take 1 tablet (1 mg total) by mouth 2 (two) times daily as needed for anxiety., Disp: 60 tablet, Rfl: 0 .  diclofenac sodium (VOLTAREN) 1 % GEL, Apply 4 g topically 4 (four) times daily., Disp: 100 g, Rfl: 0 .  glucose blood (ACCU-CHEK AVIVA PLUS) test strip, 1 strip by Does not apply route daily., Disp: , Rfl:  .  Multiple Vitamin (MULTI-VITAMINS) TABS, Take by mouth. Reported on 12/18/2015, Disp: , Rfl:  .  oxyCODONE-acetaminophen (PERCOCET) 10-325 MG tablet, Take 1 tablet by mouth 2 (two) times daily as needed for pain., Disp: 60 tablet, Rfl: 0 .  pantoprazole (PROTONIX) 40 MG tablet, Take 1 tablet (40 mg total) by mouth 2 (two) times daily.,  Disp: 180 tablet, Rfl: 0  Current Facility-Administered Medications:  .  fentaNYL (SUBLIMAZE) injection 100 mcg, 100 mcg, Intravenous, Once, Molli Barrows, MD .  iopamidol (ISOVUE-M) 41 % intrathecal injection 20 mL, 20 mL, Other, Once PRN, Molli Barrows, MD .  lactated ringers infusion 1,000 mL, 1,000 mL, Intravenous, Continuous, Molli Barrows, MD .  midazolam (VERSED) injection 5 mg, 5 mg, Intravenous, Once, Molli Barrows, MD  Allergies  Allergen Reactions  . 2,4-D Dimethylamine (Amisol) Swelling  . Nsaids Other (See Comments)    Cannot take due to abd surgery  . Tetanus Toxoids     Swelling, hot     Review of Systems  Constitutional: Negative for chills and fever.  Musculoskeletal: Positive for back pain, joint pain and myalgias.  Psychiatric/Behavioral: The patient is nervous/anxious.      Objective  Vitals:   01/30/16 0855  BP: 112/63   Pulse: 85  Resp: 15  Temp: 98.6 F (37 C)  TempSrc: Oral  SpO2: 96%  Weight: 180 lb 4.8 oz (81.8 kg)  Height: 5\' 11"  (1.803 m)    Physical Exam  Constitutional: She is oriented to person, place, and time and well-developed, well-nourished, and in no distress.  HENT:  Head: Normocephalic and atraumatic.  Cardiovascular: Normal rate, regular rhythm and normal heart sounds.   No murmur heard. Pulmonary/Chest: Effort normal and breath sounds normal. She has no wheezes.  Musculoskeletal:       Lumbar back: She exhibits tenderness, pain and spasm.       Back:  Neurological: She is alert and oriented to person, place, and time.  Psychiatric: Mood, memory, affect and judgment normal.  Nursing note and vitals reviewed.    Assessment & Plan  1. Chronic right-sided low back pain without sciatica Responsive to opioid therapy, also receiving injections to her lower back by the pain clinic. Refills for Percocet provided. - oxyCODONE-acetaminophen (PERCOCET) 10-325 MG tablet; Take 1 tablet by mouth 2 (two) times daily as needed for pain.  Dispense: 60 tablet; Refill: 0  2. Panic disorder with agoraphobia and moderate panic attacks Stable but acutely worse due to pain in lower back. She is taking alprazolam 1 mg twice a day when necessary, this helps lower her anxiety. Patient compliant with controlled substances agreement and understands the dependence potential, side effects, and drug interactions of benzodiazepines. Refills provided and follow-up in one month - ALPRAZolam (XANAX) 1 MG tablet; Take 1 tablet (1 mg total) by mouth 2 (two) times daily as needed for anxiety.  Dispense: 60 tablet; Refill: 0  3. Cramp of both lower extremities Likely associated with injection in the lower back received at the pain clinic. Should improve within a few days, patient advised to contact pain clinic for further advice.  Marlow Hendrie [facility] 01/30/2016 9:02 AM

## 2016-02-14 ENCOUNTER — Ambulatory Visit: Payer: PRIVATE HEALTH INSURANCE | Admitting: Anesthesiology

## 2016-02-28 ENCOUNTER — Ambulatory Visit (INDEPENDENT_AMBULATORY_CARE_PROVIDER_SITE_OTHER): Payer: PRIVATE HEALTH INSURANCE | Admitting: Family Medicine

## 2016-02-28 ENCOUNTER — Encounter: Payer: Self-pay | Admitting: Family Medicine

## 2016-02-28 VITALS — BP 114/73 | HR 79 | Temp 98.2°F | Resp 16 | Ht 71.0 in | Wt 183.5 lb

## 2016-02-28 DIAGNOSIS — K648 Other hemorrhoids: Secondary | ICD-10-CM

## 2016-02-28 DIAGNOSIS — M25551 Pain in right hip: Secondary | ICD-10-CM

## 2016-02-28 DIAGNOSIS — M129 Arthropathy, unspecified: Secondary | ICD-10-CM

## 2016-02-28 DIAGNOSIS — Z23 Encounter for immunization: Secondary | ICD-10-CM

## 2016-02-28 DIAGNOSIS — M1711 Unilateral primary osteoarthritis, right knee: Secondary | ICD-10-CM

## 2016-02-28 DIAGNOSIS — M545 Low back pain, unspecified: Secondary | ICD-10-CM

## 2016-02-28 DIAGNOSIS — F4001 Agoraphobia with panic disorder: Secondary | ICD-10-CM

## 2016-02-28 DIAGNOSIS — K644 Residual hemorrhoidal skin tags: Secondary | ICD-10-CM

## 2016-02-28 DIAGNOSIS — G8929 Other chronic pain: Secondary | ICD-10-CM

## 2016-02-28 MED ORDER — ALPRAZOLAM 1 MG PO TABS: 1.0000 mg | ORAL_TABLET | Freq: Two times a day (BID) | ORAL | 0 refills | Status: DC | PRN

## 2016-02-28 MED ORDER — DICLOFENAC SODIUM 1 % TD GEL: 4.0000 g | Freq: Four times a day (QID) | TRANSDERMAL | 0 refills | Status: DC

## 2016-02-28 MED ORDER — PRAMOXINE-HC 1-2.5 % EX CREA: TOPICAL_CREAM | Freq: Three times a day (TID) | CUTANEOUS | 0 refills | Status: DC

## 2016-02-28 MED ORDER — OXYCODONE-ACETAMINOPHEN 10-325 MG PO TABS: 1.0000 | ORAL_TABLET | Freq: Two times a day (BID) | ORAL | 0 refills | Status: DC | PRN

## 2016-02-28 NOTE — Progress Notes (Signed)
Name: Kiara Le   MRN: [id number]    DOB: 1976/03/07   Date:02/28/2016       Progress Note  Subjective  Chief Complaint  Chief Complaint  Patient presents with  . Follow-up    1 mo  . Medication Refill    Anxiety  Presents for follow-up visit. Symptoms include depressed mood, excessive worry, nervous/anxious behavior, panic and shortness of breath (with panic attacks.).    Back Pain  This is a chronic problem. The pain is present in the lumbar spine and gluteal. The pain is at a severity of 3/10. She has tried analgesics for the symptoms.    Rectal Pain and Bleeding: Pt. Has been experiencing multiple bowel movements a day since her gastric bypass 3 years ago, frequency ranges from 2-3 times on a  'good day' to 5-7 times daily. She has noticed pain during bowel movement and blood afterwards and believes that she may have developed Hemorrhoids. She has been informed by her bariatric surgeon to take multivitamins regularly which should help to have a normal bowel movement.    Past Medical History:  Diagnosis Date  . Anxiety   . Asthma   . Bipolar disorder ([place])   . Chronic fatigue   . Chronic low back pain   . Diabetes mellitus without complication ([place])   . Fatty liver disease, nonalcoholic   . History of Helicobacter pylori infection   . Hyperlipidemia   . Hypertension   . Insomnia   . Morbid obesity ([place])   . Panic disorder   . PTSD (post-traumatic stress disorder)   . Vitamin B12 deficiency   . Vitamin D deficiency     Past Surgical History:  Procedure Laterality Date  . ABDOMINAL HYSTERECTOMY    . GASTROPLASTY DUODENAL SWITCH    . PARTIAL HYSTERECTOMY    . SALPINGECTOMY    . TUBAL LIGATION      Family History  Problem Relation Age of Onset  . Heart disease Father   . Hypertension Father   . Hyperlipidemia Father   . Drug abuse Mother   . Anxiety disorder Sister   . Depression Sister     Social History   Social History  . Marital status:  Married    Spouse name: Michiel Cowboy  . Number of children: 2  . Years of education: N/A   Occupational History  . Customer Service    Social History Main Topics  . Smoking status: Never Smoker  . Smokeless tobacco: Never Used  . Alcohol use No  . Drug use: No  . Sexual activity: Yes    Partners: Male    Birth control/ protection: None   Other Topics Concern  . Not on file   Social History Narrative  . No narrative on file     Current Outpatient Prescriptions:  .  ALPRAZolam (XANAX) 1 MG tablet, Take 1 tablet (1 mg total) by mouth 2 (two) times daily as needed for anxiety., Disp: 60 tablet, Rfl: 0 .  diclofenac sodium (VOLTAREN) 1 % GEL, Apply 4 g topically 4 (four) times daily., Disp: 100 g, Rfl: 0 .  glucose blood (ACCU-CHEK AVIVA PLUS) test strip, 1 strip by Does not apply route daily., Disp: , Rfl:  .  Multiple Vitamin (MULTI-VITAMINS) TABS, Take by mouth. Reported on 12/18/2015, Disp: , Rfl:  .  oxyCODONE-acetaminophen (PERCOCET) 10-325 MG tablet, Take 1 tablet by mouth 2 (two) times daily as needed for pain., Disp: 60 tablet, Rfl: 0 .  pantoprazole (PROTONIX)  40 MG tablet, Take 1 tablet (40 mg total) by mouth 2 (two) times daily., Disp: 180 tablet, Rfl: 0  Current Facility-Administered Medications:  .  fentaNYL (SUBLIMAZE) injection 100 mcg, 100 mcg, Intravenous, Once, Molli Barrows, MD .  iopamidol (ISOVUE-M) 41 % intrathecal injection 20 mL, 20 mL, Other, Once PRN, Molli Barrows, MD .  lactated ringers infusion 1,000 mL, 1,000 mL, Intravenous, Continuous, Molli Barrows, MD .  midazolam (VERSED) injection 5 mg, 5 mg, Intravenous, Once, Molli Barrows, MD  Allergies  Allergen Reactions  . 2,4-D Dimethylamine (Amisol) Swelling  . Nsaids Other (See Comments)    Cannot take due to abd surgery  . Tetanus Toxoids     Swelling, hot     Review of Systems  Respiratory: Positive for shortness of breath (with panic attacks.).   Musculoskeletal: Positive for back pain.    Psychiatric/Behavioral: The patient is nervous/anxious.     Objective  Vitals:   02/28/16 1037  BP: 114/73  Pulse: 79  Resp: 16  Temp: 98.2 F (36.8 C)  TempSrc: Oral  SpO2: 96%  Weight: 183 lb 8 oz (83.2 kg)  Height: 5\' 11"  (1.803 m)    Physical Exam  Constitutional: She is oriented to person, place, and time and well-developed, well-nourished, and in no distress.  HENT:  Head: Normocephalic and atraumatic.  Cardiovascular: Normal rate, regular rhythm, S1 normal and S2 normal.   Pulmonary/Chest: Effort normal and breath sounds normal. She has no wheezes.  Genitourinary: Rectal exam shows external hemorrhoid. Rectal exam shows guaiac negative stool.  Genitourinary Comments: External non-thrombosed hemorrhoid at 6 o'clock position, mild tenderness to palpation, no bleeding, stool guaiac negative.  Musculoskeletal:       Right hip: She exhibits tenderness.       Lumbar back: She exhibits tenderness, pain and spasm.       Back:       Legs: Tenderness to palpation over the right gluteal area.  Neurological: She is alert and oriented to person, place, and time.  Nursing note and vitals reviewed.     Assessment & Plan  1. Arthritis of right knee Applying Voltaren gel every other day, effective in relieving knee pain - diclofenac sodium (VOLTAREN) 1 % GEL; Apply 4 g topically 4 (four) times daily.  Dispense: 100 g; Refill: 0  2. Chronic right-sided low back pain without sciatica Stable, responsive to opioid therapy, patient compliant with controlled substance agreement. Refills provided - oxyCODONE-acetaminophen (PERCOCET) 10-325 MG tablet; Take 1 tablet by mouth 2 (two) times daily as needed for pain.  Dispense: 60 tablet; Refill: 0  3. Panic disorder with agoraphobia and moderate panic attacks Taking alprazolam 1 mg twice a day when necessary, compliant with controlled substances agreement. Aware of the dependence potential, side effects, and drug interactions of  benzodiazepines. Refills provided - ALPRAZolam (XANAX) 1 MG tablet; Take 1 tablet (1 mg total) by mouth 2 (two) times daily as needed for anxiety.  Dispense: 60 tablet; Refill: 0  4. External hemorrhoid Encouraged to take Metamucil to regulate bowel movements, prescribed topical hydrocortisone preparation to relieve pruritus, occult negative - pramoxine-hydrocortisone (PRAMOSONE) cream; Apply topically 3 (three) times daily.  Dispense: 57 g; Refill: 0  5. Right hip pain  - DG HIP UNILAT W OR W/O PELVIS 2-3 VIEWS RIGHT; Future  6. Need for immunization against influenza  - Flu Vaccine QUAD 36+ mos PF IM (Fluarix & Fluzone Quad PF)  Paschal Blanton [facility] 02/28/2016 10:52 AM

## 2016-03-06 ENCOUNTER — Ambulatory Visit: Payer: Self-pay | Admitting: Family Medicine

## 2016-03-14 ENCOUNTER — Encounter: Payer: Self-pay | Admitting: Anesthesiology

## 2016-03-14 ENCOUNTER — Ambulatory Visit: Payer: PRIVATE HEALTH INSURANCE | Attending: Anesthesiology | Admitting: Anesthesiology

## 2016-03-14 VITALS — BP 111/74 | HR 70 | Temp 97.9°F | Resp 18 | Ht 70.0 in | Wt 180.0 lb

## 2016-03-14 DIAGNOSIS — M533 Sacrococcygeal disorders, not elsewhere classified: Secondary | ICD-10-CM | POA: Diagnosis not present

## 2016-03-14 DIAGNOSIS — M5136 Other intervertebral disc degeneration, lumbar region: Secondary | ICD-10-CM | POA: Diagnosis not present

## 2016-03-14 DIAGNOSIS — M25551 Pain in right hip: Secondary | ICD-10-CM | POA: Diagnosis not present

## 2016-03-14 DIAGNOSIS — M4687 Other specified inflammatory spondylopathies, lumbosacral region: Secondary | ICD-10-CM | POA: Insufficient documentation

## 2016-03-14 DIAGNOSIS — M5431 Sciatica, right side: Secondary | ICD-10-CM | POA: Diagnosis not present

## 2016-03-14 DIAGNOSIS — Z79899 Other long term (current) drug therapy: Secondary | ICD-10-CM | POA: Insufficient documentation

## 2016-03-14 DIAGNOSIS — M545 Low back pain: Secondary | ICD-10-CM | POA: Diagnosis present

## 2016-03-14 MED ORDER — MIDAZOLAM HCL 5 MG/5ML IJ SOLN: 5.0000 mg | Freq: Once | INTRAMUSCULAR | Status: DC

## 2016-03-14 MED ORDER — IOPAMIDOL (ISOVUE-M 200) INJECTION 41%: 20.0000 mL | Freq: Once | INTRAMUSCULAR | Status: DC | PRN

## 2016-03-14 MED ORDER — LACTATED RINGERS IV SOLN: 1000.0000 mL | INTRAVENOUS | Status: DC

## 2016-03-14 MED ORDER — TRIAMCINOLONE ACETONIDE 40 MG/ML IJ SUSP: 40.0000 mg | Freq: Once | INTRAMUSCULAR | Status: DC

## 2016-03-14 MED ORDER — ROPIVACAINE HCL 2 MG/ML IJ SOLN: 10.0000 mL | Freq: Once | INTRAMUSCULAR | Status: DC

## 2016-03-14 NOTE — Progress Notes (Signed)
Safety precautions to be maintained throughout the outpatient stay will include: orient to surroundings, keep bed in low position, maintain call bell within reach at all times, provide assistance with transfer out of bed and ambulation.  

## 2016-03-14 NOTE — Patient Instructions (Signed)
Sacroiliac (SI) Joint Injection Patient Information  Description: The sacroiliac joint connects the scrum (very low back and tailbone) to the ilium (a pelvic bone which also forms half of the hip joint).  Normally this joint experiences very little motion.  When this joint becomes inflamed or unstable low back and or hip and pelvis pain may result.  Injection of this joint with local anesthetics (numbing medicines) and steroids can provide diagnostic information and reduce pain.  This injection is performed with the aid of x-ray guidance into the tailbone area while you are lying on your stomach.   You may experience an electrical sensation down the leg while this is being done.  You may also experience numbness.  We also may ask if we are reproducing your normal pain during the injection.  Conditions which may be treated SI injection:   Low back, buttock, hip or leg pain  Preparation for the Injection:  1. Do not eat any solid food or dairy products within 8 hours of your appointment.  2. You may drink clear liquids up to 3 hours before appointment.  Clear liquids include water, black coffee, juice or soda.  No milk or cream please. 3. You may take your regular medications, including pain medications with a sip of water before your appointment.  Diabetics should hold regular insulin (if take separately) and take 1/2 normal NPH dose the morning of the procedure.  Carry some sugar containing items with you to your appointment. 4. A driver must accompany you and be prepared to drive you home after your procedure. 5. Bring all of your current medications with you. 6. An IV may be inserted and sedation may be given at the discretion of the physician. 7. A blood pressure cuff, EKG and other monitors will often be applied during the procedure.  Some patients may need to have extra oxygen administered for a short period.  8. You will be asked to provide medical information, including your allergies,  prior to the procedure.  We must know immediately if you are taking blood thinners (like Coumadin/Warfarin) or if you are allergic to IV iodine contrast (dye).  We must know if you could possible be pregnant.  Possible side effects:   Bleeding from needle site  Infection (rare, may require surgery)  Nerve injury (rare)  Numbness & tingling (temporary)  A brief convulsion or seizure  Light-headedness (temporary)  Pain at injection site (several days)  Decreased blood pressure (temporary)  Weakness in the leg (temporary)   Call if you experience:   New onset weakness or numbness of an extremity below the injection site that last more than 8 hours.  Hives or difficulty breathing ( go to the emergency room)  Inflammation or drainage at the injection site  Any new symptoms which are concerning to you  Please note:  Although the local anesthetic injected can often make your back/ hip/ buttock/ leg feel good for several hours after the injections, the pain will likely return.  It takes 3-7 days for steroids to work in the sacroiliac area.  You may not notice any pain relief for at least that one week.  If effective, we will often do a series of three injections spaced 3-6 weeks apart to maximally decrease your pain.  After the initial series, we generally will wait some months before a repeat injection of the same type.  If you have any questions, please call [phone] [facility]  Pain Management Discharge  Instructions  General Discharge Instructions :  If you need to reach your doctor call: Monday-Friday 8:00 am - 4:00 pm at [phone] or toll free [phone].  After clinic hours [phone] to have operator reach doctor.  Bring all of your medication bottles to all your appointments in the pain clinic.  To cancel or reschedule your appointment with Pain Management please remember to call 24 hours in advance to avoid a  fee.  Refer to the educational materials which you have been given on: General Risks, I had my Procedure. Discharge Instructions, Post Sedation.  Post Procedure Instructions:  The drugs you were given will stay in your system until tomorrow, so for the next 24 hours you should not drive, make any legal decisions or drink any alcoholic beverages.  You may eat anything you prefer, but it is better to start with liquids then soups and crackers, and gradually work up to solid foods.  Please notify your doctor immediately if you have any unusual bleeding, trouble breathing or pain that is not related to your normal pain.  Depending on the type of procedure that was done, some parts of your body may feel week and/or numb.  This usually clears up by tonight or the next day.  Walk with the use of an assistive device or accompanied by an adult for the 24 hours.  You may use ice on the affected area for the first 24 hours.  Put ice in a Ziploc bag and cover with a towel and place against area 15 minutes on 15 minutes off.  You may switch to heat after 24 hours.GENERAL RISKS AND COMPLICATIONS  What are the risk, side effects and possible complications? Generally speaking, most procedures are safe.  However, with any procedure there are risks, side effects, and the possibility of complications.  The risks and complications are dependent upon the sites that are lesioned, or the type of nerve block to be performed.  The closer the procedure is to the spine, the more serious the risks are.  Great care is taken when placing the radio frequency needles, block needles or lesioning probes, but sometimes complications can occur. 1. Infection: Any time there is an injection through the skin, there is a risk of infection.  This is why sterile conditions are used for these blocks.  There are four possible types of infection. 1. Localized skin infection. 2. Central Nervous System Infection-This can be in the form of  Meningitis, which can be deadly. 3. Epidural Infections-This can be in the form of an epidural abscess, which can cause pressure inside of the spine, causing compression of the spinal cord with subsequent paralysis. This would require an emergency surgery to decompress, and there are no guarantees that the patient would recover from the paralysis. 4. Discitis-This is an infection of the intervertebral discs.  It occurs in about 1% of discography procedures.  It is difficult to treat and it may lead to surgery.        2. Pain: the needles have to go through skin and soft tissues, will cause soreness.       3. Damage to internal structures:  The nerves to be lesioned may be near blood vessels or    other nerves which can be potentially damaged.       4. Bleeding: Bleeding is more common if the patient is taking blood thinners such as  aspirin, Coumadin, Ticiid, Plavix, etc., or if he/she have some genetic predisposition  such as hemophilia. Bleeding  into the spinal canal can cause compression of the spinal  cord with subsequent paralysis.  This would require an emergency surgery to  decompress and there are no guarantees that the patient would recover from the  paralysis.       5. Pneumothorax:  Puncturing of a lung is a possibility, every time a needle is introduced in  the area of the chest or upper back.  Pneumothorax refers to free air around the  collapsed lung(s), inside of the thoracic cavity (chest cavity).  Another two possible  complications related to a similar event would include: Hemothorax and Chylothorax.   These are variations of the Pneumothorax, where instead of air around the collapsed  lung(s), you may have blood or chyle, respectively.       6. Spinal headaches: They may occur with any procedures in the area of the spine.       7. Persistent CSF (Cerebro-Spinal Fluid) leakage: This is a rare problem, but may occur  with prolonged intrathecal or epidural catheters either due to the  formation of a fistulous  track or a dural tear.       8. Nerve damage: By working so close to the spinal cord, there is always a possibility of  nerve damage, which could be as serious as a permanent spinal cord injury with  paralysis.       9. Death:  Although rare, severe deadly allergic reactions known as "Anaphylactic  reaction" can occur to any of the medications used.      10. Worsening of the symptoms:  We can always make thing worse.  What are the chances of something like this happening? Chances of any of this occuring are extremely low.  By statistics, you have more of a chance of getting killed in a motor vehicle accident: while driving to the hospital than any of the above occurring .  Nevertheless, you should be aware that they are possibilities.  In general, it is similar to taking a shower.  Everybody knows that you can slip, hit your head and get killed.  Does that mean that you should not shower again?  Nevertheless always keep in mind that statistics do not mean anything if you happen to be on the wrong side of them.  Even if a procedure has a 1 (one) in a 1,000,000 (million) chance of going wrong, it you happen to be that one..Also, keep in mind that by statistics, you have more of a chance of having something go wrong when taking medications.  Who should not have this procedure? If you are on a blood thinning medication (e.g. Coumadin, Plavix, see list of "Blood Thinners"), or if you have an active infection going on, you should not have the procedure.  If you are taking any blood thinners, please inform your physician.  How should I prepare for this procedure?  Do not eat or drink anything at least six hours prior to the procedure.  Bring a driver with you .  It cannot be a taxi.  Come accompanied by an adult that can drive you back, and that is strong enough to help you if your legs get weak or numb from the local anesthetic.  Take all of your medicines the morning of the  procedure with just enough water to swallow them.  If you have diabetes, make sure that you are scheduled to have your procedure done first thing in the morning, whenever possible.  If you have diabetes, take only  half of your insulin dose and notify our nurse that you have done so as soon as you arrive at the clinic.  If you are diabetic, but only take blood sugar pills (oral hypoglycemic), then do not take them on the morning of your procedure.  You may take them after you have had the procedure.  Do not take aspirin or any aspirin-containing medications, at least eleven (11) days prior to the procedure.  They may prolong bleeding.  Wear loose fitting clothing that may be easy to take off and that you would not mind if it got stained with Betadine or blood.  Do not wear any jewelry or perfume  Remove any nail coloring.  It will interfere with some of our monitoring equipment.  NOTE: Remember that this is not meant to be interpreted as a complete list of all possible complications.  Unforeseen problems may occur.  BLOOD THINNERS The following drugs contain aspirin or other products, which can cause increased bleeding during surgery and should not be taken for 2 weeks prior to and 1 week after surgery.  If you should need take something for relief of minor pain, you may take acetaminophen which is found in Tylenol,m Datril, Anacin-3 and Panadol. It is not blood thinner. The products listed below are.  Do not take any of the products listed below in addition to any listed on your instruction sheet.  A.P.C or A.P.C with Codeine Codeine Phosphate Capsules #3 Ibuprofen Ridaura  ABC compound Congesprin Imuran rimadil  Advil Cope Indocin Robaxisal  Alka-Seltzer Effervescent Pain Reliever and Antacid Coricidin or Coricidin-D  Indomethacin Rufen  Alka-Seltzer plus Cold Medicine Cosprin Ketoprofen S-A-C Tablets  Anacin Analgesic Tablets or Capsules Coumadin Korlgesic Salflex  Anacin Extra Strength  Analgesic tablets or capsules CP-2 Tablets Lanoril Salicylate  Anaprox Cuprimine Capsules Levenox Salocol  Anexsia-D Dalteparin Magan Salsalate  Anodynos Darvon compound Magnesium Salicylate Sine-off  Ansaid Dasin Capsules Magsal Sodium Salicylate  Anturane Depen Capsules Marnal Soma  APF Arthritis pain formula Dewitt's Pills Measurin Stanback  Argesic Dia-Gesic Meclofenamic Sulfinpyrazone  Arthritis Bayer Timed Release Aspirin Diclofenac Meclomen Sulindac  Arthritis pain formula Anacin Dicumarol Medipren Supac  Analgesic (Safety coated) Arthralgen Diffunasal Mefanamic Suprofen  Arthritis Strength Bufferin Dihydrocodeine Mepro Compound Suprol  Arthropan liquid Dopirydamole Methcarbomol with Aspirin Synalgos  ASA tablets/Enseals Disalcid Micrainin Tagament  Ascriptin Doan's Midol Talwin  Ascriptin A/D Dolene Mobidin Tanderil  Ascriptin Extra Strength Dolobid Moblgesic Ticlid  Ascriptin with Codeine Doloprin or Doloprin with Codeine Momentum Tolectin  Asperbuf Duoprin Mono-gesic Trendar  Aspergum Duradyne Motrin or Motrin IB Triminicin  Aspirin plain, buffered or enteric coated Durasal Myochrisine Trigesic  Aspirin Suppositories Easprin Nalfon Trillsate  Aspirin with Codeine Ecotrin Regular or Extra Strength Naprosyn Uracel  Atromid-S Efficin Naproxen Ursinus  Auranofin Capsules Elmiron Neocylate Vanquish  Axotal Emagrin Norgesic Verin  Azathioprine Empirin or Empirin with Codeine Normiflo Vitamin E  Azolid Emprazil Nuprin Voltaren  Bayer Aspirin plain, buffered or children's or timed BC Tablets or powders Encaprin Orgaran Warfarin Sodium  Buff-a-Comp Enoxaparin Orudis Zorpin  Buff-a-Comp with Codeine Equegesic Os-Cal-Gesic   Buffaprin Excedrin plain, buffered or Extra Strength Oxalid   Bufferin Arthritis Strength Feldene Oxphenbutazone   Bufferin plain or Extra Strength Feldene Capsules Oxycodone with Aspirin   Bufferin with Codeine Fenoprofen Fenoprofen Pabalate or Pabalate-SF    Buffets II Flogesic Panagesic   Buffinol plain or Extra Strength Florinal or Florinal with Codeine Panwarfarin   Buf-Tabs Flurbiprofen Penicillamine   Butalbital Compound Four-way cold tablets Penicillin  Butazolidin Fragmin Pepto-Bismol   Carbenicillin Geminisyn Percodan   Carna Arthritis Reliever Geopen Persantine   Carprofen Gold's salt Persistin   Chloramphenicol Goody's Phenylbutazone   Chloromycetin Haltrain Piroxlcam   Clmetidine heparin Plaquenil   Cllnoril Hyco-pap Ponstel   Clofibrate Hydroxy chloroquine Propoxyphen         Before stopping any of these medications, be sure to consult the physician who ordered them.  Some, such as Coumadin (Warfarin) are ordered to prevent or treat serious conditions such as "deep thrombosis", "pumonary embolisms", and other heart problems.  The amount of time that you may need off of the medication may also vary with the medication and the reason for which you were taking it.  If you are taking any of these medications, please make sure you notify your pain physician before you undergo any procedures.

## 2016-03-15 NOTE — Progress Notes (Signed)
Subjective:  Patient ID: Kiara Le, female    DOB: 09-09-1975  Age: 40 y.o. MRN: [id number]  CC: Back Pain (low)   Service Provided on Last Visit: Procedure (lesi)  Procedure: None Previous PROCEDURE:L5-S1 epidural steroid No. 1 under fluoroscopic guidance with moderate sedation  HPI [name] presents for a re- evaluation. She was last seen approximately a month ago for her low back pain and radiating pain affecting the right thigh. She states that she failed to gain any significant improvement in her thigh pain and continues to have low back pain following the injection. Shortly after the injection she had some bilateral calf cramping that did resolve spontaneously. Otherwise the quality characteristic and distribution of her pain are unchanged. She notes that the pain she is experiencing primarily stays in the right posterior low back and hip region and recently she's had some more internalized right hip pain. She continues to have right posterior lateral leg pain  She is a pleasant 40 year old white female with a long-standing history of low back pain. She reports a pain that is primarily centralized in the low back that is worse with extension sitting standing squatting and walking for prolonged periods of time. Bending and climbing also seemed to aggravate her pain. Cold  packs and medication management in addition to warm showers have helped alleviate pain. This primarily stays centralized in the low back with radiation into the right lateral hip and posterior leg with no associated problems with bowel or bladder dysfunction. She has no tingling affecting the lower legs but does have bilateral knee pain. The pain occasionally radiates into the posterior right thigh it is sharp in nature and she is status post an MRI showing evidence of some facet genic disease And mild degenerative disc disease at L5-S1. She furthermore states that her strength is been well preserved other than the  chronic pain in the right lower back  History Cyd has a past medical history of Anxiety; Asthma; Bipolar disorder ([place]); Chronic fatigue; Chronic low back pain; Diabetes mellitus without complication ([place]); Fatty liver disease, nonalcoholic; History of Helicobacter pylori infection; Hyperlipidemia; Hypertension; Insomnia; Morbid obesity ([place]); Panic disorder; PTSD (post-traumatic stress disorder); Vitamin B12 deficiency; and Vitamin D deficiency.   She has a past surgical history that includes Tubal ligation; Partial hysterectomy; Gastroplasty duodenal switch; Salpingectomy; and Abdominal hysterectomy.   Her family history includes Anxiety disorder in her sister; Depression in her sister; Drug abuse in her mother; Heart disease in her father; Hyperlipidemia in her father; Hypertension in her father.She reports that she has never smoked. She has never used smokeless tobacco. She reports that she does not drink alcohol or use drugs.            [facility]ox[facility] Select 13  Date Value Ref Range Status  12/18/2015 FINAL  Final    Comment:    ==================================================================== TOX[facility] SELECT 13 (MW) ==================================================================== Test                             Result       Flag       Units Drug Present and Declared for Prescription Verification   Alprazolam                     94           EXPECTED   ng/mg creat   Alpha-hydroxyalprazolam        94  EXPECTED   ng/mg creat    Source of alprazolam is a scheduled prescription medication.    Alpha-hydroxyalprazolam is an expected metabolite of alprazolam.   Oxycodone                      321          EXPECTED   ng/mg creat   Oxymorphone                    297          EXPECTED   ng/mg creat   Noroxycodone                   435          EXPECTED   ng/mg creat   Noroxymorphone                 121          EXPECTED   ng/mg creat    Sources of oxycodone are scheduled  prescription medications.    Oxymorphone, noroxycodone, and noroxymorphone are expected    metabolites of oxycodone. Oxymorphone is also available as a    scheduled prescription medication. ==================================================================== Test                      Result    Flag   Units      Ref Range   Creatinine              154              mg/dL      >=20 ==================================================================== Declared Medications:  The flagging and interpretation on this report are based on the  following declared medications.  Unexpected results may arise from  inaccuracies in the declared medications.  **Note: The testing scope of this panel includes these medications:  Alprazolam (Xanax)  Oxycodone (Percocet)  **Note: The testing scope of this panel does not include following  reported medications:  Acetaminophen (Percocet)  Diclofenac (Voltaren)  Multivitamin  Pantoprazole (Protonix) ==================================================================== For clinical consultation, please call [phone]. ====================================================================     Outpatient Medications Prior to Visit  Medication Sig Dispense Refill  . ALPRAZolam (XANAX) 1 MG tablet Take 1 tablet (1 mg total) by mouth 2 (two) times daily as needed for anxiety. 60 tablet 0  . diclofenac sodium (VOLTAREN) 1 % GEL Apply 4 g topically 4 (four) times daily. 100 g 0  . glucose blood (ACCU-CHEK AVIVA PLUS) test strip 1 strip by Does not apply route daily.    . Multiple Vitamin (MULTI-VITAMINS) TABS Take by mouth. Reported on 12/18/2015    . oxyCODONE-acetaminophen (PERCOCET) 10-325 MG tablet Take 1 tablet by mouth 2 (two) times daily as needed for pain. 60 tablet 0  . pantoprazole (PROTONIX) 40 MG tablet Take 1 tablet (40 mg total) by mouth 2 (two) times daily. 180 tablet 0  . pramoxine-hydrocortisone (PRAMOSONE) cream Apply topically 3 (three) times  daily. 57 g 0   Facility-Administered Medications Prior to Visit  Medication Dose Route Frequency Provider Last Rate Last Dose  . fentaNYL (SUBLIMAZE) injection 100 mcg  100 mcg Intravenous Once Molli Barrows, MD      . iopamidol (ISOVUE-M) 41 % intrathecal injection 20 mL  20 mL Other Once PRN Molli Barrows, MD      . lactated ringers infusion 1,000 mL  1,000 mL Intravenous Continuous Molli Barrows, MD      .  midazolam (VERSED) injection 5 mg  5 mg Intravenous Once Molli Barrows, MD       Lab Results  Component Value Date   WBC 5.2 09/22/2014   HGB 13.5 09/22/2014   HCT 40 09/22/2014   PLT 210 09/22/2014   GLUCOSE 86 12/27/2014   CHOL 118 09/22/2014   TRIG 64 09/22/2014   HDL 49 09/22/2014   LDLCALC 56 09/22/2014   ALT 26 12/27/2014   AST 17 12/27/2014   NA 141 12/27/2014   K 4.6 12/27/2014   CL 101 12/27/2014   CREATININE 0.68 12/27/2014   BUN 9 12/27/2014   CO2 24 12/27/2014   TSH 1.76 09/22/2014   HGBA1C 4.6 11/30/2015    --------------------------------------------------------------------------------------------------------------------- Mm Digital Screening Bilateral  02/14/2015  CLINICAL DATA:  Screening. EXAM: DIGITAL SCREENING BILATERAL MAMMOGRAM WITH CAD COMPARISON:  None. ACR Breast Density Category c: The breast tissue is heterogeneously dense, which may obscure small masses FINDINGS: There are no findings suspicious for malignancy. Images were processed with CAD. IMPRESSION: No mammographic evidence of malignancy. A result letter of this screening mammogram will be mailed directly to the patient. RECOMMENDATION: Screening mammogram at age 47. (Code:SM-B-40A) BI-RADS CATEGORY  1: Negative. Electronically Signed   By: Everlean Alstrom M.D.   On: 02/14/2015 09:02       ---------------------------------------------------------------------------------------------------------------------- Past Medical History:  Diagnosis Date  . Anxiety   . Asthma   . Bipolar  disorder ([place])   . Chronic fatigue   . Chronic low back pain   . Diabetes mellitus without complication ([place])   . Fatty liver disease, nonalcoholic   . History of Helicobacter pylori infection   . Hyperlipidemia   . Hypertension   . Insomnia   . Morbid obesity ([place])   . Panic disorder   . PTSD (post-traumatic stress disorder)   . Vitamin B12 deficiency   . Vitamin D deficiency     Past Surgical History:  Procedure Laterality Date  . ABDOMINAL HYSTERECTOMY    . GASTROPLASTY DUODENAL SWITCH    . PARTIAL HYSTERECTOMY    . SALPINGECTOMY    . TUBAL LIGATION      Family History  Problem Relation Age of Onset  . Heart disease Father   . Hypertension Father   . Hyperlipidemia Father   . Drug abuse Mother   . Anxiety disorder Sister   . Depression Sister     Social History  Substance Use Topics  . Smoking status: Never Smoker  . Smokeless tobacco: Never Used  . Alcohol use No    ---------------------------------------------------------------------------------------------------------------------- Social History   Social History  . Marital status: Married    Spouse name: Michiel Cowboy  . Number of children: 2  . Years of education: N/A   Occupational History  . Customer Service    Social History Main Topics  . Smoking status: Never Smoker  . Smokeless tobacco: Never Used  . Alcohol use No  . Drug use: No  . Sexual activity: Yes    Partners: Male    Birth control/ protection: None   Other Topics Concern  . None   Social History Narrative  . None    Scheduled Meds: Continuous Infusions: PRN Meds:.   BP 111/74   Pulse 70   Temp 97.9 F (36.6 C) (Oral)   Resp 18   Ht 5\' 10"  (1.778 m)   Wt 180 lb (81.6 kg)   SpO2 100%   BMI 25.83 kg/m    BP Readings from Last 3 Encounters:  03/14/16 111/74  02/28/16 114/73  01/30/16 112/63     Wt Readings from Last 3 Encounters:  03/14/16 180 lb (81.6 kg)  02/28/16 183 lb 8 oz (83.2 kg)  01/30/16 180 lb 4.8 oz  (81.8 kg)     ----------------------------------------------------------------------------------------------------------------------  ROS Review of Systems    Objective:  BP 111/74   Pulse 70   Temp 97.9 F (36.6 C) (Oral)   Resp 18   Ht 5\' 10"  (1.778 m)   Wt 180 lb (81.6 kg)   SpO2 100%   BMI 25.83 kg/m   Physical Exam Patient is a pleasant 40 year old white female in no acute distress and she is alert and oriented 3 cooperative and compliant Pupils are equally round reactive to light extraocular muscles are intact Heart is regular rate and rhythm without murmur Lungs are clear to also auscultation Inspection low back reveals some paraspinous muscle tenderness centralized in the lower back that is exacerbated by extension at the low back with right lateral rotation. This causes pain radiating into the right hip and buttocks. With the patient supine compression over the right anterior superior iliac crest causes reproduction of right-sided low back pain consistent with her primary pain complaint  Assessment & Plan:   Asuzena was seen today for back pain.  Diagnoses and all orders for this visit:  Sacroiliac joint dysfunction of right side -     Cancel: SACROILIAC JOINT INJECTINS -     triamcinolone acetonide (KENALOG-40) injection 40 mg; 1 mL (40 mg total) by Other route once. -     ropivacaine (PF) 2 mg/ml (0.2%) (NAROPIN) epidural 10 mL; 10 mLs by Epidural route once. -     midazolam (VERSED) 5 MG/5ML injection 5 mg; Inject 5 mLs (5 mg total) into the vein once. -     lactated ringers infusion 1,000 mL; Inject 1,000 mLs into the vein continuous. -     iopamidol (ISOVUE-M) 41 % intrathecal injection 20 mL; 20 mLs by Other route once as needed for contrast. -     SACROILIAC JOINT INJECTINS; Future     ----------------------------------------------------------------------------------------------------------------------  Problem List Items Addressed This Visit    None     Visit Diagnoses    Sacroiliac joint dysfunction of right side    -  Primary   Relevant Medications   triamcinolone acetonide (KENALOG-40) injection 40 mg   ropivacaine (PF) 2 mg/ml (0.2%) (NAROPIN) epidural 10 mL   midazolam (VERSED) 5 MG/5ML injection 5 mg   lactated ringers infusion 1,000 mL   iopamidol (ISOVUE-M) 41 % intrathecal injection 20 mL   Other Relevant Orders   SACROILIAC JOINT INJECTINS      ----------------------------------------------------------------------------------------------------------------------  1.Sacroiliac joint dysfunction right side. I'm going to schedule her for a right side diagnostic SI injection.  2.DDD  Sciatica associated with disorder of lumbar spine, right  -  3. Hip pain, chronic, right  Continue follow-up with her orthopedic physician regarding evaluation for intrinsic right hip pain  4. Facet arthritis of lumbosacral region Ultimately she may be a candidate for a diagnostic right side facet block. However at this point her symptoms appear most consistent with SI dysfunction. This is furthermore cooperated by her physical exam complex.   ----------------------------------------------------------------------------------------------------------------------  I am having Ms. Falconer maintain her glucose blood, pantoprazole, MULTI-VITAMINS, diclofenac sodium, oxyCODONE-acetaminophen, ALPRAZolam, and pramoxine-hydrocortisone. We will continue to administer midazolam, iopamidol, lactated ringers, fentaNYL, triamcinolone acetonide, ropivacaine (PF) 2 mg/ml (0.2%), midazolam, lactated ringers, and iopamidol.   Meds ordered this encounter  Medications  . triamcinolone acetonide (KENALOG-40) injection 40 mg  . ropivacaine (PF) 2 mg/ml (0.2%) (NAROPIN) epidural 10 mL  . midazolam (VERSED) 5 MG/5ML injection 5 mg  . lactated ringers infusion 1,000 mL  . iopamidol (ISOVUE-M) 41 % intrathecal injection 20 mL    Procedure: We were planning on  proceeding with an injection today however will need to get insurance verification prior to advancing this. She is to return to clinic in a month for reevaluation and possible right SI injection at that time. In the meantime continue with current medication management and stretching strengthening physical therapy exercises.   Follow-up: Return in about 1 month (around 04/14/2016) for evaluation, procedure.    Molli Barrows, MD  This dictation was performed utilizing Dragon voice recognition software.  Please excuse any unintentional or mistaken typographical errors as a result of its unedited utilization.

## 2016-03-27 ENCOUNTER — Ambulatory Visit
Admission: RE | Admit: 2016-03-27 | Discharge: 2016-03-27 | Disposition: A | Discharge status: Home / Self Care | Payer: PRIVATE HEALTH INSURANCE | Source: Ambulatory Visit | Attending: Family Medicine | Admitting: Family Medicine

## 2016-03-27 DIAGNOSIS — M25551 Pain in right hip: Secondary | ICD-10-CM | POA: Insufficient documentation

## 2016-04-03 ENCOUNTER — Encounter: Payer: Self-pay | Admitting: Family Medicine

## 2016-04-03 ENCOUNTER — Ambulatory Visit (INDEPENDENT_AMBULATORY_CARE_PROVIDER_SITE_OTHER): Payer: PRIVATE HEALTH INSURANCE | Admitting: Family Medicine

## 2016-04-03 VITALS — BP 113/77 | HR 98 | Temp 99.2°F | Resp 17 | Ht 70.0 in | Wt 183.1 lb

## 2016-04-03 DIAGNOSIS — G8929 Other chronic pain: Secondary | ICD-10-CM | POA: Diagnosis not present

## 2016-04-03 DIAGNOSIS — T753XXD Motion sickness, subsequent encounter: Secondary | ICD-10-CM

## 2016-04-03 DIAGNOSIS — M545 Low back pain: Secondary | ICD-10-CM | POA: Diagnosis not present

## 2016-04-03 DIAGNOSIS — F4001 Agoraphobia with panic disorder: Secondary | ICD-10-CM

## 2016-04-03 MED ORDER — OXYCODONE-ACETAMINOPHEN 10-325 MG PO TABS: 1.0000 | ORAL_TABLET | Freq: Two times a day (BID) | ORAL | 0 refills | Status: DC | PRN

## 2016-04-03 MED ORDER — SCOPOLAMINE 1 MG/3DAYS TD PT72: 1.0000 | MEDICATED_PATCH | TRANSDERMAL | 0 refills | Status: DC

## 2016-04-03 MED ORDER — TIZANIDINE HCL 2 MG PO TABS: 2.0000 mg | ORAL_TABLET | Freq: Three times a day (TID) | ORAL | 0 refills | Status: DC | PRN

## 2016-04-03 MED ORDER — ALPRAZOLAM 1 MG PO TABS: 1.0000 mg | ORAL_TABLET | Freq: Two times a day (BID) | ORAL | 0 refills | Status: DC | PRN

## 2016-04-03 NOTE — Progress Notes (Signed)
Name: Kiara Le   MRN: [id number]    DOB: 24-Sep-1975   Date:04/03/2016       Progress Note  Subjective  Chief Complaint  Chief Complaint  Patient presents with  . Follow-up    1 mo  . Medication Refill    Anxiety  Presents for follow-up visit. Symptoms include depressed mood, excessive worry, nervous/anxious behavior, panic and shortness of breath (with panic attacks.). The severity of symptoms is moderate. The quality of sleep is good.    Back Pain  This is a chronic problem. The pain is present in the lumbar spine and gluteal. The pain radiates to the right thigh (right groin). The pain is at a severity of 4/10. Pertinent negatives include no leg pain, numbness, paresis or tingling. She has tried analgesics for the symptoms.   Sea Sickness: Pt. is leaving for a cruise next week from [place] to [place], asking for scopolamine patches to relieve sea sickness. She has used the patches before and it helps.  Past Medical History:  Diagnosis Date  . Anxiety   . Asthma   . Bipolar disorder ([place])   . Chronic fatigue   . Chronic low back pain   . Diabetes mellitus without complication ([place])   . Fatty liver disease, nonalcoholic   . History of Helicobacter pylori infection   . Hyperlipidemia   . Hypertension   . Insomnia   . Morbid obesity ([place])   . Panic disorder   . PTSD (post-traumatic stress disorder)   . Vitamin B12 deficiency   . Vitamin D deficiency     Past Surgical History:  Procedure Laterality Date  . ABDOMINAL HYSTERECTOMY    . GASTROPLASTY DUODENAL SWITCH    . PARTIAL HYSTERECTOMY    . SALPINGECTOMY    . TUBAL LIGATION      Family History  Problem Relation Age of Onset  . Heart disease Father   . Hypertension Father   . Hyperlipidemia Father   . Drug abuse Mother   . Anxiety disorder Sister   . Depression Sister     Social History   Social History  . Marital status: Married    Spouse name: Michiel Cowboy  . Number of children: 2  . Years of education:  N/A   Occupational History  . Customer Service    Social History Main Topics  . Smoking status: Never Smoker  . Smokeless tobacco: Never Used  . Alcohol use No  . Drug use: No  . Sexual activity: Yes    Partners: Male    Birth control/ protection: None   Other Topics Concern  . Not on file   Social History Narrative  . No narrative on file     Current Outpatient Prescriptions:  .  ALPRAZolam (XANAX) 1 MG tablet, Take 1 tablet (1 mg total) by mouth 2 (two) times daily as needed for anxiety., Disp: 60 tablet, Rfl: 0 .  diclofenac sodium (VOLTAREN) 1 % GEL, Apply 4 g topically 4 (four) times daily., Disp: 100 g, Rfl: 0 .  glucose blood (ACCU-CHEK AVIVA PLUS) test strip, 1 strip by Does not apply route daily., Disp: , Rfl:  .  Multiple Vitamin (MULTI-VITAMINS) TABS, Take by mouth. Reported on 12/18/2015, Disp: , Rfl:  .  oxyCODONE-acetaminophen (PERCOCET) 10-325 MG tablet, Take 1 tablet by mouth 2 (two) times daily as needed for pain., Disp: 60 tablet, Rfl: 0 .  pantoprazole (PROTONIX) 40 MG tablet, Take 1 tablet (40 mg total) by mouth 2 (two) times  daily., Disp: 180 tablet, Rfl: 0 .  pramoxine-hydrocortisone (PRAMOSONE) cream, Apply topically 3 (three) times daily., Disp: 57 g, Rfl: 0  Current Facility-Administered Medications:  .  fentaNYL (SUBLIMAZE) injection 100 mcg, 100 mcg, Intravenous, Once, Molli Barrows, MD .  iopamidol (ISOVUE-M) 41 % intrathecal injection 20 mL, 20 mL, Other, Once PRN, Molli Barrows, MD .  iopamidol (ISOVUE-M) 41 % intrathecal injection 20 mL, 20 mL, Other, Once PRN, Molli Barrows, MD .  lactated ringers infusion 1,000 mL, 1,000 mL, Intravenous, Continuous, Molli Barrows, MD .  lactated ringers infusion 1,000 mL, 1,000 mL, Intravenous, Continuous, Molli Barrows, MD .  midazolam (VERSED) 5 MG/5ML injection 5 mg, 5 mg, Intravenous, Once, Molli Barrows, MD .  midazolam (VERSED) injection 5 mg, 5 mg, Intravenous, Once, Molli Barrows, MD .  ropivacaine (PF) 2  mg/ml (0.2%) (NAROPIN) epidural 10 mL, 10 mL, Epidural, Once, Molli Barrows, MD .  triamcinolone acetonide (KENALOG-40) injection 40 mg, 40 mg, Other, Once, Molli Barrows, MD  Allergies  Allergen Reactions  . 2,4-D Dimethylamine (Amisol) Swelling  . Bee Venom Swelling  . Nsaids Other (See Comments)    Cannot take due to abd surgery  . Other Hives and Other (See Comments)    Cannot take due to abd surgery Cannot take due to abd surgery  . Tetanus Toxoid Adsorbed Other (See Comments)    Swelling, hot  . Tetanus Toxoids     Swelling, hot     Review of Systems  Respiratory: Positive for shortness of breath (with panic attacks.).   Musculoskeletal: Positive for back pain.  Neurological: Negative for tingling and numbness.  Psychiatric/Behavioral: Positive for depression. The patient is nervous/anxious.      Objective  Vitals:   04/03/16 0809  BP: 113/77  Pulse: 98  Resp: 17  Temp: 99.2 F (37.3 C)  TempSrc: Oral  SpO2: 98%  Weight: 183 lb 1.6 oz (83.1 kg)  Height: 5\' 10"  (1.778 m)    Physical Exam  Constitutional: She is oriented to person, place, and time and well-developed, well-nourished, and in no distress.  HENT:  Head: Normocephalic and atraumatic.  Cardiovascular: Normal rate, regular rhythm and normal heart sounds.   No murmur heard. Pulmonary/Chest: Effort normal and breath sounds normal. She has no wheezes.  Musculoskeletal:       Right hip: She exhibits tenderness.       Lumbar back: She exhibits tenderness and pain.       Back:       Legs: Neurological: She is alert and oriented to person, place, and time.  Psychiatric: Mood, memory, affect and judgment normal.  Nursing note and vitals reviewed.    Assessment & Plan  1. Chronic right-sided low back pain without sciatica Started on tizanidine for right gluteal and groin muscle stiffness. Refills for Percocet provided - oxyCODONE-acetaminophen (PERCOCET) 10-325 MG tablet; Take 1 tablet by mouth 2  (two) times daily as needed for pain.  Dispense: 60 tablet; Refill: 0 - tiZANidine (ZANAFLEX) 2 MG tablet; Take 1 tablet (2 mg total) by mouth every 8 (eight) hours as needed for muscle spasms.  Dispense: 90 tablet; Refill: 0  2. Panic disorder with agoraphobia and moderate panic attacks Stable, responsive to alprazolam 1 mg twice a day when necessary, patient compliant with controlled substances agreement and understands the dependence potential, side effects and drug interactions of benzodiazepines. Refills provided and follow-up in one month - ALPRAZolam (XANAX) 1 MG tablet; Take 1  tablet (1 mg total) by mouth 2 (two) times daily as needed for anxiety.  Dispense: 60 tablet; Refill: 0  3. Sea sickness, subsequent encounter  - scopolamine (TRANSDERM-SCOP, 1.5 MG,) 1 MG/3DAYS; Place 1 patch (1.5 mg total) onto the skin every 3 (three) days.  Dispense: 10 patch; Refill: 0  Wilhemina Grall Asad A[facility] 04/03/2016 8:26 AM

## 2016-04-22 ENCOUNTER — Ambulatory Visit
Admission: EM | Admit: 2016-04-22 | Discharge: 2016-04-22 | Disposition: A | Discharge status: Home / Self Care | Payer: PRIVATE HEALTH INSURANCE | Attending: Family Medicine | Admitting: Family Medicine

## 2016-04-22 DIAGNOSIS — W57XXXA Bitten or stung by nonvenomous insect and other nonvenomous arthropods, initial encounter: Secondary | ICD-10-CM | POA: Diagnosis not present

## 2016-04-22 DIAGNOSIS — L03115 Cellulitis of right lower limb: Secondary | ICD-10-CM | POA: Diagnosis not present

## 2016-04-22 MED ORDER — AMOXICILLIN-POT CLAVULANATE 875-125 MG PO TABS: 1.0000 | ORAL_TABLET | Freq: Two times a day (BID) | ORAL | 0 refills | Status: DC

## 2016-04-22 NOTE — ED Provider Notes (Signed)
[facility]    CSN: [id number] Arrival date & time: 04/22/16  1726     History   Chief Complaint Chief Complaint  Patient presents with  . Insect Bite    HPI Kiara Le is a 40 y.o. female.   40 yo female with a c/o bitten by an unknown insect in [place] last Thursday. She says initially felt a burning sensation and then it blistered. Since then area of skin around it has turned red and is tender. Denies any fevers, chills or drainage.    The history is provided by the patient.    Past Medical History:  Diagnosis Date  . Anxiety   . Asthma   . Bipolar disorder ([place])   . Chronic fatigue   . Chronic low back pain   . Diabetes mellitus without complication ([place])   . Fatty liver disease, nonalcoholic   . History of Helicobacter pylori infection   . Hyperlipidemia   . Hypertension   . Insomnia   . Morbid obesity ([place])   . Panic disorder   . PTSD (post-traumatic stress disorder)   . Vitamin B12 deficiency   . Vitamin D deficiency     Patient Active Problem List   Diagnosis Date Noted  . Vitamin B12 deficiency 11/30/2015  . History of diabetes mellitus, type II 11/30/2015  . Enteritis 07/06/2015  . Vitamin D insufficiency 06/06/2015  . Arthritis of right knee 01/24/2015  . Motion sickness 01/24/2015  . Anxiety disorder 11/24/2014  . Anxiety and depression 11/24/2014  . Arthralgia of lower leg 11/24/2014  . Back pain with radiation 11/24/2014  . Continuous opioid dependence ([place]) 11/24/2014  . GERD (gastroesophageal reflux disease) 11/24/2014  . Chronic low back pain without sciatica 11/24/2014  . Panic disorder with agoraphobia and moderate panic attacks 11/24/2014  . H/O surgical procedure 04/18/2014  . Chest discomfort 05/05/2013    Past Surgical History:  Procedure Laterality Date  . ABDOMINAL HYSTERECTOMY    . GASTROPLASTY DUODENAL SWITCH    . PARTIAL HYSTERECTOMY    . SALPINGECTOMY    . TUBAL LIGATION      OB History    Gravida Para Term Preterm AB Living   3 2           SAB TAB Ectopic Multiple Live Births                   Home Medications    Prior to Admission medications   Medication Sig Start Date End Date Taking? Authorizing Provider  ALPRAZolam Duanne Moron) 1 MG tablet Take 1 tablet (1 mg total) by mouth 2 (two) times daily as needed for anxiety. 04/03/16   Roselee Nova, MD  amoxicillin-clavulanate (AUGMENTIN) 875-125 MG tablet Take 1 tablet by mouth 2 (two) times daily. 04/22/16   Norval Gable, MD  diclofenac sodium (VOLTAREN) 1 % GEL Apply 4 g topically 4 (four) times daily. 02/28/16   Roselee Nova, MD  glucose blood (ACCU-CHEK AVIVA PLUS) test strip 1 strip by Does not apply route daily. 03/31/14   Historical Provider, MD  Multiple Vitamin (MULTI-VITAMINS) TABS Take by mouth. Reported on 12/18/2015    Historical Provider, MD  oxyCODONE-acetaminophen (PERCOCET) 10-325 MG tablet Take 1 tablet by mouth 2 (two) times daily as needed for pain. 04/03/16   Roselee Nova, MD  pantoprazole (PROTONIX) 40 MG tablet Take 1 tablet (40 mg total) by mouth 2 (two) times daily. 05/02/15   Roselee Nova, MD  pramoxine-hydrocortisone (PRAMOSONE) cream Apply topically 3 (three) times daily. 02/28/16   Roselee Nova, MD  scopolamine (TRANSDERM-SCOP, 1.5 MG,) 1 MG/3DAYS Place 1 patch (1.5 mg total) onto the skin every 3 (three) days. 04/03/16   Roselee Nova, MD  tiZANidine (ZANAFLEX) 2 MG tablet Take 1 tablet (2 mg total) by mouth every 8 (eight) hours as needed for muscle spasms. 04/03/16   Roselee Nova, MD    Family History Family History  Problem Relation Age of Onset  . Heart disease Father   . Hypertension Father   . Hyperlipidemia Father   . Drug abuse Mother   . Anxiety disorder Sister   . Depression Sister     Social History Social History  Substance Use Topics  . Smoking status: Never Smoker  . Smokeless tobacco: Never Used  . Alcohol use No     Allergies   2,4-d dimethylamine  (amisol); Bee venom; Nsaids; Other; Tetanus toxoid adsorbed; and Tetanus toxoids   Review of Systems Review of Systems   Physical Exam Triage Vital Signs ED Triage Vitals  Enc Vitals Group     BP 04/22/16 1803 (!) 123/98     Pulse Rate 04/22/16 1803 80     Resp 04/22/16 1803 18     Temp 04/22/16 1803 98.1 F (36.7 C)     Temp Source 04/22/16 1803 Oral     SpO2 04/22/16 1803 100 %     Weight 04/22/16 1805 183 lb (83 kg)     Height 04/22/16 1805 5\' 10"  (1.778 m)     Head Circumference --      Peak Flow --      Pain Score 04/22/16 1805 8     Pain Loc --      Pain Edu? --      Excl. in [place]? --    No data found.   Updated Vital Signs BP (!) 123/98 (BP Location: Left Arm)   Pulse 80   Temp 98.1 F (36.7 C) (Oral)   Resp 18   Ht 5\' 10"  (1.778 m)   Wt 183 lb (83 kg)   SpO2 100%   BMI 26.26 kg/m   Visual Acuity Right Eye Distance:   Left Eye Distance:   Bilateral Distance:    Right Eye Near:   Left Eye Near:    Bilateral Near:     Physical Exam  Constitutional: She appears well-developed and well-nourished. No distress.  Skin: She is not diaphoretic.  2 x (3x6cm) erythematous skin lesions on right upper lateral thigh; no drainage  Nursing note and vitals reviewed.    UC Treatments / Results  Labs (all labs ordered are listed, but only abnormal results are displayed) Labs Reviewed - No data to display  EKG  EKG Interpretation None       Radiology No results found.  Procedures Procedures (including critical care time)  Medications Ordered in UC Medications - No data to display   Initial Impression / Assessment and Plan / UC Course  I have reviewed the triage vital signs and the nursing notes.  Pertinent labs & imaging results that were available during my care of the patient were reviewed by me and considered in my medical decision making (see chart for details).  Clinical Course       Final Clinical Impressions(s) / UC Diagnoses    Final diagnoses:  Insect bite, initial encounter  Cellulitis of right lower extremity    New Prescriptions Discharge  Medication List as of 04/22/2016  6:26 PM    START taking these medications   Details  amoxicillin-clavulanate (AUGMENTIN) 875-125 MG tablet Take 1 tablet by mouth 2 (two) times daily., Starting Mon 04/22/2016, Print       1. diagnosis reviewed with patient 2. rx as per orders above; reviewed possible side effects, interactions, risks and benefits  3. Warm compresses to surrounding area 4. Follow-up prn if symptoms worsen or don't improve   Norval Gable, MD 04/22/16 2005

## 2016-04-22 NOTE — ED Triage Notes (Addendum)
Pt was bitten by an unknown insect in [place] last Thursday. She says it initially it was on fire and blistered [place] and it drained for a few days. They returned home Saturday night.

## 2016-04-22 NOTE — Discharge Instructions (Signed)
Bacitracin ointment twice daily °

## 2016-05-02 ENCOUNTER — Ambulatory Visit (INDEPENDENT_AMBULATORY_CARE_PROVIDER_SITE_OTHER): Payer: PRIVATE HEALTH INSURANCE | Admitting: Family Medicine

## 2016-05-02 ENCOUNTER — Encounter: Payer: Self-pay | Admitting: Family Medicine

## 2016-05-02 DIAGNOSIS — B379 Candidiasis, unspecified: Secondary | ICD-10-CM | POA: Diagnosis not present

## 2016-05-02 DIAGNOSIS — M1711 Unilateral primary osteoarthritis, right knee: Secondary | ICD-10-CM | POA: Diagnosis not present

## 2016-05-02 DIAGNOSIS — S70361D Insect bite (nonvenomous), right thigh, subsequent encounter: Secondary | ICD-10-CM | POA: Diagnosis not present

## 2016-05-02 DIAGNOSIS — L089 Local infection of the skin and subcutaneous tissue, unspecified: Secondary | ICD-10-CM | POA: Diagnosis not present

## 2016-05-02 DIAGNOSIS — T3695XA Adverse effect of unspecified systemic antibiotic, initial encounter: Secondary | ICD-10-CM

## 2016-05-02 DIAGNOSIS — F4001 Agoraphobia with panic disorder: Secondary | ICD-10-CM

## 2016-05-02 DIAGNOSIS — M545 Low back pain, unspecified: Secondary | ICD-10-CM

## 2016-05-02 DIAGNOSIS — G8929 Other chronic pain: Secondary | ICD-10-CM

## 2016-05-02 DIAGNOSIS — W57XXXD Bitten or stung by nonvenomous insect and other nonvenomous arthropods, subsequent encounter: Secondary | ICD-10-CM

## 2016-05-02 MED ORDER — DICLOFENAC SODIUM 1 % TD GEL: 4.0000 g | Freq: Four times a day (QID) | TRANSDERMAL | 0 refills | Status: DC

## 2016-05-02 MED ORDER — OXYCODONE-ACETAMINOPHEN 10-325 MG PO TABS: 1.0000 | ORAL_TABLET | Freq: Two times a day (BID) | ORAL | 0 refills | Status: DC | PRN

## 2016-05-02 MED ORDER — ALPRAZOLAM 1 MG PO TABS
1.0000 mg | ORAL_TABLET | Freq: Two times a day (BID) | ORAL | 0 refills | Status: DC | PRN
Start: 2016-05-02 — End: 2016-06-10

## 2016-05-02 MED ORDER — FLUCONAZOLE 150 MG PO TABS: 150.0000 mg | ORAL_TABLET | Freq: Once | ORAL | 0 refills | Status: AC

## 2016-05-02 NOTE — Progress Notes (Signed)
Name: Kiara Le   MRN: [id number]    DOB: 09/02/1975   Date:05/02/2016       Progress Note  Subjective  Chief Complaint  Chief Complaint  Patient presents with  . Follow-up    Recheck leg from insect bite. Urgent care prescribed anti-biotic amox-clav 875mg .  . Medication Refill    voltaren, alprazolam & oxycodone    Anxiety  Presents for follow-up visit. Symptoms include depressed mood, excessive worry, irritability, nervous/anxious behavior, panic and shortness of breath (with panic attacks.). The severity of symptoms is moderate and causing significant distress. The quality of sleep is good.    Back Pain  This is a chronic problem. The pain is present in the lumbar spine and gluteal. The pain radiates to the right thigh (right groin). The pain is at a severity of 4/10. Pertinent negatives include no leg pain, numbness, paresis or tingling. She has tried analgesics for the symptoms.   Insect Bite: Pt. was on vacation in [place] and had an insect bite (not sure which type of insect), Bitten on her right thigh, started off with a sharp sting, felt immediate pain when she ran her hand into her pants. The lesion started off as a blister and started filling with a yellowish fluid. She was attended to by a local paramedic who drained some fluid with a needle. She was then seen by the ship's doctor who advised him to take Benadryl. When she finally arrived back home, she was seen at [facility] and the doctor looked at the lesion and considered it infectious, started her on Amoxicillin-Clavulanate for 7 days.   Past Medical History:  Diagnosis Date  . Anxiety   . Asthma   . Bipolar disorder ([place])   . Chronic fatigue   . Chronic low back pain   . Diabetes mellitus without complication ([place])   . Fatty liver disease, nonalcoholic   . History of Helicobacter pylori infection   . Hyperlipidemia   . Hypertension   . Insomnia   . Morbid obesity ([place])   . Panic disorder   .  PTSD (post-traumatic stress disorder)   . Vitamin B12 deficiency   . Vitamin D deficiency     Past Surgical History:  Procedure Laterality Date  . ABDOMINAL HYSTERECTOMY    . GASTROPLASTY DUODENAL SWITCH    . PARTIAL HYSTERECTOMY    . SALPINGECTOMY    . TUBAL LIGATION      Family History  Problem Relation Age of Onset  . Heart disease Father   . Hypertension Father   . Hyperlipidemia Father   . Drug abuse Mother   . Anxiety disorder Sister   . Depression Sister     Social History   Social History  . Marital status: Married    Spouse name: Michiel Cowboy  . Number of children: 2  . Years of education: N/A   Occupational History  . Customer Service    Social History Main Topics  . Smoking status: Never Smoker  . Smokeless tobacco: Never Used  . Alcohol use No  . Drug use: No  . Sexual activity: Yes    Partners: Male    Birth control/ protection: None   Other Topics Concern  . Not on file   Social History Narrative  . No narrative on file     Current Outpatient Prescriptions:  .  ALPRAZolam (XANAX) 1 MG tablet, Take 1 tablet (1 mg total) by mouth 2 (two) times daily as needed for anxiety.,  Disp: 60 tablet, Rfl: 0 .  diclofenac sodium (VOLTAREN) 1 % GEL, Apply 4 g topically 4 (four) times daily., Disp: 100 g, Rfl: 0 .  Multiple Vitamin (MULTI-VITAMINS) TABS, Take by mouth. Reported on 12/18/2015, Disp: , Rfl:  .  oxyCODONE-acetaminophen (PERCOCET) 10-325 MG tablet, Take 1 tablet by mouth 2 (two) times daily as needed for pain., Disp: 60 tablet, Rfl: 0 .  pantoprazole (PROTONIX) 40 MG tablet, Take 1 tablet (40 mg total) by mouth 2 (two) times daily., Disp: 180 tablet, Rfl: 0 .  tiZANidine (ZANAFLEX) 2 MG tablet, Take 1 tablet (2 mg total) by mouth every 8 (eight) hours as needed for muscle spasms., Disp: 90 tablet, Rfl: 0 .  amoxicillin-clavulanate (AUGMENTIN) 875-125 MG tablet, Take 1 tablet by mouth 2 (two) times daily. (Patient not taking: Reported on 05/02/2016),  Disp: 14 tablet, Rfl: 0 .  glucose blood (ACCU-CHEK AVIVA PLUS) test strip, 1 strip by Does not apply route daily., Disp: , Rfl:  .  pramoxine-hydrocortisone (PRAMOSONE) cream, Apply topically 3 (three) times daily. (Patient not taking: Reported on 05/02/2016), Disp: 57 g, Rfl: 0 .  scopolamine (TRANSDERM-SCOP, 1.5 MG,) 1 MG/3DAYS, Place 1 patch (1.5 mg total) onto the skin every 3 (three) days. (Patient not taking: Reported on 05/02/2016), Disp: 10 patch, Rfl: 0  Current Facility-Administered Medications:  .  fentaNYL (SUBLIMAZE) injection 100 mcg, 100 mcg, Intravenous, Once, Molli Barrows, MD .  iopamidol (ISOVUE-M) 41 % intrathecal injection 20 mL, 20 mL, Other, Once PRN, Molli Barrows, MD .  iopamidol (ISOVUE-M) 41 % intrathecal injection 20 mL, 20 mL, Other, Once PRN, Molli Barrows, MD .  lactated ringers infusion 1,000 mL, 1,000 mL, Intravenous, Continuous, Molli Barrows, MD .  lactated ringers infusion 1,000 mL, 1,000 mL, Intravenous, Continuous, Molli Barrows, MD .  midazolam (VERSED) 5 MG/5ML injection 5 mg, 5 mg, Intravenous, Once, Molli Barrows, MD .  midazolam (VERSED) injection 5 mg, 5 mg, Intravenous, Once, Molli Barrows, MD .  ropivacaine (PF) 2 mg/ml (0.2%) (NAROPIN) epidural 10 mL, 10 mL, Epidural, Once, Molli Barrows, MD .  triamcinolone acetonide (KENALOG-40) injection 40 mg, 40 mg, Other, Once, Molli Barrows, MD  Allergies  Allergen Reactions  . 2,4-D Dimethylamine (Amisol) Swelling  . Bee Venom Swelling  . Nsaids Other (See Comments)    Cannot take due to abd surgery  . Other Hives and Other (See Comments)    Cannot take due to abd surgery Cannot take due to abd surgery  . Tetanus Toxoid Adsorbed Other (See Comments)    Swelling, hot  . Tetanus Toxoids     Swelling, hot     Review of Systems  Constitutional: Positive for irritability.  Respiratory: Positive for shortness of breath (with panic attacks.).   Musculoskeletal: Positive for back pain.  Neurological:  Negative for tingling and numbness.  Psychiatric/Behavioral: The patient is nervous/anxious.     Objective  Vitals:   05/02/16 1048  BP: 104/68  Pulse: 79  Temp: 98.4 F (36.9 C)  SpO2: 98%  Weight: 183 lb 6.4 oz (83.2 kg)  Height: 5\' 10"  (1.778 m)    Physical Exam  Constitutional: She is oriented to person, place, and time and well-developed, well-nourished, and in no distress.  Cardiovascular: Normal rate, regular rhythm and normal heart sounds.   No murmur heard. Pulmonary/Chest: Effort normal and breath sounds normal. She has no wheezes.  Musculoskeletal:       Lumbar back: She exhibits tenderness, pain  and spasm.       Back:  Neurological: She is alert and oriented to person, place, and time.  Skin: Skin is warm and dry. Rash noted. Rash is maculopapular. There is erythema.  Healing insect bite lesion on the right lateral thigh, erythematous with patches of dryness, no drainage or abscess.  Psychiatric: Mood, memory, affect and judgment normal.  Nursing note and vitals reviewed.    Assessment & Plan  1. Arthritis of right knee  - diclofenac sodium (VOLTAREN) 1 % GEL; Apply 4 g topically 4 (four) times daily.  Dispense: 100 g; Refill: 0  2. Chronic right-sided low back pain without sciatica Stable and responsive to opioid therapy, patient compliant with controlled substances agreement. Refills provided - oxyCODONE-acetaminophen (PERCOCET) 10-325 MG tablet; Take 1 tablet by mouth 2 (two) times daily as needed for pain.  Dispense: 60 tablet; Refill: 0  3. Panic disorder with agoraphobia and moderate panic attacks  - ALPRAZolam (XANAX) 1 MG tablet; Take 1 tablet (1 mg total) by mouth 2 (two) times daily as needed for anxiety.  Dispense: 60 tablet; Refill: 0  4. Insect bite of thigh, infected, right, subsequent encounter Resolving, no further antibiotics indicated.  5. Antibiotic-induced yeast infection  - fluconazole (DIFLUCAN) 150 MG tablet; Take 1 tablet (150  mg total) by mouth once.  Dispense: 1 tablet; Refill: 0   Brekyn Huntoon [facility] 05/02/2016 11:05 AM

## 2016-05-06 ENCOUNTER — Ambulatory Visit: Payer: PRIVATE HEALTH INSURANCE | Admitting: Anesthesiology

## 2016-06-06 ENCOUNTER — Ambulatory Visit: Payer: Self-pay | Admitting: Family Medicine

## 2016-06-07 ENCOUNTER — Telehealth: Payer: Self-pay | Admitting: Family Medicine

## 2016-06-07 NOTE — Telephone Encounter (Signed)
PT NEEDS REFILLS ON PAIN MEDS AND ALPRAZALM. SHE HAD AN APPT YESTERDAY BUT IT WAS CANCELED DUE TO WEATHER AND SHE HAS ON NOW FOR JAN 30TH BUT SHE IS OUT OF HER MEDS.

## 2016-06-07 NOTE — Telephone Encounter (Signed)
Spoke with patient and she has been scheduled for 06/11/2015 for medication refill

## 2016-06-10 ENCOUNTER — Other Ambulatory Visit: Payer: Self-pay | Admitting: Family Medicine

## 2016-06-10 ENCOUNTER — Encounter: Payer: Self-pay | Admitting: Family Medicine

## 2016-06-10 ENCOUNTER — Ambulatory Visit (INDEPENDENT_AMBULATORY_CARE_PROVIDER_SITE_OTHER): Payer: Medicaid Other | Admitting: Family Medicine

## 2016-06-10 DIAGNOSIS — M545 Low back pain, unspecified: Secondary | ICD-10-CM

## 2016-06-10 DIAGNOSIS — F4001 Agoraphobia with panic disorder: Secondary | ICD-10-CM

## 2016-06-10 DIAGNOSIS — K219 Gastro-esophageal reflux disease without esophagitis: Secondary | ICD-10-CM

## 2016-06-10 DIAGNOSIS — G8929 Other chronic pain: Secondary | ICD-10-CM | POA: Diagnosis not present

## 2016-06-10 MED ORDER — OXYCODONE-ACETAMINOPHEN 10-325 MG PO TABS: 1.0000 | ORAL_TABLET | Freq: Two times a day (BID) | ORAL | 0 refills | Status: DC | PRN

## 2016-06-10 MED ORDER — ALPRAZOLAM 1 MG PO TABS: 1.0000 mg | ORAL_TABLET | Freq: Two times a day (BID) | ORAL | 0 refills | Status: DC | PRN

## 2016-06-10 NOTE — Progress Notes (Signed)
Name: Kiara Le   MRN: [id number]    DOB: 14-Aug-1975   Date:06/10/2016       Progress Note  Subjective  Chief Complaint  Chief Complaint  Patient presents with  . Follow-up    medication refills    Anxiety  Presents for follow-up visit. Symptoms include depressed mood, excessive worry, irritability, nervous/anxious behavior, panic and shortness of breath (with panic attacks.). The severity of symptoms is moderate and causing significant distress. The quality of sleep is good.    Back Pain  This is a chronic problem. The pain is present in the lumbar spine and gluteal. The pain radiates to the right thigh (right groin). The pain is at a severity of 4/10. Pertinent negatives include no leg pain, numbness, paresis or tingling. She has tried analgesics for the symptoms.      Past Medical History:  Diagnosis Date  . Anxiety   . Asthma   . Bipolar disorder ([place])   . Chronic fatigue   . Chronic low back pain   . Diabetes mellitus without complication ([place])   . Fatty liver disease, nonalcoholic   . History of Helicobacter pylori infection   . Hyperlipidemia   . Hypertension   . Insomnia   . Morbid obesity ([place])   . Panic disorder   . PTSD (post-traumatic stress disorder)   . Vitamin B12 deficiency   . Vitamin D deficiency     Past Surgical History:  Procedure Laterality Date  . ABDOMINAL HYSTERECTOMY    . GASTROPLASTY DUODENAL SWITCH    . PARTIAL HYSTERECTOMY    . SALPINGECTOMY    . TUBAL LIGATION      Family History  Problem Relation Age of Onset  . Heart disease Father   . Hypertension Father   . Hyperlipidemia Father   . Drug abuse Mother   . Anxiety disorder Sister   . Depression Sister     Social History   Social History  . Marital status: Married    Spouse name: Michiel Cowboy  . Number of children: 2  . Years of education: N/A   Occupational History  . Customer Service    Social History Main Topics  . Smoking status: Never Smoker  . Smokeless  tobacco: Never Used  . Alcohol use No  . Drug use: No  . Sexual activity: Yes    Partners: Male    Birth control/ protection: None   Other Topics Concern  . Not on file   Social History Narrative  . No narrative on file     Current Outpatient Prescriptions:  .  ALPRAZolam (XANAX) 1 MG tablet, Take 1 tablet (1 mg total) by mouth 2 (two) times daily as needed for anxiety., Disp: 60 tablet, Rfl: 0 .  diclofenac sodium (VOLTAREN) 1 % GEL, Apply 4 g topically 4 (four) times daily., Disp: 100 g, Rfl: 0 .  glucose blood (ACCU-CHEK AVIVA PLUS) test strip, 1 strip by Does not apply route daily., Disp: , Rfl:  .  Multiple Vitamin (MULTI-VITAMINS) TABS, Take by mouth. Reported on 12/18/2015, Disp: , Rfl:  .  oxyCODONE-acetaminophen (PERCOCET) 10-325 MG tablet, Take 1 tablet by mouth 2 (two) times daily as needed for pain., Disp: 60 tablet, Rfl: 0 .  pantoprazole (PROTONIX) 40 MG tablet, Take 1 tablet (40 mg total) by mouth 2 (two) times daily., Disp: 180 tablet, Rfl: 0 .  pramoxine-hydrocortisone (PRAMOSONE) cream, Apply topically 3 (three) times daily., Disp: 57 g, Rfl: 0 .  scopolamine (TRANSDERM-SCOP, 1.5 MG,)  1 MG/3DAYS, Place 1 patch (1.5 mg total) onto the skin every 3 (three) days., Disp: 10 patch, Rfl: 0 .  tiZANidine (ZANAFLEX) 2 MG tablet, Take 1 tablet (2 mg total) by mouth every 8 (eight) hours as needed for muscle spasms., Disp: 90 tablet, Rfl: 0  Current Facility-Administered Medications:  .  fentaNYL (SUBLIMAZE) injection 100 mcg, 100 mcg, Intravenous, Once, Molli Barrows, MD .  iopamidol (ISOVUE-M) 41 % intrathecal injection 20 mL, 20 mL, Other, Once PRN, Molli Barrows, MD .  iopamidol (ISOVUE-M) 41 % intrathecal injection 20 mL, 20 mL, Other, Once PRN, Molli Barrows, MD .  lactated ringers infusion 1,000 mL, 1,000 mL, Intravenous, Continuous, Molli Barrows, MD .  lactated ringers infusion 1,000 mL, 1,000 mL, Intravenous, Continuous, Molli Barrows, MD .  midazolam (VERSED) 5  MG/5ML injection 5 mg, 5 mg, Intravenous, Once, Molli Barrows, MD .  midazolam (VERSED) injection 5 mg, 5 mg, Intravenous, Once, Molli Barrows, MD .  ropivacaine (PF) 2 mg/ml (0.2%) (NAROPIN) epidural 10 mL, 10 mL, Epidural, Once, Molli Barrows, MD .  triamcinolone acetonide (KENALOG-40) injection 40 mg, 40 mg, Other, Once, Molli Barrows, MD  Allergies  Allergen Reactions  . 2,4-D Dimethylamine (Amisol) Swelling  . Bee Venom Swelling  . Nsaids Other (See Comments)    Cannot take due to abd surgery  . Other Hives and Other (See Comments)    Cannot take due to abd surgery Cannot take due to abd surgery  . Tetanus Toxoid Adsorbed Other (See Comments)    Swelling, hot  . Tetanus Toxoids     Swelling, hot     Review of Systems  Constitutional: Positive for irritability.  Respiratory: Positive for shortness of breath (with panic attacks.).   Musculoskeletal: Positive for back pain.  Neurological: Negative for tingling and numbness.  Psychiatric/Behavioral: The patient is nervous/anxious.     Objective  Vitals:   06/10/16 1154  BP: 110/68  Pulse: 78  Resp: 16  Temp: 98.4 F (36.9 C)  TempSrc: Oral  SpO2: 96%  Weight: 182 lb 14.4 oz (83 kg)  Height: 5\' 10"  (1.778 m)    Physical Exam  Constitutional: She is oriented to person, place, and time and well-developed, well-nourished, and in no distress.  Cardiovascular: Normal rate, regular rhythm and normal heart sounds.   No murmur heard. Pulmonary/Chest: Effort normal and breath sounds normal. She has no wheezes.  Musculoskeletal:       Lumbar back: She exhibits tenderness, pain and spasm.       Back:  Neurological: She is alert and oriented to person, place, and time.  Skin: Skin is warm and dry. Rash noted. Rash is maculopapular. There is erythema.  Healing insect bite lesion on the right lateral thigh, erythematous with patches of dryness, no drainage or abscess.  Psychiatric: Mood, memory, affect and judgment normal.    Nursing note and vitals reviewed.     Assessment & Plan  1. Chronic right-sided low back pain without sciatica Stable responsive to opioid therapy, patient compliant with controlled substances agreement. Refills provided - oxyCODONE-acetaminophen (PERCOCET) 10-325 MG tablet; Take 1 tablet by mouth 2 (two) times daily as needed for pain.  Dispense: 60 tablet; Refill: 0  2. Panic disorder with agoraphobia and moderate panic attacks  - ALPRAZolam (XANAX) 1 MG tablet; Take 1 tablet (1 mg total) by mouth 2 (two) times daily as needed for anxiety.  Dispense: 60 tablet; Refill: 0   Okey Dupre  [facility] 06/10/2016 12:04 PM

## 2016-07-02 ENCOUNTER — Ambulatory Visit (INDEPENDENT_AMBULATORY_CARE_PROVIDER_SITE_OTHER): Payer: BLUE CROSS/BLUE SHIELD | Admitting: Family Medicine

## 2016-07-02 ENCOUNTER — Encounter: Payer: Self-pay | Admitting: Family Medicine

## 2016-07-02 VITALS — BP 108/67 | HR 87 | Temp 98.3°F | Resp 16 | Ht 70.0 in | Wt 182.4 lb

## 2016-07-02 DIAGNOSIS — M545 Low back pain, unspecified: Secondary | ICD-10-CM

## 2016-07-02 DIAGNOSIS — F4001 Agoraphobia with panic disorder: Secondary | ICD-10-CM | POA: Diagnosis not present

## 2016-07-02 DIAGNOSIS — G8929 Other chronic pain: Secondary | ICD-10-CM | POA: Diagnosis not present

## 2016-07-02 DIAGNOSIS — M1711 Unilateral primary osteoarthritis, right knee: Secondary | ICD-10-CM | POA: Diagnosis not present

## 2016-07-02 MED ORDER — ALPRAZOLAM 1 MG PO TABS: 1.0000 mg | ORAL_TABLET | Freq: Two times a day (BID) | ORAL | 0 refills | Status: DC | PRN

## 2016-07-02 MED ORDER — OXYCODONE-ACETAMINOPHEN 10-325 MG PO TABS: 1.0000 | ORAL_TABLET | Freq: Two times a day (BID) | ORAL | 0 refills | Status: DC | PRN

## 2016-07-02 NOTE — Progress Notes (Signed)
Name: Kiara Le   MRN: [id number]    DOB: 1975/08/15   Date:07/02/2016       Progress Note  Subjective  Chief Complaint  Chief Complaint  Patient presents with  . Follow-up    1 mo  . Medication Refill    Anxiety  Presents for follow-up visit. Symptoms include depressed mood, excessive worry, irritability, nervous/anxious behavior, panic and shortness of breath (with panic attacks.). The severity of symptoms is moderate and causing significant distress. The quality of sleep is good.    Back Pain  This is a chronic problem. The problem occurs constantly. The pain is present in the lumbar spine and gluteal. The pain radiates to the right thigh (right groin). The pain is at a severity of 3/10. The symptoms are aggravated by position and bending (worse with usual household activities). Pertinent negatives include no leg pain, numbness, paresis or tingling. She has tried analgesics for the symptoms.    Patient has osteoarthritis of the right knee, has been taking Voltaren gel which is no longer going to be covered by her insurance company.  Past Medical History:  Diagnosis Date  . Anxiety   . Asthma   . Bipolar disorder ([place])   . Chronic fatigue   . Chronic low back pain   . Diabetes mellitus without complication ([place])   . Fatty liver disease, nonalcoholic   . History of Helicobacter pylori infection   . Hyperlipidemia   . Hypertension   . Insomnia   . Morbid obesity ([place])   . Panic disorder   . PTSD (post-traumatic stress disorder)   . Vitamin B12 deficiency   . Vitamin D deficiency     Past Surgical History:  Procedure Laterality Date  . ABDOMINAL HYSTERECTOMY    . GASTROPLASTY DUODENAL SWITCH    . PARTIAL HYSTERECTOMY    . SALPINGECTOMY    . TUBAL LIGATION      Family History  Problem Relation Age of Onset  . Heart disease Father   . Hypertension Father   . Hyperlipidemia Father   . Drug abuse Mother   . Anxiety disorder Sister   . Depression Sister      Social History   Social History  . Marital status: Married    Spouse name: Michiel Cowboy  . Number of children: 2  . Years of education: N/A   Occupational History  . Customer Service    Social History Main Topics  . Smoking status: Never Smoker  . Smokeless tobacco: Never Used  . Alcohol use No  . Drug use: No  . Sexual activity: Yes    Partners: Male    Birth control/ protection: None   Other Topics Concern  . Not on file   Social History Narrative  . No narrative on file     Current Outpatient Prescriptions:  .  ALPRAZolam (XANAX) 1 MG tablet, Take 1 tablet (1 mg total) by mouth 2 (two) times daily as needed for anxiety., Disp: 60 tablet, Rfl: 0 .  diclofenac sodium (VOLTAREN) 1 % GEL, Apply 4 g topically 4 (four) times daily., Disp: 100 g, Rfl: 0 .  glucose blood (ACCU-CHEK AVIVA PLUS) test strip, 1 strip by Does not apply route daily., Disp: , Rfl:  .  Multiple Vitamin (MULTI-VITAMINS) TABS, Take by mouth. Reported on 12/18/2015, Disp: , Rfl:  .  oxyCODONE-acetaminophen (PERCOCET) 10-325 MG tablet, Take 1 tablet by mouth 2 (two) times daily as needed for pain., Disp: 60 tablet, Rfl: 0 .  pantoprazole (PROTONIX) 40 MG tablet, TAKE ONE TABLET TWICE DAILY, Disp: 180 tablet, Rfl: 1 .  tiZANidine (ZANAFLEX) 2 MG tablet, Take 1 tablet (2 mg total) by mouth every 8 (eight) hours as needed for muscle spasms., Disp: 90 tablet, Rfl: 0  Current Facility-Administered Medications:  .  fentaNYL (SUBLIMAZE) injection 100 mcg, 100 mcg, Intravenous, Once, Molli Barrows, MD .  iopamidol (ISOVUE-M) 41 % intrathecal injection 20 mL, 20 mL, Other, Once PRN, Molli Barrows, MD .  iopamidol (ISOVUE-M) 41 % intrathecal injection 20 mL, 20 mL, Other, Once PRN, Molli Barrows, MD .  lactated ringers infusion 1,000 mL, 1,000 mL, Intravenous, Continuous, Molli Barrows, MD .  lactated ringers infusion 1,000 mL, 1,000 mL, Intravenous, Continuous, Molli Barrows, MD .  midazolam (VERSED) 5 MG/5ML  injection 5 mg, 5 mg, Intravenous, Once, Molli Barrows, MD .  midazolam (VERSED) injection 5 mg, 5 mg, Intravenous, Once, Molli Barrows, MD .  ropivacaine (PF) 2 mg/ml (0.2%) (NAROPIN) epidural 10 mL, 10 mL, Epidural, Once, Molli Barrows, MD .  triamcinolone acetonide (KENALOG-40) injection 40 mg, 40 mg, Other, Once, Molli Barrows, MD  Allergies  Allergen Reactions  . 2,4-D Dimethylamine (Amisol) Swelling  . Bee Venom Swelling  . Nsaids Other (See Comments)    Cannot take due to abd surgery  . Other Hives and Other (See Comments)    Cannot take due to abd surgery Cannot take due to abd surgery  . Tetanus Toxoid Adsorbed Other (See Comments)    Swelling, hot  . Tetanus Toxoids     Swelling, hot     Review of Systems  Constitutional: Positive for irritability.  Respiratory: Positive for shortness of breath (with panic attacks.).   Musculoskeletal: Positive for back pain.  Neurological: Negative for tingling and numbness.  Psychiatric/Behavioral: The patient is nervous/anxious.     Objective  Vitals:   07/02/16 0836  BP: 108/67  Pulse: 87  Resp: 16  Temp: 98.3 F (36.8 C)  TempSrc: Oral  SpO2: 96%  Weight: 182 lb 6.4 oz (82.7 kg)  Height: 5\' 10"  (1.778 m)    Physical Exam  Constitutional: She is oriented to person, place, and time and well-developed, well-nourished, and in no distress.  HENT:  Head: Normocephalic and atraumatic.  Cardiovascular: Normal rate, regular rhythm and normal heart sounds.   No murmur heard. Pulmonary/Chest: Effort normal and breath sounds normal. She has no wheezes.  Musculoskeletal:       Lumbar back: She exhibits tenderness and pain.       Back:  Neurological: She is alert and oriented to person, place, and time.  Psychiatric: Mood, memory, affect and judgment normal.  Nursing note and vitals reviewed.     Assessment & Plan  1. Chronic right-sided low back pain without sciatica Stable, responsive to opioid therapy. Patient  compliant with controlled substances agreement - oxyCODONE-acetaminophen (PERCOCET) 10-325 MG tablet; Take 1 tablet by mouth 2 (two) times daily as needed for pain.  Dispense: 60 tablet; Refill: 0  2. Panic disorder with agoraphobia and moderate panic attacks Symptoms stable on alprazolam taken twice daily when needed, patient compliant with controlled substances agreement and understands the dependence potential, side effects, drug interactions of benzos especially with opioids. Refills provided - ALPRAZolam (XANAX) 1 MG tablet; Take 1 tablet (1 mg total) by mouth 2 (two) times daily as needed for anxiety.  Dispense: 60 tablet; Refill: 0  3. Arthritis of right knee Voltaren gel is no  longer covered by insurance, she will find out any alternative drugs that are covered by her insurance company.    Annalysse Shoemaker [facility] 07/02/2016 8:48 AM

## 2016-08-01 ENCOUNTER — Ambulatory Visit (INDEPENDENT_AMBULATORY_CARE_PROVIDER_SITE_OTHER): Payer: BLUE CROSS/BLUE SHIELD | Admitting: Family Medicine

## 2016-08-01 ENCOUNTER — Encounter: Payer: Self-pay | Admitting: Family Medicine

## 2016-08-01 DIAGNOSIS — F4001 Agoraphobia with panic disorder: Secondary | ICD-10-CM

## 2016-08-01 DIAGNOSIS — G8929 Other chronic pain: Secondary | ICD-10-CM

## 2016-08-01 DIAGNOSIS — M545 Low back pain: Secondary | ICD-10-CM

## 2016-08-01 MED ORDER — ALPRAZOLAM 1 MG PO TABS: 1.0000 mg | ORAL_TABLET | Freq: Two times a day (BID) | ORAL | 0 refills | Status: DC | PRN

## 2016-08-01 MED ORDER — OXYCODONE-ACETAMINOPHEN 10-325 MG PO TABS: 1.0000 | ORAL_TABLET | Freq: Two times a day (BID) | ORAL | 0 refills | Status: DC | PRN

## 2016-08-01 NOTE — Progress Notes (Signed)
Name: Kiara Le   MRN: [id number]    DOB: 05-Sep-1975   Date:08/01/2016       Progress Note  Subjective  Chief Complaint  Chief Complaint  Patient presents with  . Follow-up    1 mo  . Medication Refill    Anxiety  Presents for follow-up visit. Symptoms include depressed mood, excessive worry, irritability, nausea, nervous/anxious behavior, panic and shortness of breath (with panic attacks.). Patient reports no feeling of choking. The severity of symptoms is moderate and causing significant distress. The quality of sleep is good.    Back Pain  This is a chronic problem. The problem occurs constantly. The pain is present in the lumbar spine and gluteal. The pain radiates to the right thigh (right groin). The pain is at a severity of 6/10. 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 Pertinent negatives include no leg pain, numbness, paresis or tingling. She has tried analgesics for the symptoms.     Past Medical History:  Diagnosis Date  . Anxiety   . Asthma   . Bipolar disorder ([place])   . Chronic fatigue   . Chronic low back pain   . Diabetes mellitus without complication ([place])   . Fatty liver disease, nonalcoholic   . History of Helicobacter pylori infection   . Hyperlipidemia   . Hypertension   . Insomnia   . Morbid obesity ([place])   . Panic disorder   . PTSD (post-traumatic stress disorder)   . Vitamin B12 deficiency   . Vitamin D deficiency     Past Surgical History:  Procedure Laterality Date  . ABDOMINAL HYSTERECTOMY    . GASTROPLASTY DUODENAL SWITCH    . PARTIAL HYSTERECTOMY    . SALPINGECTOMY    . TUBAL LIGATION      Family History  Problem Relation Age of Onset  . Heart disease Father   . Hypertension Father   . Hyperlipidemia Father   . Drug abuse Mother   . Anxiety disorder Sister   . Depression Sister     Social History   Social History   . Marital status: Married    Spouse name: Michiel Cowboy  . Number of children: 2  . Years of education: N/A   Occupational History  . Customer Service    Social History Main Topics  . Smoking status: Never Smoker  . Smokeless tobacco: Never Used  . Alcohol use No  . Drug use: No  . Sexual activity: Yes    Partners: Male    Birth control/ protection: None   Other Topics Concern  . Not on file   Social History Narrative  . No narrative on file     Current Outpatient Prescriptions:  .  ALPRAZolam (XANAX) 1 MG tablet, Take 1 tablet (1 mg total) by mouth 2 (two) times daily as needed for anxiety., Disp: 60 tablet, Rfl: 0 .  diclofenac sodium (VOLTAREN) 1 % GEL, Apply 4 g topically 4 (four) times daily., Disp: 100 g, Rfl: 0 .  glucose blood (ACCU-CHEK AVIVA PLUS) test strip, 1 strip by Does not apply route daily., Disp: , Rfl:  .  Multiple Vitamin (MULTI-VITAMINS) TABS, Take by mouth. Reported on 12/18/2015, Disp: , Rfl:  .  oxyCODONE-acetaminophen (PERCOCET) 10-325 MG tablet, Take 1 tablet by mouth 2 (two) times daily as needed for pain., Disp: 60 tablet, Rfl: 0 .  pantoprazole (PROTONIX) 40 MG tablet,  TAKE ONE TABLET TWICE DAILY, Disp: 180 tablet, Rfl: 1 .  tiZANidine (ZANAFLEX) 2 MG tablet, Take 1 tablet (2 mg total) by mouth every 8 (eight) hours as needed for muscle spasms., Disp: 90 tablet, Rfl: 0  Current Facility-Administered Medications:  .  fentaNYL (SUBLIMAZE) injection 100 mcg, 100 mcg, Intravenous, Once, Molli Barrows, MD .  iopamidol (ISOVUE-M) 41 % intrathecal injection 20 mL, 20 mL, Other, Once PRN, Molli Barrows, MD .  iopamidol (ISOVUE-M) 41 % intrathecal injection 20 mL, 20 mL, Other, Once PRN, Molli Barrows, MD .  lactated ringers infusion 1,000 mL, 1,000 mL, Intravenous, Continuous, Molli Barrows, MD .  lactated ringers infusion 1,000 mL, 1,000 mL, Intravenous, Continuous, Molli Barrows, MD .  midazolam (VERSED) 5 MG/5ML injection 5 mg, 5 mg, Intravenous, Once, Molli Barrows, MD .  midazolam (VERSED) injection 5 mg, 5 mg, Intravenous, Once, Molli Barrows, MD .  ropivacaine (PF) 2 mg/ml (0.2%) (NAROPIN) epidural 10 mL, 10 mL, Epidural, Once, Molli Barrows, MD .  triamcinolone acetonide (KENALOG-40) injection 40 mg, 40 mg, Other, Once, Molli Barrows, MD  Allergies  Allergen Reactions  . 2,4-D Dimethylamine (Amisol) Swelling  . Bee Venom Swelling  . Nsaids Other (See Comments)    Cannot take due to abd surgery  . Other Hives and Other (See Comments)    Cannot take due to abd surgery Cannot take due to abd surgery  . Tetanus Toxoid Adsorbed Other (See Comments)    Swelling, hot  . Tetanus Toxoids     Swelling, hot     Review of Systems  Constitutional: Positive for irritability.  Respiratory: Positive for shortness of breath (with panic attacks.).   Gastrointestinal: Positive for nausea.  Musculoskeletal: Positive for back pain.  Neurological: Negative for tingling and numbness.  Psychiatric/Behavioral: The patient is nervous/anxious.     Objective  Vitals:   08/01/16 0923  BP: 102/68  Pulse: 93  Resp: 17  Temp: 98.2 F (36.8 C)  TempSrc: Oral  SpO2: 96%  Weight: 186 lb 1.6 oz (84.4 kg)  Height: 5\' 10"  (1.778 m)    Physical Exam  Constitutional: She is oriented to person, place, and time and well-developed, well-nourished, and in no distress.  Cardiovascular: Normal rate, regular rhythm and normal heart sounds.   No murmur heard. Pulmonary/Chest: Effort normal and breath sounds normal. She has no wheezes.  Musculoskeletal:       Lumbar back: She exhibits tenderness, pain and spasm.       Back:  Neurological: She is alert and oriented to person, place, and time.  Psychiatric: Mood, memory, affect and judgment normal.  Nursing note and vitals reviewed.       Assessment & Plan  1. Chronic right-sided low back pain without sciatica Stable, responsive to opioid therapy,  patient compliant with controlled substances  agreement. Refills provided - oxyCODONE-acetaminophen (PERCOCET) 10-325 MG tablet; Take 1 tablet by mouth 2 (two) times daily as needed for pain.  Dispense: 60 tablet; Refill: 0  2. Panic disorder with agoraphobia and moderate panic attacks Continue alprazolam twice daily for anxiety and panic disorder, symptoms controlled, no side effects. - ALPRAZolam (XANAX) 1 MG tablet; Take 1 tablet (1 mg total) by mouth 2 (two) times daily as needed for anxiety.  Dispense: 60 tablet; Refill: 0   Nashley Cordoba [facility] 08/01/2016 9:48 AM

## 2016-09-04 ENCOUNTER — Ambulatory Visit (INDEPENDENT_AMBULATORY_CARE_PROVIDER_SITE_OTHER): Payer: BLUE CROSS/BLUE SHIELD | Admitting: Family Medicine

## 2016-09-04 ENCOUNTER — Encounter: Payer: Self-pay | Admitting: Family Medicine

## 2016-09-04 VITALS — BP 110/60 | HR 75 | Temp 98.1°F | Resp 16 | Ht 70.0 in | Wt 190.5 lb

## 2016-09-04 DIAGNOSIS — Z9884 Bariatric surgery status: Secondary | ICD-10-CM

## 2016-09-04 DIAGNOSIS — F4001 Agoraphobia with panic disorder: Secondary | ICD-10-CM

## 2016-09-04 DIAGNOSIS — M545 Low back pain, unspecified: Secondary | ICD-10-CM

## 2016-09-04 DIAGNOSIS — G8929 Other chronic pain: Secondary | ICD-10-CM

## 2016-09-04 DIAGNOSIS — J029 Acute pharyngitis, unspecified: Secondary | ICD-10-CM | POA: Diagnosis not present

## 2016-09-04 LAB — COMPLETE METABOLIC PANEL WITH GFR
ALT: 27 U/L (ref 6–29)
AST: 18 U/L (ref 10–30)
Albumin: 4 g/dL (ref 3.6–5.1)
Alkaline Phosphatase: 67 U/L (ref 33–115)
BUN: 10 mg/dL (ref 7–25)
CALCIUM: 8.4 mg/dL — AB (ref 8.6–10.2)
CHLORIDE: 106 mmol/L (ref 98–110)
CO2: 26 mmol/L (ref 20–31)
Creat: 0.59 mg/dL (ref 0.50–1.10)
GFR, Est Non African American: >89 mL/min (ref >=60)
Glucose, Bld: 81 mg/dL (ref 65–99)
POTASSIUM: 4.3 mmol/L (ref 3.5–5.3)
Sodium: 139 mmol/L (ref 135–146)
Total Bilirubin: 0.4 mg/dL (ref 0.2–1.2)
Total Protein: 6.5 g/dL (ref 6.1–8.1)

## 2016-09-04 LAB — LIPID PANEL
CHOL/HDL RATIO: 2.7 ratio (ref <5.0)
CHOLESTEROL: 129 mg/dL (ref <200)
HDL: 47 mg/dL — ABNORMAL LOW (ref >50)
LDL Cholesterol: 61 mg/dL (ref <100)
TRIGLYCERIDES: 105 mg/dL (ref <150)
VLDL: 21 mg/dL (ref <30)

## 2016-09-04 LAB — POCT RAPID STREP A (OFFICE): RAPID STREP A SCREEN: NEGATIVE

## 2016-09-04 MED ORDER — ALPRAZOLAM 1 MG PO TABS: 1.0000 mg | ORAL_TABLET | Freq: Two times a day (BID) | ORAL | 0 refills | Status: DC | PRN

## 2016-09-04 MED ORDER — OXYCODONE-ACETAMINOPHEN 10-325 MG PO TABS: 1.0000 | ORAL_TABLET | Freq: Two times a day (BID) | ORAL | 0 refills | Status: DC | PRN

## 2016-09-04 NOTE — Progress Notes (Signed)
Name: Kiara Le   MRN: [id number]    DOB: 07-Dec-1975   Date:09/04/2016       Progress Note  Subjective  Chief Complaint  Chief Complaint  Patient presents with  . Medication Refill  . Sore Throat  . Dizziness    Sore Throat   This is a new problem. The current episode started in the past 7 days (3 days ago). There has been no fever. Associated symptoms include shortness of breath (with panic attacks.). Pertinent negatives include no ear pain. 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 The treatment provided no relief.  Anxiety  Presents for follow-up visit. Symptoms include depressed mood, excessive worry, irritability, nausea, nervous/anxious behavior, panic and shortness of breath (with panic attacks.). Patient reports no feeling of choking. The severity of symptoms is moderate and causing significant distress. The quality of sleep is good.    Back Pain  This is a chronic problem. The problem occurs daily. The pain is present in the lumbar spine and gluteal. The pain radiates to the right thigh (right groin). The pain is at a severity of 3/10. 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 Pertinent negatives include no leg pain, numbness, paresis or tingling. She has tried analgesics (chronic opioid therapy.) for the symptoms.     Past Medical History:  Diagnosis Date  . Anxiety   . Asthma   . Bipolar disorder ([place])   . Chronic fatigue   . Chronic low back pain   . Diabetes mellitus without complication ([place])   . Fatty liver disease, nonalcoholic   . History of Helicobacter pylori infection   . Hyperlipidemia   . Hypertension   . Insomnia   . Morbid obesity ([place])   . Panic disorder   . PTSD (post-traumatic stress disorder)   . Vitamin B12 deficiency   . Vitamin D deficiency     Past Surgical History:  Procedure Laterality Date  . ABDOMINAL HYSTERECTOMY    .  GASTROPLASTY DUODENAL SWITCH    . PARTIAL HYSTERECTOMY    . SALPINGECTOMY    . TUBAL LIGATION      Family History  Problem Relation Age of Onset  . Heart disease Father   . Hypertension Father   . Hyperlipidemia Father   . Drug abuse Mother   . Anxiety disorder Sister   . Depression Sister     Social History   Social History  . Marital status: Married    Spouse name: Michiel Cowboy  . Number of children: 2  . Years of education: N/A   Occupational History  . Customer Service    Social History Main Topics  . Smoking status: Never Smoker  . Smokeless tobacco: Never Used  . Alcohol use No  . Drug use: No  . Sexual activity: Yes    Partners: Male    Birth control/ protection: None   Other Topics Concern  . Not on file   Social History Narrative  . No narrative on file     Current Outpatient Prescriptions:  .  ALPRAZolam (XANAX) 1 MG tablet, Take 1 tablet (1 mg total) by mouth 2 (two) times daily as needed for anxiety., Disp: 60 tablet, Rfl: 0 .  diclofenac sodium (VOLTAREN) 1 % GEL, Apply 4 g topically 4 (four) times daily., Disp: 100 g, Rfl: 0 .  glucose blood (ACCU-CHEK AVIVA PLUS) test strip, 1 strip by Does not apply  route daily., Disp: , Rfl:  .  Multiple Vitamin (MULTI-VITAMINS) TABS, Take by mouth. Reported on 12/18/2015, Disp: , Rfl:  .  oxyCODONE-acetaminophen (PERCOCET) 10-325 MG tablet, Take 1 tablet by mouth 2 (two) times daily as needed for pain., Disp: 60 tablet, Rfl: 0 .  pantoprazole (PROTONIX) 40 MG tablet, TAKE ONE TABLET TWICE DAILY, Disp: 180 tablet, Rfl: 1 .  tiZANidine (ZANAFLEX) 2 MG tablet, Take 1 tablet (2 mg total) by mouth every 8 (eight) hours as needed for muscle spasms., Disp: 90 tablet, Rfl: 0  Current Facility-Administered Medications:  .  fentaNYL (SUBLIMAZE) injection 100 mcg, 100 mcg, Intravenous, Once, Molli Barrows, MD .  iopamidol (ISOVUE-M) 41 % intrathecal injection 20 mL, 20 mL, Other, Once PRN, Molli Barrows, MD .  iopamidol  (ISOVUE-M) 41 % intrathecal injection 20 mL, 20 mL, Other, Once PRN, Molli Barrows, MD .  lactated ringers infusion 1,000 mL, 1,000 mL, Intravenous, Continuous, Molli Barrows, MD .  lactated ringers infusion 1,000 mL, 1,000 mL, Intravenous, Continuous, Molli Barrows, MD .  midazolam (VERSED) 5 MG/5ML injection 5 mg, 5 mg, Intravenous, Once, Molli Barrows, MD .  midazolam (VERSED) injection 5 mg, 5 mg, Intravenous, Once, Molli Barrows, MD .  ropivacaine (PF) 2 mg/ml (0.2%) (NAROPIN) epidural 10 mL, 10 mL, Epidural, Once, Molli Barrows, MD .  triamcinolone acetonide (KENALOG-40) injection 40 mg, 40 mg, Other, Once, Molli Barrows, MD  Allergies  Allergen Reactions  . 2,4-D Dimethylamine (Amisol) Swelling  . Bee Venom Swelling  . Nsaids Other (See Comments)    Cannot take due to abd surgery  . Other Hives and Other (See Comments)    Cannot take due to abd surgery Cannot take due to abd surgery  . Tetanus Toxoid Adsorbed Other (See Comments)    Swelling, hot  . Tetanus Toxoids     Swelling, hot     Review of Systems  Constitutional: Positive for irritability.  HENT: Negative for ear pain.   Respiratory: Positive for shortness of breath (with panic attacks.).   Gastrointestinal: Positive for nausea.  Musculoskeletal: Positive for back pain.  Neurological: Negative for tingling and numbness.  Psychiatric/Behavioral: The patient is nervous/anxious.       Objective  Vitals:   09/04/16 0842  BP: 110/60  Pulse: 75  Resp: 16  Temp: 98.1 F (36.7 C)  TempSrc: Oral  SpO2: 96%  Weight: 190 lb 8 oz (86.4 kg)  Height: 5\' 10"  (1.778 m)    Physical Exam  Constitutional: She is well-developed, well-nourished, and in no distress.  HENT:  Mouth/Throat: Posterior oropharyngeal erythema present. No oropharyngeal exudate or posterior oropharyngeal edema.  Nasal turbinate hypertrophy,   Neck: Neck supple.  Cardiovascular: Normal rate, regular rhythm, S1 normal, S2 normal and normal  heart sounds.   No murmur heard. Pulmonary/Chest: Effort normal and breath sounds normal. She has no wheezes. She has no rhonchi.  Psychiatric: Mood, memory, affect and judgment normal.  Nursing note and vitals reviewed.     Recent Results (from the past 2160 hour(s))  POCT rapid strep A     Status: None   Collection Time: 09/04/16  8:50 AM  Result Value Ref Range   Rapid Strep A Screen Negative Negative     Assessment & Plan  1. Sore throat Point-of-care rapid strep test is negative - POCT rapid strep A  2. Chronic right-sided low back pain without sciatica Patient compliant with controlled substances agreement, pain responsive to opioid  therapy, refills provided - oxyCODONE-acetaminophen (PERCOCET) 10-325 MG tablet; Take 1 tablet by mouth 2 (two) times daily as needed for pain.  Dispense: 60 tablet; Refill: 0  3. Panic disorder with agoraphobia and moderate panic attacks Symptoms of anxiety are responsive to alprazolam taken twice daily as needed, refills provided. - ALPRAZolam (XANAX) 1 MG tablet; Take 1 tablet (1 mg total) by mouth 2 (two) times daily as needed for anxiety.  Dispense: 60 tablet; Refill: 0  4. S/P gastric bypass  - VITAMIN D 25 Hydroxy (Vit-D Deficiency, Fractures) - B12 - COMPLETE METABOLIC PANEL WITH GFR - Lipid panel   Alok Minshall [facility] 09/04/2016 8:58 AM

## 2016-09-05 LAB — VITAMIN B12: Vitamin B-12: 512 pg/mL (ref 200–1100)

## 2016-09-05 LAB — VITAMIN D 25 HYDROXY (VIT D DEFICIENCY, FRACTURES): VIT D 25 HYDROXY: 17 ng/mL — AB (ref 30–100)

## 2016-09-06 ENCOUNTER — Telehealth: Payer: Self-pay

## 2016-09-06 MED ORDER — VITAMIN D (ERGOCALCIFEROL) 1.25 MG (50000 UNIT) PO CAPS: 50000.0000 [IU] | ORAL_CAPSULE | ORAL | 0 refills | Status: DC

## 2016-09-06 NOTE — Telephone Encounter (Signed)
Patient has been notified of lab results and a prescription for vitamin D3 50,000 units take 1 capsule once a week x12 week has been sent to [place] per Dr. Manuella Ghazi, patient has been notified and verbalized understanding

## 2016-10-03 ENCOUNTER — Ambulatory Visit (INDEPENDENT_AMBULATORY_CARE_PROVIDER_SITE_OTHER): Payer: BLUE CROSS/BLUE SHIELD | Admitting: Family Medicine

## 2016-10-03 ENCOUNTER — Encounter: Payer: Self-pay | Admitting: Family Medicine

## 2016-10-03 DIAGNOSIS — M545 Low back pain: Secondary | ICD-10-CM | POA: Diagnosis not present

## 2016-10-03 DIAGNOSIS — G8929 Other chronic pain: Secondary | ICD-10-CM | POA: Diagnosis not present

## 2016-10-03 DIAGNOSIS — F4001 Agoraphobia with panic disorder: Secondary | ICD-10-CM | POA: Diagnosis not present

## 2016-10-03 MED ORDER — OXYCODONE-ACETAMINOPHEN 10-325 MG PO TABS: 1.0000 | ORAL_TABLET | Freq: Two times a day (BID) | ORAL | 0 refills | Status: DC | PRN

## 2016-10-03 MED ORDER — ALPRAZOLAM 1 MG PO TABS: 1.0000 mg | ORAL_TABLET | Freq: Two times a day (BID) | ORAL | 0 refills | Status: DC | PRN

## 2016-10-03 MED ORDER — CITALOPRAM HYDROBROMIDE 10 MG PO TABS: 10.0000 mg | ORAL_TABLET | Freq: Every day | ORAL | 0 refills | Status: DC

## 2016-10-03 NOTE — Progress Notes (Signed)
Name: Kiara Le   MRN: [id number]    DOB: 1976/02/22   Date:10/03/2016       Progress Note  Subjective  Chief Complaint  Chief Complaint  Patient presents with  . Follow-up    1 mo  . Medication Refill    Anxiety  Presents for follow-up visit. Symptoms include depressed mood, excessive worry, irritability, nausea, nervous/anxious behavior, panic and shortness of breath (with panic attacks.). Patient reports no feeling of choking. Primary symptoms comment: more anxiety attacks, has been having shakes, gagging . The severity of symptoms is moderate and causing significant distress. The quality of sleep is good.    Back Pain  This is a chronic problem. The problem occurs constantly. The problem is unchanged. The pain is present in the lumbar spine and gluteal. The pain radiates to the right thigh (right groin). The pain is at a severity of 5/10. The pain is moderate. The symptoms are aggravated by position and bending (worse with work and other household activities, ). Pertinent negatives include no leg pain, numbness, paresis or tingling. She has tried analgesics for the symptoms.   Patient had labs drawn last month which came back with below normal serum calcium at 8.4 mg/dL, we will order ionized calcium and intact PTH.   Past Medical History:  Diagnosis Date  . Anxiety   . Asthma   . Bipolar disorder ([place])   . Chronic fatigue   . Chronic low back pain   . Diabetes mellitus without complication ([place])   . Fatty liver disease, nonalcoholic   . History of Helicobacter pylori infection   . Hyperlipidemia   . Hypertension   . Insomnia   . Morbid obesity ([place])   . Panic disorder   . PTSD (post-traumatic stress disorder)   . Vitamin B12 deficiency   . Vitamin D deficiency     Past Surgical History:  Procedure Laterality Date  . ABDOMINAL HYSTERECTOMY    . GASTROPLASTY DUODENAL SWITCH    . PARTIAL HYSTERECTOMY    . SALPINGECTOMY    . TUBAL LIGATION      Family History    Problem Relation Age of Onset  . Heart disease Father   . Hypertension Father   . Hyperlipidemia Father   . Drug abuse Mother   . Anxiety disorder Sister   . Depression Sister     Social History   Social History  . Marital status: Married    Spouse name: Michiel Cowboy  . Number of children: 2  . Years of education: N/A   Occupational History  . Customer Service    Social History Main Topics  . Smoking status: Never Smoker  . Smokeless tobacco: Never Used  . Alcohol use No  . Drug use: No  . Sexual activity: Yes    Partners: Male    Birth control/ protection: None   Other Topics Concern  . Not on file   Social History Narrative  . No narrative on file     Current Outpatient Prescriptions:  .  ALPRAZolam (XANAX) 1 MG tablet, Take 1 tablet (1 mg total) by mouth 2 (two) times daily as needed for anxiety., Disp: 60 tablet, Rfl: 0 .  diclofenac sodium (VOLTAREN) 1 % GEL, Apply 4 g topically 4 (four) times daily., Disp: 100 g, Rfl: 0 .  glucose blood (ACCU-CHEK AVIVA PLUS) test strip, 1 strip by Does not apply route daily., Disp: , Rfl:  .  Multiple Vitamin (MULTI-VITAMINS) TABS, Take by mouth. Reported on  12/18/2015, Disp: , Rfl:  .  oxyCODONE-acetaminophen (PERCOCET) 10-325 MG tablet, Take 1 tablet by mouth 2 (two) times daily as needed for pain., Disp: 60 tablet, Rfl: 0 .  pantoprazole (PROTONIX) 40 MG tablet, TAKE ONE TABLET TWICE DAILY, Disp: 180 tablet, Rfl: 1 .  tiZANidine (ZANAFLEX) 2 MG tablet, Take 1 tablet (2 mg total) by mouth every 8 (eight) hours as needed for muscle spasms., Disp: 90 tablet, Rfl: 0 .  Vitamin D, Ergocalciferol, (DRISDOL) 50000 units CAPS capsule, Take 1 capsule (50,000 Units total) by mouth once a week., Disp: 12 capsule, Rfl: 0  Current Facility-Administered Medications:  .  fentaNYL (SUBLIMAZE) injection 100 mcg, 100 mcg, Intravenous, Once, Molli Barrows, MD .  iopamidol (ISOVUE-M) 41 % intrathecal injection 20 mL, 20 mL, Other, Once PRN, Molli Barrows, MD .  iopamidol (ISOVUE-M) 41 % intrathecal injection 20 mL, 20 mL, Other, Once PRN, Molli Barrows, MD .  lactated ringers infusion 1,000 mL, 1,000 mL, Intravenous, Continuous, Molli Barrows, MD .  lactated ringers infusion 1,000 mL, 1,000 mL, Intravenous, Continuous, Molli Barrows, MD .  midazolam (VERSED) 5 MG/5ML injection 5 mg, 5 mg, Intravenous, Once, Molli Barrows, MD .  midazolam (VERSED) injection 5 mg, 5 mg, Intravenous, Once, Molli Barrows, MD .  ropivacaine (PF) 2 mg/ml (0.2%) (NAROPIN) epidural 10 mL, 10 mL, Epidural, Once, Molli Barrows, MD .  triamcinolone acetonide (KENALOG-40) injection 40 mg, 40 mg, Other, Once, Molli Barrows, MD  Allergies  Allergen Reactions  . 2,4-D Dimethylamine (Amisol) Swelling  . Bee Venom Swelling  . Nsaids Other (See Comments)    Cannot take due to abd surgery  . Other Hives and Other (See Comments)    Cannot take due to abd surgery Cannot take due to abd surgery  . Tetanus Toxoid Adsorbed Other (See Comments)    Swelling, hot  . Tetanus Toxoids     Swelling, hot     Review of Systems  Constitutional: Positive for irritability.  Respiratory: Positive for shortness of breath (with panic attacks.).   Gastrointestinal: Positive for nausea.  Musculoskeletal: Positive for back pain.  Neurological: Negative for tingling and numbness.  Psychiatric/Behavioral: The patient is nervous/anxious.      Objective  Vitals:   10/03/16 0853  BP: 112/70  Pulse: 79  Resp: 16  Temp: 98.1 F (36.7 C)  TempSrc: Oral  SpO2: 96%  Weight: 190 lb 8 oz (86.4 kg)  Height: 5\' 10"  (1.778 m)    Physical Exam  Constitutional: She is oriented to person, place, and time and well-developed, well-nourished, and in no distress.  HENT:  Head: Normocephalic and atraumatic.  Cardiovascular: Normal rate, regular rhythm and normal heart sounds.   No murmur heard. Pulmonary/Chest: Effort normal and breath sounds normal. She has no wheezes.    Musculoskeletal: She exhibits no edema.       Lumbar back: She exhibits tenderness and pain.       Back:  Neurological: She is alert and oriented to person, place, and time.  Psychiatric: Mood, memory, affect and judgment normal.  Nursing note and vitals reviewed.    Assessment & Plan  .1. Panic disorder with agoraphobia and moderate panic attacks Stable, overall responsive to alprazolam, because of worsening anxiety we'll add citalopram 10 mg daily, advised to follow-up in one month - ALPRAZolam (XANAX) 1 MG tablet; Take 1 tablet (1 mg total) by mouth 2 (two) times daily as needed for anxiety.  Dispense: 60  tablet; Refill: 0 - citalopram (CELEXA) 10 MG tablet; Take 1 tablet (10 mg total) by mouth daily.  Dispense: 90 tablet; Refill: 0  2. Chronic right-sided low back pain without sciatica Pain responsive to Percocet taken twice daily as needed, patient compliant with controlled substances agreement and understands the dependence potential, side effects and drug interactions of opioids, especially with benzodiazepines. Refills provided - oxyCODONE-acetaminophen (PERCOCET) 10-325 MG tablet; Take 1 tablet by mouth 2 (two) times daily as needed for pain.  Dispense: 60 tablet; Refill: 0  3. Hypocalcemia Obtain workup for hypocalcemia. - COMPLETE METABOLIC PANEL WITH GFR - Calcium, ionized - PTH, Intact and Calcium   Paulina Muchmore [facility] 10/03/2016 9:00 AM

## 2016-10-04 LAB — COMPLETE METABOLIC PANEL WITH GFR
ALBUMIN: 4 g/dL (ref 3.6–5.1)
ALK PHOS: 66 U/L (ref 33–115)
ALT: 24 U/L (ref 6–29)
AST: 19 U/L (ref 10–30)
BILIRUBIN TOTAL: 0.3 mg/dL (ref 0.2–1.2)
BUN: 8 mg/dL (ref 7–25)
CO2: 26 mmol/L (ref 20–31)
CREATININE: 0.64 mg/dL (ref 0.50–1.10)
Calcium: 8.4 mg/dL — ABNORMAL LOW (ref 8.6–10.2)
Chloride: 108 mmol/L (ref 98–110)
Glucose, Bld: 84 mg/dL (ref 65–99)
Potassium: 4.5 mmol/L (ref 3.5–5.3)
Sodium: 139 mmol/L (ref 135–146)
TOTAL PROTEIN: 6.6 g/dL (ref 6.1–8.1)

## 2016-10-04 LAB — PTH, INTACT AND CALCIUM
CALCIUM: 8.4 mg/dL — AB (ref 8.6–10.2)
PTH: 55 pg/mL (ref 14–64)

## 2016-10-04 LAB — CALCIUM, IONIZED: Calcium, Ion: 4.9 mg/dL (ref 4.8–5.6)

## 2016-10-08 ENCOUNTER — Other Ambulatory Visit: Payer: Self-pay | Admitting: Family Medicine

## 2016-10-08 DIAGNOSIS — E209 Hypoparathyroidism, unspecified: Secondary | ICD-10-CM

## 2016-10-16 DIAGNOSIS — R5383 Other fatigue: Secondary | ICD-10-CM | POA: Diagnosis not present

## 2016-10-16 DIAGNOSIS — R531 Weakness: Secondary | ICD-10-CM | POA: Diagnosis not present

## 2016-10-16 DIAGNOSIS — F419 Anxiety disorder, unspecified: Secondary | ICD-10-CM | POA: Diagnosis not present

## 2016-10-16 DIAGNOSIS — F329 Major depressive disorder, single episode, unspecified: Secondary | ICD-10-CM | POA: Diagnosis not present

## 2016-10-16 DIAGNOSIS — R51 Headache: Secondary | ICD-10-CM | POA: Diagnosis not present

## 2016-10-16 DIAGNOSIS — R42 Dizziness and giddiness: Secondary | ICD-10-CM | POA: Diagnosis not present

## 2016-10-16 DIAGNOSIS — K219 Gastro-esophageal reflux disease without esophagitis: Secondary | ICD-10-CM | POA: Diagnosis not present

## 2016-11-01 ENCOUNTER — Encounter: Payer: Self-pay | Admitting: Family Medicine

## 2016-11-01 ENCOUNTER — Ambulatory Visit (INDEPENDENT_AMBULATORY_CARE_PROVIDER_SITE_OTHER): Payer: BLUE CROSS/BLUE SHIELD | Admitting: Family Medicine

## 2016-11-01 DIAGNOSIS — M545 Low back pain, unspecified: Secondary | ICD-10-CM

## 2016-11-01 DIAGNOSIS — G8929 Other chronic pain: Secondary | ICD-10-CM

## 2016-11-01 DIAGNOSIS — F4001 Agoraphobia with panic disorder: Secondary | ICD-10-CM

## 2016-11-01 MED ORDER — OXYCODONE-ACETAMINOPHEN 10-325 MG PO TABS: 1.0000 | ORAL_TABLET | Freq: Three times a day (TID) | ORAL | 0 refills | Status: DC | PRN

## 2016-11-01 MED ORDER — ALPRAZOLAM 1 MG PO TABS: 1.0000 mg | ORAL_TABLET | Freq: Two times a day (BID) | ORAL | 0 refills | Status: DC | PRN

## 2016-11-01 NOTE — Progress Notes (Signed)
Name: STEPHANE JUNKINS   MRN: [id number]    DOB: Dec 26, 1975   Date:11/01/2016       Progress Note  Subjective  Chief Complaint  Chief Complaint  Patient presents with  . Medication Refill    1 month F/U  . Back Pain    Needs refill   . Anxiety    Back Pain  This is a chronic problem. The problem has been gradually worsening since onset. The pain is present in the lumbar spine. The quality of the pain is described as aching. The pain does not radiate. The pain is at a severity of 6/10. The symptoms are aggravated by bending, position and sitting (worse with taking care of her grandmother (having to help with shower, toliet, and dressing needs)). Pertinent negatives include no bladder incontinence or bowel incontinence. She has tried analgesics for the symptoms.  Anxiety  Presents for follow-up visit. Symptoms include depressed mood, excessive worry, feeling of choking, insomnia, irritability, nervous/anxious behavior, panic and shortness of breath. The severity of symptoms is moderate and causing significant distress. The quality of sleep is fair.      Past Medical History:  Diagnosis Date  . Anxiety   . Asthma   . Bipolar disorder ([place])   . Chronic fatigue   . Chronic low back pain   . Diabetes mellitus without complication ([place])   . Fatty liver disease, nonalcoholic   . History of Helicobacter pylori infection   . Hyperlipidemia   . Hypertension   . Insomnia   . Morbid obesity ([place])   . Panic disorder   . PTSD (post-traumatic stress disorder)   . Vitamin B12 deficiency   . Vitamin D deficiency     Past Surgical History:  Procedure Laterality Date  . ABDOMINAL HYSTERECTOMY    . GASTROPLASTY DUODENAL SWITCH    . PARTIAL HYSTERECTOMY    . SALPINGECTOMY    . TUBAL LIGATION      Family History  Problem Relation Age of Onset  . Heart disease Father   . Hypertension Father   . Hyperlipidemia Father   . Drug abuse Mother   . Anxiety disorder Sister   . Depression  Sister     Social History   Social History  . Marital status: Married    Spouse name: Michiel Cowboy  . Number of children: 2  . Years of education: N/A   Occupational History  . Customer Service    Social History Main Topics  . Smoking status: Never Smoker  . Smokeless tobacco: Never Used  . Alcohol use No  . Drug use: No  . Sexual activity: Yes    Partners: Male    Birth control/ protection: None   Other Topics Concern  . Not on file   Social History Narrative  . No narrative on file     Current Outpatient Prescriptions:  .  ALPRAZolam (XANAX) 1 MG tablet, Take 1 tablet (1 mg total) by mouth 2 (two) times daily as needed for anxiety., Disp: 60 tablet, Rfl: 0 .  citalopram (CELEXA) 10 MG tablet, Take 1 tablet (10 mg total) by mouth daily., Disp: 90 tablet, Rfl: 0 .  glucose blood (ACCU-CHEK AVIVA PLUS) test strip, 1 strip by Does not apply route daily., Disp: , Rfl:  .  Multiple Vitamin (MULTI-VITAMINS) TABS, Take by mouth. Reported on 12/18/2015, Disp: , Rfl:  .  oxyCODONE-acetaminophen (PERCOCET) 10-325 MG tablet, Take 1 tablet by mouth 2 (two) times daily as needed for pain., Disp: 60  tablet, Rfl: 0 .  pantoprazole (PROTONIX) 40 MG tablet, TAKE ONE TABLET TWICE DAILY, Disp: 180 tablet, Rfl: 1 .  tiZANidine (ZANAFLEX) 2 MG tablet, Take 1 tablet (2 mg total) by mouth every 8 (eight) hours as needed for muscle spasms., Disp: 90 tablet, Rfl: 0 .  Vitamin D, Ergocalciferol, (DRISDOL) 50000 units CAPS capsule, Take 1 capsule (50,000 Units total) by mouth once a week., Disp: 12 capsule, Rfl: 0 .  diclofenac sodium (VOLTAREN) 1 % GEL, Apply 4 g topically 4 (four) times daily. (Patient not taking: Reported on 11/01/2016), Disp: 100 g, Rfl: 0  Current Facility-Administered Medications:  .  fentaNYL (SUBLIMAZE) injection 100 mcg, 100 mcg, Intravenous, Once, Molli Barrows, MD .  iopamidol (ISOVUE-M) 41 % intrathecal injection 20 mL, 20 mL, Other, Once PRN, Molli Barrows, MD .  iopamidol  (ISOVUE-M) 41 % intrathecal injection 20 mL, 20 mL, Other, Once PRN, Molli Barrows, MD .  lactated ringers infusion 1,000 mL, 1,000 mL, Intravenous, Continuous, Adams, Alvina Filbert, MD .  lactated ringers infusion 1,000 mL, 1,000 mL, Intravenous, Continuous, Adams, Alvina Filbert, MD .  midazolam (VERSED) 5 MG/5ML injection 5 mg, 5 mg, Intravenous, Once, Molli Barrows, MD .  midazolam (VERSED) injection 5 mg, 5 mg, Intravenous, Once, Molli Barrows, MD .  ropivacaine (PF) 2 mg/ml (0.2%) (NAROPIN) epidural 10 mL, 10 mL, Epidural, Once, Molli Barrows, MD .  triamcinolone acetonide (KENALOG-40) injection 40 mg, 40 mg, Other, Once, Andree Elk Alvina Filbert, MD  Allergies  Allergen Reactions  . 2,4-D Dimethylamine (Amisol) Swelling  . Bee Venom Swelling  . Nsaids Other (See Comments)    Cannot take due to abd surgery  . Other Hives and Other (See Comments)    Cannot take due to abd surgery Cannot take due to abd surgery  . Tetanus Toxoid Adsorbed Other (See Comments)    Swelling, hot  . Tetanus Toxoids     Swelling, hot     Review of Systems  Constitutional: Positive for irritability.  Respiratory: Positive for shortness of breath.   Gastrointestinal: Negative for bowel incontinence.  Genitourinary: Negative for bladder incontinence.  Musculoskeletal: Positive for back pain.  Psychiatric/Behavioral: The patient is nervous/anxious and has insomnia.      Objective  Vitals:   11/01/16 0949  BP: 114/86  Pulse: 78  Resp: 16  Temp: 98 F (36.7 C)  TempSrc: Oral  SpO2: 98%  Weight: 188 lb 4.8 oz (85.4 kg)  Height: 5\' 10"  (1.778 m)    Physical Exam  Constitutional: She is oriented to person, place, and time and well-developed, well-nourished, and in no distress.  HENT:  Head: Normocephalic and atraumatic.  Cardiovascular: Normal rate, regular rhythm and normal heart sounds.   No murmur heard. Pulmonary/Chest: Effort normal and breath sounds normal. She has no wheezes.  Musculoskeletal:        Lumbar back: She exhibits tenderness, pain and spasm.       Back:  Neurological: She is alert and oriented to person, place, and time.  Psychiatric: Mood, memory, affect and judgment normal.  Nursing note and vitals reviewed.      Assessment & Plan 1. Chronic right-sided low back pain without sciatica Worse after activity and because of additional responsibility of taking care of her grandmother. Increase oxycodone to every 8 hours as needed, refills provided - oxyCODONE-acetaminophen (PERCOCET) 10-325 MG tablet; Take 1 tablet by mouth every 8 (eight) hours as needed for pain.  Dispense: 90 tablet; Refill: 0  2. Panic disorder with agoraphobia and moderate panic attacks Stable and unchanged, continued and alprazolam. Patient compliant with controlled substances agreement. - ALPRAZolam (XANAX) 1 MG tablet; Take 1 tablet (1 mg total) by mouth 2 (two) times daily as needed for anxiety.  Dispense: 60 tablet; Refill: 0    Youlanda Tomassetti [facility] 11/01/2016 9:59 AM

## 2016-11-21 DIAGNOSIS — R0789 Other chest pain: Secondary | ICD-10-CM | POA: Diagnosis not present

## 2016-11-21 DIAGNOSIS — F419 Anxiety disorder, unspecified: Secondary | ICD-10-CM | POA: Diagnosis not present

## 2016-11-21 DIAGNOSIS — R112 Nausea with vomiting, unspecified: Secondary | ICD-10-CM | POA: Diagnosis not present

## 2016-11-21 DIAGNOSIS — F411 Generalized anxiety disorder: Secondary | ICD-10-CM | POA: Diagnosis not present

## 2016-11-21 DIAGNOSIS — R197 Diarrhea, unspecified: Secondary | ICD-10-CM | POA: Diagnosis not present

## 2016-11-21 DIAGNOSIS — R079 Chest pain, unspecified: Secondary | ICD-10-CM | POA: Diagnosis not present

## 2016-11-29 ENCOUNTER — Ambulatory Visit (INDEPENDENT_AMBULATORY_CARE_PROVIDER_SITE_OTHER): Payer: BLUE CROSS/BLUE SHIELD | Admitting: Family Medicine

## 2016-11-29 ENCOUNTER — Encounter: Payer: Self-pay | Admitting: Family Medicine

## 2016-11-29 VITALS — BP 116/77 | HR 78 | Temp 98.4°F | Resp 16 | Ht 70.0 in | Wt 191.1 lb

## 2016-11-29 DIAGNOSIS — G8929 Other chronic pain: Secondary | ICD-10-CM | POA: Diagnosis not present

## 2016-11-29 DIAGNOSIS — M545 Low back pain, unspecified: Secondary | ICD-10-CM

## 2016-11-29 DIAGNOSIS — R0789 Other chest pain: Secondary | ICD-10-CM

## 2016-11-29 DIAGNOSIS — F4001 Agoraphobia with panic disorder: Secondary | ICD-10-CM

## 2016-11-29 MED ORDER — ALPRAZOLAM 1 MG PO TABS: 1.0000 mg | ORAL_TABLET | Freq: Two times a day (BID) | ORAL | 0 refills | Status: DC | PRN

## 2016-11-29 MED ORDER — OXYCODONE-ACETAMINOPHEN 10-325 MG PO TABS: 1.0000 | ORAL_TABLET | Freq: Three times a day (TID) | ORAL | 0 refills | Status: DC | PRN

## 2016-11-29 NOTE — Progress Notes (Signed)
Name: Kiara Le   MRN: [id number]    DOB: May 15, 1976   Date:11/29/2016       Progress Note  Subjective  Chief Complaint  Chief Complaint  Patient presents with  . Follow-up    1 mo  . Medication Refill    Anxiety  Presents for follow-up visit. Symptoms include depressed mood, excessive worry, irritability, nausea, nervous/anxious behavior, panic and shortness of breath (with panic attacks.). Patient reports no feeling of choking. The severity of symptoms is moderate and causing significant distress (improved overall, presented to [facility] ER for chest pain after episode of prolonged diarrhea. ). The quality of sleep is good.    Back Pain  This is a chronic problem. The problem occurs constantly. The problem is unchanged. The pain is present in the lumbar spine and gluteal. The pain radiates to the right thigh (right groin). The pain is at a severity of 5/10. 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 Pertinent negatives include no leg pain, numbness, paresis or tingling. She has tried analgesics for the symptoms.     Past Medical History:  Diagnosis Date  . Anxiety   . Asthma   . Bipolar disorder ([place])   . Chronic fatigue   . Chronic low back pain   . Diabetes mellitus without complication ([place])   . Fatty liver disease, nonalcoholic   . History of Helicobacter pylori infection   . Hyperlipidemia   . Hypertension   . Insomnia   . Morbid obesity ([place])   . Panic disorder   . PTSD (post-traumatic stress disorder)   . Vitamin B12 deficiency   . Vitamin D deficiency     Past Surgical History:  Procedure Laterality Date  . ABDOMINAL HYSTERECTOMY    . GASTROPLASTY DUODENAL SWITCH    . PARTIAL HYSTERECTOMY    . SALPINGECTOMY    . TUBAL LIGATION      Family History  Problem Relation Age of Onset  . Heart disease Father   . Hypertension Father   .  Hyperlipidemia Father   . Drug abuse Mother   . Anxiety disorder Sister   . Depression Sister     Social History   Social History  . Marital status: Married    Spouse name: Michiel Cowboy  . Number of children: 2  . Years of education: N/A   Occupational History  . Customer Service    Social History Main Topics  . Smoking status: Never Smoker  . Smokeless tobacco: Never Used  . Alcohol use No  . Drug use: No  . Sexual activity: Yes    Partners: Male    Birth control/ protection: None   Other Topics Concern  . Not on file   Social History Narrative  . No narrative on file     Current Outpatient Prescriptions:  .  ALPRAZolam (XANAX) 1 MG tablet, Take 1 tablet (1 mg total) by mouth 2 (two) times daily as needed for anxiety., Disp: 60 tablet, Rfl: 0 .  citalopram (CELEXA) 10 MG tablet, Take 1 tablet (10 mg total) by mouth daily., Disp: 90 tablet, Rfl: 0 .  diclofenac sodium (VOLTAREN) 1 % GEL, Apply 4 g topically 4 (four) times daily., Disp: 100 g, Rfl: 0 .  glucose blood (ACCU-CHEK AVIVA PLUS) test strip, 1 strip by Does not apply route daily., Disp: , Rfl:  .  Multiple Vitamin (MULTI-VITAMINS) TABS, Take  by mouth. Reported on 12/18/2015, Disp: , Rfl:  .  oxyCODONE-acetaminophen (PERCOCET) 10-325 MG tablet, Take 1 tablet by mouth every 8 (eight) hours as needed for pain., Disp: 90 tablet, Rfl: 0 .  pantoprazole (PROTONIX) 40 MG tablet, TAKE ONE TABLET TWICE DAILY, Disp: 180 tablet, Rfl: 1 .  tiZANidine (ZANAFLEX) 2 MG tablet, Take 1 tablet (2 mg total) by mouth every 8 (eight) hours as needed for muscle spasms., Disp: 90 tablet, Rfl: 0 .  Vitamin D, Ergocalciferol, (DRISDOL) 50000 units CAPS capsule, Take 1 capsule (50,000 Units total) by mouth once a week. (Patient not taking: Reported on 11/29/2016), Disp: 12 capsule, Rfl: 0  Current Facility-Administered Medications:  .  fentaNYL (SUBLIMAZE) injection 100 mcg, 100 mcg, Intravenous, Once, Molli Barrows, MD .  iopamidol (ISOVUE-M)  41 % intrathecal injection 20 mL, 20 mL, Other, Once PRN, Molli Barrows, MD .  iopamidol (ISOVUE-M) 41 % intrathecal injection 20 mL, 20 mL, Other, Once PRN, Molli Barrows, MD .  lactated ringers infusion 1,000 mL, 1,000 mL, Intravenous, Continuous, Adams, Alvina Filbert, MD .  lactated ringers infusion 1,000 mL, 1,000 mL, Intravenous, Continuous, Adams, Alvina Filbert, MD .  midazolam (VERSED) 5 MG/5ML injection 5 mg, 5 mg, Intravenous, Once, Molli Barrows, MD .  midazolam (VERSED) injection 5 mg, 5 mg, Intravenous, Once, Molli Barrows, MD .  ropivacaine (PF) 2 mg/ml (0.2%) (NAROPIN) epidural 10 mL, 10 mL, Epidural, Once, Molli Barrows, MD .  triamcinolone acetonide (KENALOG-40) injection 40 mg, 40 mg, Other, Once, Andree Elk Alvina Filbert, MD  Allergies  Allergen Reactions  . 2,4-D Dimethylamine (Amisol) Swelling  . Bee Venom Swelling  . Nsaids Other (See Comments)    Cannot take due to abd surgery  . Other Hives and Other (See Comments)    Cannot take due to abd surgery Cannot take due to abd surgery  . Tetanus Toxoid Adsorbed Other (See Comments)    Swelling, hot  . Tetanus Toxoids     Swelling, hot     Review of Systems  Constitutional: Positive for irritability.  Respiratory: Positive for shortness of breath (with panic attacks.).   Gastrointestinal: Positive for nausea.  Musculoskeletal: Positive for back pain.  Neurological: Negative for tingling and numbness.  Psychiatric/Behavioral: The patient is nervous/anxious.      Objective  Vitals:   11/29/16 1051  BP: 116/77  Pulse: 78  Resp: 16  Temp: 98.4 F (36.9 C)  TempSrc: Oral  SpO2: 97%  Weight: 191 lb 1.6 oz (86.7 kg)  Height: 5\' 10"  (1.778 m)    Physical Exam  Constitutional: She is oriented to person, place, and time and well-developed, well-nourished, and in no distress.  HENT:  Head: Normocephalic and atraumatic.  Cardiovascular: Normal rate, regular rhythm and normal heart sounds.   No murmur heard. Pulmonary/Chest:  Effort normal and breath sounds normal. She has no wheezes.  Musculoskeletal:       Lumbar back: She exhibits tenderness, pain and spasm.       Back:  Neurological: She is alert and oriented to person, place, and time.  Psychiatric: Mood, memory, affect and judgment normal.  Nursing note and vitals reviewed.       Assessment & Plan  1. Chronic right-sided low back pain without sciatica Stable and responsive to opioids, patient compliant with controlled substances agreement and understands the dependence potential, side effects and drug interactions of opioids, refills provided - oxyCODONE-acetaminophen (PERCOCET) 10-325 MG tablet; Take 1 tablet by mouth every 8 (  eight) hours as needed for pain.  Dispense: 90 tablet; Refill: 0  2. Panic disorder with agoraphobia and moderate panic attacks Stable and improved, continue on alprazolam as prescribed. Patient aware of the interaction between benzodiazepines and opioids and is compliant with controlled substances agreement - ALPRAZolam (XANAX) 1 MG tablet; Take 1 tablet (1 mg total) by mouth 2 (two) times daily as needed for anxiety.  Dispense: 60 tablet; Refill: 0  3. Gassy chest pain Reviewed ER notes and recommendations from [facility], patient reports diarrhea and chest pain has now resolved. Reassured and no further workup is necessary at this point  [place] 11/29/2016 11:07 AM

## 2016-12-19 ENCOUNTER — Encounter: Payer: Self-pay | Admitting: Family Medicine

## 2016-12-27 ENCOUNTER — Encounter: Payer: Self-pay | Admitting: Family Medicine

## 2016-12-27 ENCOUNTER — Ambulatory Visit (INDEPENDENT_AMBULATORY_CARE_PROVIDER_SITE_OTHER): Payer: BLUE CROSS/BLUE SHIELD | Admitting: Family Medicine

## 2016-12-27 DIAGNOSIS — F4001 Agoraphobia with panic disorder: Secondary | ICD-10-CM | POA: Diagnosis not present

## 2016-12-27 DIAGNOSIS — G8929 Other chronic pain: Secondary | ICD-10-CM

## 2016-12-27 DIAGNOSIS — M545 Low back pain: Secondary | ICD-10-CM

## 2016-12-27 MED ORDER — OXYCODONE-ACETAMINOPHEN 10-325 MG PO TABS: 1.0000 | ORAL_TABLET | Freq: Three times a day (TID) | ORAL | 0 refills | Status: AC | PRN

## 2016-12-27 MED ORDER — CITALOPRAM HYDROBROMIDE 20 MG PO TABS: 20.0000 mg | ORAL_TABLET | Freq: Every day | ORAL | 0 refills | Status: DC

## 2016-12-27 MED ORDER — ALPRAZOLAM 1 MG PO TABS: 1.0000 mg | ORAL_TABLET | Freq: Two times a day (BID) | ORAL | 0 refills | Status: DC | PRN

## 2016-12-27 NOTE — Progress Notes (Signed)
Name: Kiara Le   MRN: [id number]    DOB: Jan 01, 1976   Date:12/27/2016       Progress Note  Subjective  Chief Complaint  Chief Complaint  Patient presents with  . Follow-up    3 mo  . Medication Refill   Anxiety  Presents for follow-up visit. Symptoms include depressed mood, excessive worry, irritability, nausea, nervous/anxious behavior, panic and shortness of breath (with panic attacks.). Patient reports no feeling of choking. Primary symptoms comment: clenching her teeth at night, gagging spells. The severity of symptoms is moderate and causing significant distress. The quality of sleep is good.    Back Pain  This is a chronic problem. The problem occurs constantly. The problem is unchanged. The pain is present in the lumbar spine and gluteal. The pain radiates to the right thigh (right groin). The pain is at a severity of 4/10. The pain is moderate. The symptoms are aggravated by position and bending (worse with work and other household activities, walking at the [employer] makes it worse). Pertinent negatives include no leg pain, numbness, paresis or tingling. She has tried analgesics for the symptoms.     Past Medical History:  Diagnosis Date  . Anxiety   . Asthma   . Bipolar disorder ([place])   . Chronic fatigue   . Chronic low back pain   . Diabetes mellitus without complication ([place])   . Fatty liver disease, nonalcoholic   . History of Helicobacter pylori infection   . Hyperlipidemia   . Hypertension   . Insomnia   . Morbid obesity ([place])   . Panic disorder   . PTSD (post-traumatic stress disorder)   . Vitamin B12 deficiency   . Vitamin D deficiency     Past Surgical History:  Procedure Laterality Date  . ABDOMINAL HYSTERECTOMY    . GASTROPLASTY DUODENAL SWITCH    . PARTIAL HYSTERECTOMY    . SALPINGECTOMY    . TUBAL LIGATION      Family History  Problem Relation Age of Onset  . Heart disease Father   . Hypertension Father   . Hyperlipidemia Father     . Drug abuse Mother   . Anxiety disorder Sister   . Depression Sister     Social History   Social History  . Marital status: Married    Spouse name: Michiel Cowboy  . Number of children: 2  . Years of education: N/A   Occupational History  . Customer Service    Social History Main Topics  . Smoking status: Never Smoker  . Smokeless tobacco: Never Used  . Alcohol use No  . Drug use: No  . Sexual activity: Yes    Partners: Male    Birth control/ protection: None   Other Topics Concern  . Not on file   Social History Narrative  . No narrative on file     Current Outpatient Prescriptions:  .  ALPRAZolam (XANAX) 1 MG tablet, Take 1 tablet (1 mg total) by mouth 2 (two) times daily as needed for anxiety., Disp: 60 tablet, Rfl: 0 .  citalopram (CELEXA) 10 MG tablet, Take 1 tablet (10 mg total) by mouth daily., Disp: 90 tablet, Rfl: 0 .  diclofenac sodium (VOLTAREN) 1 % GEL, Apply 4 g topically 4 (four) times daily., Disp: 100 g, Rfl: 0 .  glucose blood (ACCU-CHEK AVIVA PLUS) test strip, 1 strip by Does not apply route daily., Disp: , Rfl:  .  Multiple Vitamin (MULTI-VITAMINS) TABS, Take by mouth. Reported on  12/18/2015, Disp: , Rfl:  .  oxyCODONE-acetaminophen (PERCOCET) 10-325 MG tablet, Take 1 tablet by mouth every 8 (eight) hours as needed for pain., Disp: 90 tablet, Rfl: 0 .  pantoprazole (PROTONIX) 40 MG tablet, TAKE ONE TABLET TWICE DAILY, Disp: 180 tablet, Rfl: 1 .  tiZANidine (ZANAFLEX) 2 MG tablet, Take 1 tablet (2 mg total) by mouth every 8 (eight) hours as needed for muscle spasms., Disp: 90 tablet, Rfl: 0  Current Facility-Administered Medications:  .  fentaNYL (SUBLIMAZE) injection 100 mcg, 100 mcg, Intravenous, Once, Molli Barrows, MD .  iopamidol (ISOVUE-M) 41 % intrathecal injection 20 mL, 20 mL, Other, Once PRN, Molli Barrows, MD .  iopamidol (ISOVUE-M) 41 % intrathecal injection 20 mL, 20 mL, Other, Once PRN, Molli Barrows, MD .  lactated ringers infusion 1,000  mL, 1,000 mL, Intravenous, Continuous, Adams, Alvina Filbert, MD .  lactated ringers infusion 1,000 mL, 1,000 mL, Intravenous, Continuous, Adams, Alvina Filbert, MD .  midazolam (VERSED) 5 MG/5ML injection 5 mg, 5 mg, Intravenous, Once, Molli Barrows, MD .  midazolam (VERSED) injection 5 mg, 5 mg, Intravenous, Once, Molli Barrows, MD .  ropivacaine (PF) 2 mg/ml (0.2%) (NAROPIN) epidural 10 mL, 10 mL, Epidural, Once, Molli Barrows, MD .  triamcinolone acetonide (KENALOG-40) injection 40 mg, 40 mg, Other, Once, Molli Barrows, MD  Allergies  Allergen Reactions  . 2,4-D Dimethylamine (Amisol) Swelling  . Bee Venom Swelling  . Nsaids Other (See Comments)    Cannot take due to abd surgery  . Other Hives and Other (See Comments)    Cannot take due to abd surgery Cannot take due to abd surgery  . Tetanus Toxoid Adsorbed Other (See Comments)    Swelling, hot  . Tetanus Toxoids     Swelling, hot     Review of Systems  Constitutional: Positive for irritability.  Respiratory: Positive for shortness of breath (with panic attacks.).   Gastrointestinal: Positive for nausea.  Musculoskeletal: Positive for back pain.  Neurological: Negative for tingling and numbness.  Psychiatric/Behavioral: The patient is nervous/anxious.      Objective  Vitals:   12/27/16 0922  BP: 118/73  Pulse: 82  Resp: 16  Temp: 98.3 F (36.8 C)  TempSrc: Oral  SpO2: 96%  Weight: 193 lb 1.6 oz (87.6 kg)  Height: 5\' 10"  (1.778 m)    Physical Exam  Constitutional: She is oriented to person, place, and time and well-developed, well-nourished, and in no distress.  HENT:  Head: Normocephalic and atraumatic.  Cardiovascular: Normal rate, regular rhythm and normal heart sounds.   No murmur heard. Pulmonary/Chest: Effort normal and breath sounds normal. She has no wheezes.  Musculoskeletal: She exhibits no edema.       Lumbar back: She exhibits tenderness and pain.       Back:  Neurological: She is alert and oriented to  person, place, and time.  Psychiatric: Mood, memory, affect and judgment normal.  Nursing note and vitals reviewed.     Assessment & Plan  1. Panic disorder with agoraphobia and moderate panic attacks Stable but given her recent panic attack, will increase citalopram to 20 mg, continue alprazolam 1 mg twice a day when necessary, patient compliant with the controlled substance agreement. Refills provided - citalopram (CELEXA) 20 MG tablet; Take 1 tablet (20 mg total) by mouth daily.  Dispense: 90 tablet; Refill: 0 - ALPRAZolam (XANAX) 1 MG tablet; Take 1 tablet (1 mg total) by mouth 2 (two) times daily as needed  for anxiety.  Dispense: 60 tablet; Refill: 0  2. Chronic right-sided low back pain without sciatica Stable, patient compliant with controlled substances agreement, refills for Percocet provided - oxyCODONE-acetaminophen (PERCOCET) 10-325 MG tablet; Take 1 tablet by mouth every 8 (eight) hours as needed for pain.  Dispense: 90 tablet; Refill: 0   Euline Kimbler [facility] 12/27/2016 9:29 AM

## 2017-01-07 ENCOUNTER — Encounter: Payer: Self-pay | Admitting: Family Medicine

## 2017-01-27 ENCOUNTER — Encounter: Payer: Self-pay | Admitting: Family Medicine

## 2017-01-27 ENCOUNTER — Ambulatory Visit (INDEPENDENT_AMBULATORY_CARE_PROVIDER_SITE_OTHER): Payer: BLUE CROSS/BLUE SHIELD | Admitting: Family Medicine

## 2017-01-27 VITALS — BP 118/77 | HR 73 | Temp 97.9°F | Resp 16 | Ht 70.0 in | Wt 191.1 lb

## 2017-01-27 DIAGNOSIS — Z23 Encounter for immunization: Secondary | ICD-10-CM

## 2017-01-27 DIAGNOSIS — M545 Low back pain, unspecified: Secondary | ICD-10-CM

## 2017-01-27 DIAGNOSIS — G8929 Other chronic pain: Secondary | ICD-10-CM

## 2017-01-27 DIAGNOSIS — M25561 Pain in right knee: Secondary | ICD-10-CM | POA: Diagnosis not present

## 2017-01-27 DIAGNOSIS — F4001 Agoraphobia with panic disorder: Secondary | ICD-10-CM

## 2017-01-27 MED ORDER — OXYCODONE-ACETAMINOPHEN 10-325 MG PO TABS: 1.0000 | ORAL_TABLET | Freq: Three times a day (TID) | ORAL | 0 refills | Status: AC | PRN

## 2017-01-27 MED ORDER — ALPRAZOLAM 1 MG PO TABS: 1.0000 mg | ORAL_TABLET | Freq: Two times a day (BID) | ORAL | 0 refills | Status: DC | PRN

## 2017-01-27 MED ORDER — VENLAFAXINE HCL ER 37.5 MG PO TB24: 1.0000 | ORAL_TABLET | Freq: Every day | ORAL | 2 refills | Status: DC

## 2017-01-27 MED ORDER — LIDOCAINE 5 % EX PTCH: 1.0000 | MEDICATED_PATCH | CUTANEOUS | 0 refills | Status: DC

## 2017-01-27 NOTE — Progress Notes (Signed)
Name: Kiara Le   MRN: [id number]    DOB: 10-17-1975   Date:01/27/2017       Progress Note  Subjective  Chief Complaint  Chief Complaint  Patient presents with  . Follow-up    1 mo  . Medication Refill    Anxiety  Presents for follow-up visit. Symptoms include chest pain, depressed mood, excessive worry, feeling of choking, hyperventilation, irritability, panic and shortness of breath. Primary symptoms comment: worsening panic attacks, taking care of her grandmother who has dementia,. Symptoms occur most days. The severity of symptoms is moderate and causing significant distress. The quality of sleep is fair.    Back Pain  This is a chronic problem. The problem is unchanged. The pain is present in the lumbar spine. The quality of the pain is described as aching. The pain does not radiate. The pain is at a severity of 3/10. The symptoms are aggravated by position. Associated symptoms include chest pain. Pertinent negatives include no bladder incontinence or bowel incontinence. She has tried analgesics for the symptoms.   Patient is also requesting a prescription for Lidoderm patch for her knees, she has bilateral knee pain, right side worse than left. Pain is worse with weight-bearing and activity. She has tried Voltaren gel in the past with good relief but insurance is no longer covering Voltaren. She reports that Lidoderm patch also helps with her back pain  Past Medical History:  Diagnosis Date  . Anxiety   . Asthma   . Bipolar disorder ([place])   . Chronic fatigue   . Chronic low back pain   . Diabetes mellitus without complication ([place])   . Fatty liver disease, nonalcoholic   . History of Helicobacter pylori infection   . Hyperlipidemia   . Hypertension   . Insomnia   . Morbid obesity ([place])   . Panic disorder   . PTSD (post-traumatic stress disorder)   . Vitamin B12 deficiency   . Vitamin D deficiency     Past Surgical History:  Procedure Laterality Date  . ABDOMINAL  HYSTERECTOMY    . GASTROPLASTY DUODENAL SWITCH    . PARTIAL HYSTERECTOMY    . SALPINGECTOMY    . TUBAL LIGATION      Family History  Problem Relation Age of Onset  . Heart disease Father   . Hypertension Father   . Hyperlipidemia Father   . Drug abuse Mother   . Anxiety disorder Sister   . Depression Sister     Social History   Social History  . Marital status: Married    Spouse name: Michiel Cowboy  . Number of children: 2  . Years of education: N/A   Occupational History  . Customer Service    Social History Main Topics  . Smoking status: Never Smoker  . Smokeless tobacco: Never Used  . Alcohol use No  . Drug use: No  . Sexual activity: Yes    Partners: Male    Birth control/ protection: None   Other Topics Concern  . Not on file   Social History Narrative  . No narrative on file     Current Outpatient Prescriptions:  .  ALPRAZolam (XANAX) 1 MG tablet, Take 1 tablet (1 mg total) by mouth 2 (two) times daily as needed for anxiety., Disp: 60 tablet, Rfl: 0 .  citalopram (CELEXA) 20 MG tablet, Take 1 tablet (20 mg total) by mouth daily., Disp: 90 tablet, Rfl: 0 .  glucose blood (ACCU-CHEK AVIVA PLUS) test strip, 1 strip by  Does not apply route daily., Disp: , Rfl:  .  pantoprazole (PROTONIX) 40 MG tablet, TAKE ONE TABLET TWICE DAILY, Disp: 180 tablet, Rfl: 1 .  tiZANidine (ZANAFLEX) 2 MG tablet, Take 1 tablet (2 mg total) by mouth every 8 (eight) hours as needed for muscle spasms. (Patient not taking: Reported on 01/27/2017), Disp: 90 tablet, Rfl: 0  Current Facility-Administered Medications:  .  fentaNYL (SUBLIMAZE) injection 100 mcg, 100 mcg, Intravenous, Once, Molli Barrows, MD .  iopamidol (ISOVUE-M) 41 % intrathecal injection 20 mL, 20 mL, Other, Once PRN, Molli Barrows, MD .  iopamidol (ISOVUE-M) 41 % intrathecal injection 20 mL, 20 mL, Other, Once PRN, Molli Barrows, MD .  lactated ringers infusion 1,000 mL, 1,000 mL, Intravenous, Continuous, Adams, Alvina Filbert,  MD .  lactated ringers infusion 1,000 mL, 1,000 mL, Intravenous, Continuous, Adams, Alvina Filbert, MD .  midazolam (VERSED) 5 MG/5ML injection 5 mg, 5 mg, Intravenous, Once, Molli Barrows, MD .  midazolam (VERSED) injection 5 mg, 5 mg, Intravenous, Once, Molli Barrows, MD .  ropivacaine (PF) 2 mg/ml (0.2%) (NAROPIN) epidural 10 mL, 10 mL, Epidural, Once, Molli Barrows, MD .  triamcinolone acetonide (KENALOG-40) injection 40 mg, 40 mg, Other, Once, Andree Elk Alvina Filbert, MD  Allergies  Allergen Reactions  . 2,4-D Dimethylamine (Amisol) Swelling  . Bee Venom Swelling  . Nsaids Other (See Comments)    Cannot take due to abd surgery  . Other Hives and Other (See Comments)    Cannot take due to abd surgery Cannot take due to abd surgery  . Tetanus Toxoid Adsorbed Other (See Comments)    Swelling, hot  . Tetanus Toxoids     Swelling, hot     Review of Systems  Constitutional: Positive for irritability.  Respiratory: Positive for shortness of breath.   Cardiovascular: Positive for chest pain.  Gastrointestinal: Negative for bowel incontinence.  Genitourinary: Negative for bladder incontinence.  Musculoskeletal: Positive for back pain.      Objective  Vitals:   01/27/17 0923  BP: 118/77  Pulse: 73  Resp: 16  Temp: 97.9 F (36.6 C)  TempSrc: Oral  SpO2: 97%  Weight: 191 lb 1.6 oz (86.7 kg)  Height: 5\' 10"  (1.778 m)    Physical Exam  Constitutional: She is oriented to person, place, and time and well-developed, well-nourished, and in no distress.  HENT:  Head: Normocephalic and atraumatic.  Cardiovascular: Normal rate, regular rhythm and normal heart sounds.   No murmur heard. Pulmonary/Chest: Effort normal and breath sounds normal. She has no wheezes.  Musculoskeletal:       Lumbar back: She exhibits tenderness, pain and spasm.       Back:  Neurological: She is alert and oriented to person, place, and time.  Psychiatric: Mood, memory, affect and judgment normal.  Nursing  note and vitals reviewed.       Assessment & Plan  1. Panic disorder with agoraphobia and moderate panic attacks Worsening panic disorder, DC citalopram by tapering off, start on venlafaxine ER 37.5 mg then advancing to 75 mg daily, continue alprazolam for acute treatment of panic attacks. - ALPRAZolam (XANAX) 1 MG tablet; Take 1 tablet (1 mg total) by mouth 2 (two) times daily as needed for anxiety.  Dispense: 60 tablet; Refill: 0 - Venlafaxine HCl 37.5 MG TB24; Take 1 tablet (37.5 mg total) by mouth daily. Start Effexor XR 37.5 mg daily for 7 days, then advance to 75 mg daily  Dispense: 90 each; Refill:  2  2. Chronic right-sided low back pain without sciatica Add lidocaine for treatment of low back pain plus knee pain. Continue on opioids therapy, patient compliant with controlled substances agreement - lidocaine (LIDODERM) 5 %; Place 1 patch onto the skin daily. Remove & Discard patch within 12 hours or as directed by MD  Dispense: 30 patch; Refill: 0 - oxyCODONE-acetaminophen (PERCOCET) 10-325 MG tablet; Take 1 tablet by mouth every 8 (eight) hours as needed for pain.  Dispense: 90 tablet; Refill: 0  3. Chronic pain of right knee  - lidocaine (LIDODERM) 5 %; Place 1 patch onto the skin daily. Remove & Discard patch within 12 hours or as directed by MD  Dispense: 30 patch; Refill: 0  4. Need for influenza vaccination  - Flu Vaccine QUAD 6+ mos PF IM (Fluarix Quad PF)  Maxamillion Banas [facility] 01/27/2017 9:28 AM

## 2017-02-27 ENCOUNTER — Encounter: Payer: Self-pay | Admitting: Family Medicine

## 2017-02-27 ENCOUNTER — Ambulatory Visit (INDEPENDENT_AMBULATORY_CARE_PROVIDER_SITE_OTHER): Payer: BLUE CROSS/BLUE SHIELD | Admitting: Family Medicine

## 2017-02-27 VITALS — BP 120/68 | HR 73 | Temp 98.3°F | Resp 16 | Ht 70.0 in | Wt 193.8 lb

## 2017-02-27 DIAGNOSIS — M545 Low back pain, unspecified: Secondary | ICD-10-CM

## 2017-02-27 DIAGNOSIS — G8929 Other chronic pain: Secondary | ICD-10-CM | POA: Diagnosis not present

## 2017-02-27 DIAGNOSIS — F4001 Agoraphobia with panic disorder: Secondary | ICD-10-CM

## 2017-02-27 MED ORDER — OXYCODONE-ACETAMINOPHEN 10-325 MG PO TABS: 1.0000 | ORAL_TABLET | Freq: Three times a day (TID) | ORAL | 0 refills | Status: AC | PRN

## 2017-02-27 MED ORDER — ALPRAZOLAM 1 MG PO TABS: 1.0000 mg | ORAL_TABLET | Freq: Two times a day (BID) | ORAL | 0 refills | Status: DC | PRN

## 2017-02-27 NOTE — Progress Notes (Signed)
Name: Kiara Le   MRN: [id number]    DOB: [birth year]   Date:02/27/2017       Progress Note  Subjective  Chief Complaint  Chief Complaint  Patient presents with  . Medication Refill    Anxiety  Presents for follow-up visit. Symptoms include depressed mood, excessive worry, feeling of choking, irritability, nausea, nervous/anxious behavior, panic and shortness of breath (with panic attacks.). The severity of symptoms is moderate and causing significant distress. The quality of sleep is poor.    Back Pain  This is a chronic problem. The problem occurs constantly. The problem is unchanged. The pain is present in the lumbar spine and gluteal. The pain does not radiate. The pain is at a severity of 4/10. The symptoms are aggravated by position and bending (worse with work and other household activities.). Pertinent negatives include no bladder incontinence, bowel incontinence, leg pain, numbness, paresis or tingling. She has tried analgesics for the symptoms.      Past Medical History:  Diagnosis Date  . Anxiety   . Asthma   . Bipolar disorder ([place])   . Chronic fatigue   . Chronic low back pain   . Diabetes mellitus without complication ([place])   . Fatty liver disease, nonalcoholic   . History of Helicobacter pylori infection   . Hyperlipidemia   . Hypertension   . Insomnia   . Morbid obesity ([place])   . Panic disorder   . PTSD (post-traumatic stress disorder)   . Vitamin B12 deficiency   . Vitamin D deficiency     Past Surgical History:  Procedure Laterality Date  . ABDOMINAL HYSTERECTOMY    . GASTROPLASTY DUODENAL SWITCH    . PARTIAL HYSTERECTOMY    . SALPINGECTOMY    . TUBAL LIGATION      Family History  Problem Relation Age of Onset  . Heart disease Father   . Hypertension Father   . Hyperlipidemia Father   . Drug abuse Mother   . Anxiety disorder Sister   . Depression Sister     Social History   Social History  . Marital status: Married    Spouse name:  Michiel Cowboy  . Number of children: 2  . Years of education: N/A   Occupational History  . Customer Service    Social History Main Topics  . Smoking status: Never Smoker  . Smokeless tobacco: Never Used  . Alcohol use No  . Drug use: No  . Sexual activity: Yes    Partners: Male    Birth control/ protection: None   Other Topics Concern  . Not on file   Social History Narrative  . No narrative on file     Current Outpatient Prescriptions:  .  ALPRAZolam (XANAX) 1 MG tablet, Take 1 tablet (1 mg total) by mouth 2 (two) times daily as needed for anxiety., Disp: 60 tablet, Rfl: 0 .  glucose blood (ACCU-CHEK AVIVA PLUS) test strip, 1 strip by Does not apply route daily., Disp: , Rfl:  .  lidocaine (LIDODERM) 5 %, Place 1 patch onto the skin daily. Remove & Discard patch within 12 hours or as directed by MD, Disp: 30 patch, Rfl: 0 .  pantoprazole (PROTONIX) 40 MG tablet, TAKE ONE TABLET TWICE DAILY, Disp: 180 tablet, Rfl: 1 .  tiZANidine (ZANAFLEX) 2 MG tablet, Take 1 tablet (2 mg total) by mouth every 8 (eight) hours as needed for muscle spasms., Disp: 90 tablet, Rfl: 0 .  Venlafaxine HCl 37.5 MG TB24, Take 1  tablet (37.5 mg total) by mouth daily. Start Effexor XR 37.5 mg daily for 7 days, then advance to 75 mg daily, Disp: 90 each, Rfl: 2  Current Facility-Administered Medications:  .  fentaNYL (SUBLIMAZE) injection 100 mcg, 100 mcg, Intravenous, Once, Molli Barrows, MD .  iopamidol (ISOVUE-M) 41 % intrathecal injection 20 mL, 20 mL, Other, Once PRN, Molli Barrows, MD .  iopamidol (ISOVUE-M) 41 % intrathecal injection 20 mL, 20 mL, Other, Once PRN, Molli Barrows, MD .  lactated ringers infusion 1,000 mL, 1,000 mL, Intravenous, Continuous, Adams, Alvina Filbert, MD .  lactated ringers infusion 1,000 mL, 1,000 mL, Intravenous, Continuous, Adams, Alvina Filbert, MD .  midazolam (VERSED) 5 MG/5ML injection 5 mg, 5 mg, Intravenous, Once, Molli Barrows, MD .  midazolam (VERSED) injection 5 mg, 5 mg,  Intravenous, Once, Molli Barrows, MD .  ropivacaine (PF) 2 mg/ml (0.2%) (NAROPIN) epidural 10 mL, 10 mL, Epidural, Once, Molli Barrows, MD .  triamcinolone acetonide (KENALOG-40) injection 40 mg, 40 mg, Other, Once, Andree Elk Alvina Filbert, MD  Allergies  Allergen Reactions  . 2,4-D Dimethylamine (Amisol) Swelling  . Bee Venom Swelling  . Nsaids Other (See Comments)    Cannot take due to abd surgery  . Other Hives and Other (See Comments)    Cannot take due to abd surgery Cannot take due to abd surgery  . Tetanus Toxoid Adsorbed Other (See Comments)    Swelling, hot  . Tetanus Toxoids     Swelling, hot     Review of Systems  Constitutional: Positive for irritability.  Respiratory: Positive for shortness of breath (with panic attacks.).   Gastrointestinal: Positive for nausea. Negative for bowel incontinence.  Genitourinary: Negative for bladder incontinence.  Musculoskeletal: Positive for back pain.  Neurological: Negative for tingling and numbness.  Psychiatric/Behavioral: The patient is nervous/anxious.      Objective  Vitals:   02/27/17 0927  BP: 120/68  Pulse: 73  Resp: 16  Temp: 98.3 F (36.8 C)  TempSrc: Oral  SpO2: 97%  Weight: 193 lb 12.8 oz (87.9 kg)  Height: 5\' 10"  (1.778 m)    Physical Exam  Constitutional: She is oriented to person, place, and time and well-developed, well-nourished, and in no distress.  HENT:  Head: Normocephalic and atraumatic.  Cardiovascular: Normal rate, regular rhythm and normal heart sounds.   No murmur heard. Pulmonary/Chest: Effort normal and breath sounds normal. She has no wheezes.  Musculoskeletal:       Lumbar back: She exhibits tenderness, pain and spasm.       Back:  Neurological: She is alert and oriented to person, place, and time.  Psychiatric: Mood, memory, affect and judgment normal.  Nursing note and vitals reviewed.       Assessment & Plan  1. Panic disorder with agoraphobia and moderate panic  attacks Stable, responsive to alprazolam taken twice daily as needed, patient compliant with controlled substance agreement, understands the dependence potential, side effects and drug interactions of benzodiazepines, especially with opioids. Refills provided - ALPRAZolam (XANAX) 1 MG tablet; Take 1 tablet (1 mg total) by mouth 2 (two) times daily as needed for anxiety.  Dispense: 60 tablet; Refill: 0  2. Chronic right-sided low back pain without sciatica Symptoms of chronic low back pain stable and responsive to Percocet taken every 8 hours as needed, patient compliant with controlled substance agreement. - oxyCODONE-acetaminophen (PERCOCET) 10-325 MG tablet; Take 1 tablet by mouth every 8 (eight) hours as needed for pain.  Dispense:  90 tablet; Refill: 0   Hafsah Hendler [facility] 02/27/2017 10:04 AM

## 2017-03-18 ENCOUNTER — Ambulatory Visit
Admission: EM | Admit: 2017-03-18 | Discharge: 2017-03-18 | Disposition: A | Discharge status: Home / Self Care | Payer: BLUE CROSS/BLUE SHIELD | Attending: Family Medicine | Admitting: Family Medicine

## 2017-03-18 ENCOUNTER — Encounter: Payer: Self-pay | Admitting: Emergency Medicine

## 2017-03-18 DIAGNOSIS — M533 Sacrococcygeal disorders, not elsewhere classified: Secondary | ICD-10-CM | POA: Diagnosis not present

## 2017-03-18 DIAGNOSIS — M545 Low back pain: Secondary | ICD-10-CM

## 2017-03-18 MED ORDER — KETOROLAC TROMETHAMINE 60 MG/2ML IM SOLN
60.0000 mg | Freq: Once | INTRAMUSCULAR | Status: AC
  Administered 2017-03-18: 60 mg via INTRAMUSCULAR

## 2017-03-18 MED ORDER — METHYLPREDNISOLONE 4 MG PO TBPK: ORAL_TABLET | ORAL | 0 refills | Status: DC

## 2017-03-18 MED ORDER — METAXALONE 800 MG PO TABS: 800.0000 mg | ORAL_TABLET | Freq: Three times a day (TID) | ORAL | 0 refills | Status: DC

## 2017-03-18 NOTE — Discharge Instructions (Signed)
Use ice 20 minutes very 2 hours 4-5 times daily.avoid symptoms as we discussed. Avoid sitting lifting and bending specifically.

## 2017-03-18 NOTE — ED Triage Notes (Signed)
Patient c/o low back and right hip pain x 3 days. Patient has used ice, heat, Ibuprofen 800mg . Patient denies injury, but she was raking leaves Saturday morning and started experiencing back pain that night. Pain is getting progressively worse. Patient has been taking prescription medication that she has at home due to DDD. Patient has taken Tizanidine 2 mg and Oxycodone 10 mg.

## 2017-03-18 NOTE — ED Provider Notes (Signed)
[facility]    CSN: [id number] Arrival date & time: 03/18/17  1308     History   Chief Complaint Chief Complaint  Patient presents with  . Back Pain  . Hip Pain    HPI Kiara Le is a 41 y.o. female.   HPI  41 year old female who presents with low back painright for the last 3 days.States that she was raking leaves to a clear pathway for her mother to walk so that she would not inadvertently fall.raking leaves she began to experience the back pain at night. He has no radicular component. Seems to be  localized over the right sacroiliac joint. She has been taking Zana flex and oxycodone.She denies any incontinence     Past Medical History:  Diagnosis Date  . Anxiety   . Asthma   . Bipolar disorder ([place])   . Chronic fatigue   . Chronic low back pain   . Diabetes mellitus without complication ([place])   . Fatty liver disease, nonalcoholic   . History of Helicobacter pylori infection   . Hyperlipidemia   . Hypertension   . Insomnia   . Morbid obesity ([place])   . Panic disorder   . PTSD (post-traumatic stress disorder)   . Vitamin B12 deficiency   . Vitamin D deficiency     Patient Active Problem List   Diagnosis Date Noted  . Hypoparathyroidism ([place]) 10/08/2016  . S/P gastric bypass 09/04/2016  . Insect bite of thigh, infected, right, subsequent encounter 05/02/2016  . Antibiotic-induced yeast infection 05/02/2016  . Vitamin B12 deficiency 11/30/2015  . History of diabetes mellitus, type II 11/30/2015  . Enteritis 07/06/2015  . Vitamin D insufficiency 06/06/2015  . Arthritis of right knee 01/24/2015  . Motion sickness 01/24/2015  . Panic disorder with agoraphobia 11/24/2014  . Anxiety and depression 11/24/2014  . Arthralgia of lower leg 11/24/2014  . Back pain with radiation 11/24/2014  . Continuous opioid dependence ([place]) 11/24/2014  . GERD (gastroesophageal reflux disease) 11/24/2014  . Chronic low back pain without sciatica 11/24/2014  .  Panic disorder with agoraphobia and moderate panic attacks 11/24/2014  . H/O surgical procedure 04/18/2014  . Chest discomfort 05/05/2013    Past Surgical History:  Procedure Laterality Date  . ABDOMINAL HYSTERECTOMY    . GASTROPLASTY DUODENAL SWITCH    . PARTIAL HYSTERECTOMY    . SALPINGECTOMY    . TUBAL LIGATION      OB History    Gravida Para Term Preterm AB Living   3 2           SAB TAB Ectopic Multiple Live Births                   Home Medications    Prior to Admission medications   Medication Sig Start Date End Date Taking? Authorizing Provider  ALPRAZolam Duanne Moron) 1 MG tablet Take 1 tablet (1 mg total) by mouth 2 (two) times daily as needed for anxiety. 02/27/17  Yes Keith Rake Asad A, MD  lidocaine (LIDODERM) 5 % Place 1 patch onto the skin daily. Remove & Discard patch within 12 hours or as directed by MD 01/27/17  Yes Roselee Nova, MD  oxyCODONE-acetaminophen (PERCOCET) 10-325 MG tablet Take 1 tablet by mouth every 8 (eight) hours as needed for pain. 02/27/17 03/29/17 Yes Roselee Nova, MD  pantoprazole (PROTONIX) 40 MG tablet TAKE ONE TABLET TWICE DAILY 06/10/16  Yes Roselee Nova, MD  tiZANidine (ZANAFLEX) 2 MG  tablet Take 1 tablet (2 mg total) by mouth every 8 (eight) hours as needed for muscle spasms. 04/03/16  Yes Roselee Nova, MD  Venlafaxine HCl 37.5 MG TB24 Take 1 tablet (37.5 mg total) by mouth daily. Start Effexor XR 37.5 mg daily for 7 days, then advance to 75 mg daily 01/27/17  Yes Keith Rake Asad A, MD  glucose blood (ACCU-CHEK AVIVA PLUS) test strip 1 strip by Does not apply route daily. 03/31/14   [provider]  metaxalone (SKELAXIN) 800 MG tablet Take 1 tablet (800 mg total) by mouth 3 (three) times daily. 03/18/17   Lorin Picket, PA-C  methylPREDNISolone (MEDROL DOSEPAK) 4 MG TBPK tablet Take per package instructions 03/18/17   Lorin Picket, PA-C    Family History Family History  Problem Relation Age of Onset  .  Heart disease Father   . Hypertension Father   . Hyperlipidemia Father   . Drug abuse Mother   . Anxiety disorder Sister   . Depression Sister     Social History Social History  Substance Use Topics  . Smoking status: Never Smoker  . Smokeless tobacco: Never Used  . Alcohol use No     Allergies   2,4-d dimethylamine (amisol); Bee venom; Nsaids; Other; Tetanus toxoid adsorbed; and Tetanus toxoids   Review of Systems Review of Systems  Constitutional: Positive for activity change. Negative for appetite change, chills, fatigue and fever.  Musculoskeletal: Positive for back pain.  All other systems reviewed and are negative.    Physical Exam Triage Vital Signs ED Triage Vitals  Enc Vitals Group     BP 03/18/17 1035 121/68     Pulse Rate 03/18/17 1035 71     Resp 03/18/17 1035 16     Temp 03/18/17 1035 97.8 F (36.6 C)     Temp Source 03/18/17 1035 Oral     SpO2 03/18/17 1035 100 %     Weight 03/18/17 1035 187 lb (84.8 kg)     Height 03/18/17 1035 5\' 11"  (1.803 m)     Head Circumference --      Peak Flow --      Pain Score 03/18/17 1036 7     Pain Loc --      Pain Edu? --      Excl. in [place]? --    No data found.   Updated Vital Signs BP 121/68 (BP Location: Left Arm)   Pulse 71   Temp 97.8 F (36.6 C) (Oral)   Resp 16   Ht 5\' 11"  (1.803 m)   Wt 187 lb (84.8 kg)   SpO2 100%   BMI 26.08 kg/m   Visual Acuity Right Eye Distance:   Left Eye Distance:   Bilateral Distance:    Right Eye Near:   Left Eye Near:    Bilateral Near:     Physical Exam  Constitutional: She is oriented to person, place, and time. She appears well-developed and well-nourished. No distress.  HENT:  Head: Normocephalic.  Eyes: Pupils are equal, round, and reactive to light. Right eye exhibits no discharge. Left eye exhibits no discharge.  Neck: Normal range of motion.  Musculoskeletal: She exhibits tenderness.  Examination of the lumbar spine shows a level pelvis in stance.  Maximal tenderness is sharply localized over the right sacroiliac joint reproducing her symptoms. She has no sciatic notch tenderness. Range of motion to full flexion patient is able to flex to hands at the level of her knee. Retaining  upright posture is difficult and painful in the right sacral iliac joint. Bilateral flexion is full but has more pain with left forward flexion.Neurological exam is intact. Sensation is intact throughout.  Neurological: She is alert and oriented to person, place, and time.  Skin: Skin is warm and dry. She is not diaphoretic.  Psychiatric: She has a normal mood and affect. Her behavior is normal. Judgment and thought content normal.  Nursing note and vitals reviewed.    UC Treatments / Results  Labs (all labs ordered are listed, but only abnormal results are displayed) Labs Reviewed - No data to display  EKG  EKG Interpretation None       Radiology No results found.  Procedures Procedures (including critical care time)  Medications Ordered in UC Medications  ketorolac (TORADOL) injection 60 mg (60 mg Intramuscular Given 03/18/17 1124)     Initial Impression / Assessment and Plan / UC Course  I have reviewed the triage vital signs and the nursing notes.  Pertinent labs & imaging results that were available during my care of the patient were reviewed by me and considered in my medical decision making (see chart for details).     Plan: 1. Test/x-ray results and diagnosis reviewed with patient 2. rx as per orders; risks, benefits, potential side effects reviewed with patient 3. Recommend supportive treatment with symptom avoidance and rest. Avoid sitting lifting and bending. Will change her from Zanaflex to Skelaxin. Patient has not supposed to taking NSAIDs due to her gastric bypass will place her on a Medrol Dosepak and ice packs. I recommended that she follow-up with her primary care she is not improving. 4. F/u prn if symptoms worsen or don't  improve   Final Clinical Impressions(s) / UC Diagnoses   Final diagnoses:  Sacroiliac joint pain    New Prescriptions Discharge Medication List as of 03/18/2017 11:25 AM    START taking these medications   Details  metaxalone (SKELAXIN) 800 MG tablet Take 1 tablet (800 mg total) by mouth 3 (three) times daily., Starting Tue 03/18/2017, Normal    methylPREDNISolone (MEDROL DOSEPAK) 4 MG TBPK tablet Take per package instructions, Normal         Controlled Substance Prescriptions [place] Controlled Substance Registry consulted? Not Applicable   Lorin Picket, PA-C 03/18/17 2038

## 2017-03-24 DIAGNOSIS — M5430 Sciatica, unspecified side: Secondary | ICD-10-CM | POA: Diagnosis not present

## 2017-03-24 DIAGNOSIS — M791 Myalgia, unspecified site: Secondary | ICD-10-CM | POA: Diagnosis not present

## 2017-03-24 DIAGNOSIS — M9903 Segmental and somatic dysfunction of lumbar region: Secondary | ICD-10-CM | POA: Diagnosis not present

## 2017-03-24 DIAGNOSIS — M9905 Segmental and somatic dysfunction of pelvic region: Secondary | ICD-10-CM | POA: Diagnosis not present

## 2017-03-26 DIAGNOSIS — M9905 Segmental and somatic dysfunction of pelvic region: Secondary | ICD-10-CM | POA: Diagnosis not present

## 2017-03-26 DIAGNOSIS — M9903 Segmental and somatic dysfunction of lumbar region: Secondary | ICD-10-CM | POA: Diagnosis not present

## 2017-03-26 DIAGNOSIS — M5430 Sciatica, unspecified side: Secondary | ICD-10-CM | POA: Diagnosis not present

## 2017-03-26 DIAGNOSIS — M791 Myalgia, unspecified site: Secondary | ICD-10-CM | POA: Diagnosis not present

## 2017-03-28 DIAGNOSIS — M791 Myalgia, unspecified site: Secondary | ICD-10-CM | POA: Diagnosis not present

## 2017-03-28 DIAGNOSIS — M9903 Segmental and somatic dysfunction of lumbar region: Secondary | ICD-10-CM | POA: Diagnosis not present

## 2017-03-28 DIAGNOSIS — M9905 Segmental and somatic dysfunction of pelvic region: Secondary | ICD-10-CM | POA: Diagnosis not present

## 2017-03-28 DIAGNOSIS — M5430 Sciatica, unspecified side: Secondary | ICD-10-CM | POA: Diagnosis not present

## 2017-03-31 DIAGNOSIS — E663 Overweight: Secondary | ICD-10-CM | POA: Diagnosis not present

## 2017-03-31 DIAGNOSIS — F3130 Bipolar disorder, current episode depressed, mild or moderate severity, unspecified: Secondary | ICD-10-CM | POA: Diagnosis not present

## 2017-03-31 DIAGNOSIS — Z713 Dietary counseling and surveillance: Secondary | ICD-10-CM | POA: Diagnosis not present

## 2017-03-31 DIAGNOSIS — Z9884 Bariatric surgery status: Secondary | ICD-10-CM | POA: Diagnosis not present

## 2017-03-31 DIAGNOSIS — K912 Postsurgical malabsorption, not elsewhere classified: Secondary | ICD-10-CM | POA: Diagnosis not present

## 2017-03-31 DIAGNOSIS — Z48815 Encounter for surgical aftercare following surgery on the digestive system: Secondary | ICD-10-CM | POA: Diagnosis not present

## 2017-03-31 DIAGNOSIS — Z6827 Body mass index (BMI) 27.0-27.9, adult: Secondary | ICD-10-CM | POA: Diagnosis not present

## 2017-04-01 ENCOUNTER — Ambulatory Visit (INDEPENDENT_AMBULATORY_CARE_PROVIDER_SITE_OTHER): Payer: BLUE CROSS/BLUE SHIELD | Admitting: Family Medicine

## 2017-04-01 ENCOUNTER — Encounter: Payer: Self-pay | Admitting: Family Medicine

## 2017-04-01 VITALS — BP 118/64 | HR 90 | Temp 99.1°F | Resp 14 | Ht 69.0 in | Wt 189.5 lb

## 2017-04-01 DIAGNOSIS — M545 Low back pain: Secondary | ICD-10-CM

## 2017-04-01 DIAGNOSIS — K912 Postsurgical malabsorption, not elsewhere classified: Secondary | ICD-10-CM | POA: Diagnosis not present

## 2017-04-01 DIAGNOSIS — Z713 Dietary counseling and surveillance: Secondary | ICD-10-CM | POA: Diagnosis not present

## 2017-04-01 DIAGNOSIS — F4001 Agoraphobia with panic disorder: Secondary | ICD-10-CM

## 2017-04-01 DIAGNOSIS — G8929 Other chronic pain: Secondary | ICD-10-CM | POA: Diagnosis not present

## 2017-04-01 DIAGNOSIS — F1120 Opioid dependence, uncomplicated: Secondary | ICD-10-CM

## 2017-04-01 DIAGNOSIS — Z9884 Bariatric surgery status: Secondary | ICD-10-CM | POA: Diagnosis not present

## 2017-04-01 MED ORDER — VENLAFAXINE HCL ER 75 MG PO TB24: 1.0000 | ORAL_TABLET | Freq: Every day | ORAL | 2 refills | Status: DC

## 2017-04-01 MED ORDER — OXYCODONE-ACETAMINOPHEN 10-325 MG PO TABS: 1.0000 | ORAL_TABLET | Freq: Three times a day (TID) | ORAL | 0 refills | Status: DC | PRN

## 2017-04-01 MED ORDER — ALPRAZOLAM 1 MG PO TABS: 1.0000 mg | ORAL_TABLET | Freq: Two times a day (BID) | ORAL | 0 refills | Status: DC | PRN

## 2017-04-01 NOTE — Progress Notes (Signed)
Name: Kiara Le   MRN: [id number]    DOB: 1976/05/13   Date:04/01/2017       Progress Note  Subjective  Chief Complaint  Chief Complaint  Patient presents with  . Back Pain  . Medication Refill    pain meds and xanax    Back Pain  This is a chronic problem. The problem is unchanged. The pain is present in the gluteal and lumbar spine. The pain does not radiate. The pain is at a severity of 4/10. Pertinent negatives include no bladder incontinence, bowel incontinence, leg pain, numbness or weakness. She has tried analgesics and muscle relaxant (started on Metaxolone 800 mg for relief of muscle spasm.) for the symptoms.  Anxiety  Presents for follow-up visit. Symptoms include depressed mood, excessive worry, feeling of choking, nervous/anxious behavior and panic. Patient reports no insomnia. The severity of symptoms is moderate and causing significant distress. The quality of sleep is good.      Past Medical History:  Diagnosis Date  . Anxiety   . Asthma   . Bipolar disorder ([place])   . Chronic fatigue   . Chronic low back pain   . Diabetes mellitus without complication ([place])   . Fatty liver disease, nonalcoholic   . History of Helicobacter pylori infection   . Hyperlipidemia   . Hypertension   . Insomnia   . Morbid obesity ([place])   . Panic disorder   . PTSD (post-traumatic stress disorder)   . Vitamin B12 deficiency   . Vitamin D deficiency     Past Surgical History:  Procedure Laterality Date  . ABDOMINAL HYSTERECTOMY    . GASTROPLASTY DUODENAL SWITCH    . PARTIAL HYSTERECTOMY    . SALPINGECTOMY    . TUBAL LIGATION      Family History  Problem Relation Age of Onset  . Heart disease Father   . Hypertension Father   . Hyperlipidemia Father   . Drug abuse Mother   . Anxiety disorder Sister   . Depression Sister     Social History   Social History  . Marital status: Married    Spouse name: Michiel Cowboy  . Number of children: 2  . Years of education: N/A    Occupational History  . Customer Service    Social History Main Topics  . Smoking status: Never Smoker  . Smokeless tobacco: Never Used  . Alcohol use No  . Drug use: No  . Sexual activity: Yes    Partners: Male    Birth control/ protection: None   Other Topics Concern  . Not on file   Social History Narrative  . No narrative on file     Current Outpatient Prescriptions:  .  ALPRAZolam (XANAX) 1 MG tablet, Take 1 tablet (1 mg total) by mouth 2 (two) times daily as needed for anxiety., Disp: 60 tablet, Rfl: 0 .  lidocaine (LIDODERM) 5 %, Place 1 patch onto the skin daily. Remove & Discard patch within 12 hours or as directed by MD, Disp: 30 patch, Rfl: 0 .  metaxalone (SKELAXIN) 800 MG tablet, Take 1 tablet (800 mg total) by mouth 3 (three) times daily., Disp: 21 tablet, Rfl: 0 .  oxyCODONE-acetaminophen (PERCOCET) 10-325 MG tablet, Take 1 tablet by mouth 3 (three) times daily as needed., Disp: , Rfl:  .  pantoprazole (PROTONIX) 40 MG tablet, TAKE ONE TABLET TWICE DAILY, Disp: 180 tablet, Rfl: 1 .  Venlafaxine HCl 37.5 MG TB24, Take 1 tablet (37.5 mg total) by mouth  daily. Start Effexor XR 37.5 mg daily for 7 days, then advance to 75 mg daily, Disp: 90 each, Rfl: 2 .  glucose blood (ACCU-CHEK AVIVA PLUS) test strip, 1 strip by Does not apply route daily., Disp: , Rfl:  .  methylPREDNISolone (MEDROL DOSEPAK) 4 MG TBPK tablet, Take per package instructions (Patient not taking: Reported on 04/01/2017), Disp: 21 tablet, Rfl: 0 .  tiZANidine (ZANAFLEX) 2 MG tablet, Take 1 tablet (2 mg total) by mouth every 8 (eight) hours as needed for muscle spasms. (Patient not taking: Reported on 04/01/2017), Disp: 90 tablet, Rfl: 0  Current Facility-Administered Medications:  .  fentaNYL (SUBLIMAZE) injection 100 mcg, 100 mcg, Intravenous, Once, Molli Barrows, MD .  iopamidol (ISOVUE-M) 41 % intrathecal injection 20 mL, 20 mL, Other, Once PRN, Molli Barrows, MD .  iopamidol (ISOVUE-M) 41 %  intrathecal injection 20 mL, 20 mL, Other, Once PRN, Molli Barrows, MD .  lactated ringers infusion 1,000 mL, 1,000 mL, Intravenous, Continuous, Adams, Alvina Filbert, MD .  lactated ringers infusion 1,000 mL, 1,000 mL, Intravenous, Continuous, Adams, Alvina Filbert, MD .  midazolam (VERSED) 5 MG/5ML injection 5 mg, 5 mg, Intravenous, Once, Molli Barrows, MD .  midazolam (VERSED) injection 5 mg, 5 mg, Intravenous, Once, Molli Barrows, MD .  ropivacaine (PF) 2 mg/ml (0.2%) (NAROPIN) epidural 10 mL, 10 mL, Epidural, Once, Molli Barrows, MD .  triamcinolone acetonide (KENALOG-40) injection 40 mg, 40 mg, Other, Once, Andree Elk Alvina Filbert, MD  Allergies  Allergen Reactions  . 2,4-D Dimethylamine (Amisol) Swelling  . Bee Venom Swelling  . Nsaids Other (See Comments)    Cannot take due to abd surgery  . Other Hives and Other (See Comments)    Cannot take due to abd surgery Cannot take due to abd surgery  . Tetanus Toxoid Adsorbed Other (See Comments)    Swelling, hot  . Tetanus Toxoids     Swelling, hot     Review of Systems  Gastrointestinal: Negative for bowel incontinence.  Genitourinary: Negative for bladder incontinence.  Musculoskeletal: Positive for back pain.  Neurological: Negative for weakness and numbness.  Psychiatric/Behavioral: The patient is nervous/anxious. The patient does not have insomnia.     Objective  Vitals:   04/01/17 0938 04/01/17 1002  BP:  118/64  Pulse:  90  Resp:  14  Temp:  99.1 F (37.3 C)  TempSrc:  Oral  SpO2:  94%  Weight:  189 lb 8 oz (86 kg)  Height: 5\' 11"  (1.803 m) 5\' 9"  (1.753 m)    Physical Exam  Constitutional: She is oriented to person, place, and time and well-developed, well-nourished, and in no distress.  HENT:  Head: Normocephalic and atraumatic.  Cardiovascular: Normal rate, regular rhythm and normal heart sounds.   No murmur heard. Pulmonary/Chest: Effort normal and breath sounds normal. She has no wheezes.  Musculoskeletal:       Lumbar  back: She exhibits tenderness, pain and spasm.       Back:  Neurological: She is alert and oriented to person, place, and time.  Psychiatric: Mood, memory, affect and judgment normal.  Nursing note and vitals reviewed.    Assessment & Plan  1. Panic disorder with agoraphobia and moderate panic attacks Stable on Alprazolam taken up to twice daily as needed, compliant with controlled substances agreement, informed of my impending departure from practice and need for referral to Psychiatry, Increase Venlafaxine to 75 mg daily.  - ALPRAZolam (XANAX) 1 MG tablet; Take  1 tablet (1 mg total) by mouth 2 (two) times daily as needed for anxiety.  Dispense: 60 tablet; Refill: 0 - Ambulatory referral to Psychiatry - Venlafaxine HCl 75 MG TB24; Take 1 tablet (75 mg total) by mouth daily.  Dispense: 30 each; Refill: 2  2. Chronic right-sided low back pain without sciatica Stable, continue on percocet as needed, compliant with controlled substances agreement, referral to pain clinic for pain management after my departure from practice.   - oxyCODONE-acetaminophen (PERCOCET) 10-325 MG tablet; Take 1 tablet by mouth 3 (three) times daily as needed.  Dispense: 90 tablet; Refill: 0 - Ambulatory referral to Pain Clinic  3. Continuous opioid dependence ([place]) As above, referral to Pain Clinic.   Marlos Carmen Asad A. [facility] 04/01/2017 10:50 AM

## 2017-04-01 NOTE — Progress Notes (Signed)
The following letter was provided to Kiara Le during their visit today: ---------------------------------------  Dear valued [facility] Patient,  I am writing to share that as of July 18, 2017, I will no longer be seeing patients at [facility]. While it has been my privilege to care for you as a physician, I have decided to move outside of [place] to pursue other opportunities.  The staff at [facility] has been supportive of my decision and are supportive of any patients who have been under my care.  They will be happy to provide care to you and your family.  The office staff will do everything they can to ensure a seamless transition of care at [facility].  However if you are on any controlled substance medications (i.e. Pain medication or benzodiazepines), they will no longer be able to refill those medications, but we will be more than happy to refer you to a specialist.  [place] center will also assist you with the transfer of medical records should you wish to seek care elsewhere.  If you have any questions about your future care, you may call the office at [phone].  I have enjoyed getting to know my patients here and I wish you the very best.  Sincerely,  Kiara Brome, MD  ---------------------------------------  A written copy of this letter was given to the patient.  The patient verbalizes understanding of the letter, and does request referral to specialist or to new primary care provider.  This decision has been conveyed to Dr. Manuella Le, who will place any appropriate referrals during today's visit.

## 2017-04-07 ENCOUNTER — Other Ambulatory Visit: Payer: Self-pay | Admitting: Family Medicine

## 2017-04-07 DIAGNOSIS — M25561 Pain in right knee: Principal | ICD-10-CM

## 2017-04-07 DIAGNOSIS — G8929 Other chronic pain: Secondary | ICD-10-CM

## 2017-04-07 DIAGNOSIS — K219 Gastro-esophageal reflux disease without esophagitis: Secondary | ICD-10-CM

## 2017-04-07 DIAGNOSIS — M5430 Sciatica, unspecified side: Secondary | ICD-10-CM | POA: Diagnosis not present

## 2017-04-07 DIAGNOSIS — M9903 Segmental and somatic dysfunction of lumbar region: Secondary | ICD-10-CM | POA: Diagnosis not present

## 2017-04-07 DIAGNOSIS — M9905 Segmental and somatic dysfunction of pelvic region: Secondary | ICD-10-CM | POA: Diagnosis not present

## 2017-04-07 DIAGNOSIS — M791 Myalgia, unspecified site: Secondary | ICD-10-CM | POA: Diagnosis not present

## 2017-04-07 DIAGNOSIS — M545 Low back pain: Secondary | ICD-10-CM

## 2017-04-14 DIAGNOSIS — M5430 Sciatica, unspecified side: Secondary | ICD-10-CM | POA: Diagnosis not present

## 2017-04-14 DIAGNOSIS — M9905 Segmental and somatic dysfunction of pelvic region: Secondary | ICD-10-CM | POA: Diagnosis not present

## 2017-04-14 DIAGNOSIS — M791 Myalgia, unspecified site: Secondary | ICD-10-CM | POA: Diagnosis not present

## 2017-04-14 DIAGNOSIS — M9903 Segmental and somatic dysfunction of lumbar region: Secondary | ICD-10-CM | POA: Diagnosis not present

## 2017-04-16 DIAGNOSIS — M9905 Segmental and somatic dysfunction of pelvic region: Secondary | ICD-10-CM | POA: Diagnosis not present

## 2017-04-16 DIAGNOSIS — M791 Myalgia, unspecified site: Secondary | ICD-10-CM | POA: Diagnosis not present

## 2017-04-16 DIAGNOSIS — M5430 Sciatica, unspecified side: Secondary | ICD-10-CM | POA: Diagnosis not present

## 2017-04-16 DIAGNOSIS — M9903 Segmental and somatic dysfunction of lumbar region: Secondary | ICD-10-CM | POA: Diagnosis not present

## 2017-05-06 ENCOUNTER — Telehealth: Payer: Self-pay | Admitting: Family Medicine

## 2017-05-06 ENCOUNTER — Ambulatory Visit
Payer: BLUE CROSS/BLUE SHIELD | Attending: Student in an Organized Health Care Education/Training Program | Admitting: Student in an Organized Health Care Education/Training Program

## 2017-05-06 ENCOUNTER — Encounter: Payer: Self-pay | Admitting: Student in an Organized Health Care Education/Training Program

## 2017-05-06 VITALS — BP 120/78 | HR 78 | Temp 98.3°F | Resp 16 | Ht 70.0 in | Wt 189.0 lb

## 2017-05-06 DIAGNOSIS — Z7952 Long term (current) use of systemic steroids: Secondary | ICD-10-CM | POA: Diagnosis not present

## 2017-05-06 DIAGNOSIS — G894 Chronic pain syndrome: Secondary | ICD-10-CM

## 2017-05-06 DIAGNOSIS — Z79899 Other long term (current) drug therapy: Secondary | ICD-10-CM | POA: Diagnosis not present

## 2017-05-06 DIAGNOSIS — Z91030 Bee allergy status: Secondary | ICD-10-CM | POA: Diagnosis not present

## 2017-05-06 DIAGNOSIS — M549 Dorsalgia, unspecified: Secondary | ICD-10-CM | POA: Diagnosis not present

## 2017-05-06 DIAGNOSIS — K219 Gastro-esophageal reflux disease without esophagitis: Secondary | ICD-10-CM | POA: Diagnosis not present

## 2017-05-06 DIAGNOSIS — Z888 Allergy status to other drugs, medicaments and biological substances status: Secondary | ICD-10-CM | POA: Diagnosis not present

## 2017-05-06 DIAGNOSIS — M25551 Pain in right hip: Secondary | ICD-10-CM

## 2017-05-06 DIAGNOSIS — M25561 Pain in right knee: Secondary | ICD-10-CM | POA: Diagnosis not present

## 2017-05-06 DIAGNOSIS — Z9884 Bariatric surgery status: Secondary | ICD-10-CM | POA: Insufficient documentation

## 2017-05-06 DIAGNOSIS — K760 Fatty (change of) liver, not elsewhere classified: Secondary | ICD-10-CM | POA: Insufficient documentation

## 2017-05-06 DIAGNOSIS — F419 Anxiety disorder, unspecified: Secondary | ICD-10-CM

## 2017-05-06 DIAGNOSIS — M545 Low back pain, unspecified: Secondary | ICD-10-CM

## 2017-05-06 DIAGNOSIS — G8929 Other chronic pain: Secondary | ICD-10-CM

## 2017-05-06 DIAGNOSIS — Z818 Family history of other mental and behavioral disorders: Secondary | ICD-10-CM | POA: Diagnosis not present

## 2017-05-06 DIAGNOSIS — F319 Bipolar disorder, unspecified: Secondary | ICD-10-CM | POA: Insufficient documentation

## 2017-05-06 DIAGNOSIS — E559 Vitamin D deficiency, unspecified: Secondary | ICD-10-CM | POA: Insufficient documentation

## 2017-05-06 DIAGNOSIS — Z8249 Family history of ischemic heart disease and other diseases of the circulatory system: Secondary | ICD-10-CM | POA: Insufficient documentation

## 2017-05-06 DIAGNOSIS — F410 Panic disorder [episodic paroxysmal anxiety] without agoraphobia: Secondary | ICD-10-CM | POA: Insufficient documentation

## 2017-05-06 DIAGNOSIS — F32A Depression, unspecified: Secondary | ICD-10-CM

## 2017-05-06 DIAGNOSIS — E785 Hyperlipidemia, unspecified: Secondary | ICD-10-CM | POA: Insufficient documentation

## 2017-05-06 DIAGNOSIS — J45909 Unspecified asthma, uncomplicated: Secondary | ICD-10-CM | POA: Insufficient documentation

## 2017-05-06 DIAGNOSIS — F329 Major depressive disorder, single episode, unspecified: Secondary | ICD-10-CM | POA: Diagnosis not present

## 2017-05-06 DIAGNOSIS — F4310 Post-traumatic stress disorder, unspecified: Secondary | ICD-10-CM | POA: Diagnosis not present

## 2017-05-06 DIAGNOSIS — Z9889 Other specified postprocedural states: Secondary | ICD-10-CM | POA: Diagnosis not present

## 2017-05-06 DIAGNOSIS — E1122 Type 2 diabetes mellitus with diabetic chronic kidney disease: Secondary | ICD-10-CM | POA: Insufficient documentation

## 2017-05-06 DIAGNOSIS — I129 Hypertensive chronic kidney disease with stage 1 through stage 4 chronic kidney disease, or unspecified chronic kidney disease: Secondary | ICD-10-CM | POA: Insufficient documentation

## 2017-05-06 DIAGNOSIS — Z79891 Long term (current) use of opiate analgesic: Secondary | ICD-10-CM | POA: Diagnosis not present

## 2017-05-06 DIAGNOSIS — M7918 Myalgia, other site: Secondary | ICD-10-CM

## 2017-05-06 DIAGNOSIS — E876 Hypokalemia: Secondary | ICD-10-CM | POA: Insufficient documentation

## 2017-05-06 DIAGNOSIS — Z90710 Acquired absence of both cervix and uterus: Secondary | ICD-10-CM | POA: Insufficient documentation

## 2017-05-06 MED ORDER — CYCLOBENZAPRINE HCL 10 MG PO TABS: 10.0000 mg | ORAL_TABLET | Freq: Three times a day (TID) | ORAL | 2 refills | Status: DC | PRN

## 2017-05-06 MED ORDER — GABAPENTIN 300 MG PO CAPS: ORAL_CAPSULE | ORAL | 2 refills | Status: DC

## 2017-05-06 NOTE — Patient Instructions (Signed)
1.  Referral to psychiatry for management of depression and anxiety 2.  Gabapentin 300 mg nightly for 2 weeks then 300 mg twice daily for 2 weeks then 200 mg 3 times a day. 3.  Flexeril 10 mill grams 3 times daily as needed muscle spasms 4.  Follow-up in 6 weeks.

## 2017-05-06 NOTE — Progress Notes (Signed)
Patient's Name: Kiara Le  MRN: [id number]  Referring Provider: Roselee Nova, MD  DOB: 03-12-1976  PCP: Roselee Nova, MD  DOS: 05/06/2017  Note by: Gillis Santa, MD  Service setting: Ambulatory outpatient  Specialty: Interventional Pain Management  Location: [facility] (AMB) Pain Management Facility  Visit type: Initial Patient Evaluation  Patient type: New Patient   Primary Reason(s) for Visit: Encounter for initial evaluation of one or more chronic problems (new to examiner) potentially causing chronic pain, and posing a threat to normal musculoskeletal function. (Level of risk: High) CC: Back Pain (lower right) and Knee Pain (right)  HPI  Kiara Le is a 41 y.o. year old, female patient, who comes today to see [place] for the first time for an initial evaluation of her chronic pain. She has Chest discomfort; Panic disorder with agoraphobia; Anxiety and depression; Arthralgia of lower leg; Back pain with radiation; H/O surgical procedure; Continuous opioid dependence ([place]); GERD (gastroesophageal reflux disease); Chronic low back pain without sciatica; Panic disorder with agoraphobia and moderate panic attacks; Arthritis of right knee; Motion sickness; Vitamin D insufficiency; Enteritis; Vitamin B12 deficiency; History of diabetes mellitus, type II; Insect bite of thigh, infected, right, subsequent encounter; Antibiotic-induced yeast infection; S/P gastric bypass; and Hypoparathyroidism (HCC) on their problem list. Today she comes in for evaluation of her Back Pain (lower right) and Knee Pain (right)  Pain Assessment: Location: Lower, Right(right) Back(knee) Radiating: into the right hip Onset: More than a month ago Duration: Chronic pain Quality: Aching, Stabbing, Constant Severity: 4 /10 (self-reported pain score)  Note: Reported level is inconsistent with clinical observations. Clinically the patient looks like a 2/10 A 2/10 is viewed as "Mild to Moderate" and described as noticeable and  distracting. Impossible to hide from other people. More frequent flare-ups. Still possible to adapt and function close to normal. It can be very annoying and may have occasional stronger flare-ups. With discipline, patients may get used to it and adapt.       When using our objective Pain Scale, levels between 6 and 10/10 are said to belong in an emergency room, as it progressively worsens from a 6/10, described as severely limiting, requiring emergency care not usually available at an outpatient pain management facility. At a 6/10 level, communication becomes difficult and requires great effort. Assistance to reach the emergency department may be required. Facial flushing and profuse sweating along with potentially dangerous increases in heart rate and blood pressure will be evident. Effect on ADL: able to do activities to an extent.  too much walking will flare hip pain.  when she flares she is unable to do much. a lot of sitting also makes it worse Timing: Constant Modifying factors: epsom salt baths, hot and cold alternately, pain meds, lidoderm patches, muscle relaxers help some   Onset and Duration: Date of onset: Worsen over time started approximately 5-6 years ago. Cause of pain: Unknown Severity: Getting worse Timing: Morning, Afternoon, Evening, During activity or exercise and After activity or exercise Aggravating Factors: Bending, climbing, lifting, sitting, standing, twisting, walking uphill Alleviating Factors: Cold packs, hot packs, lying down, medications, warm baths, chiropractic manipulations Associated Problems: Fatigue, pain does not let me sleep Quality of Pain: Aching, disabling, sharp, stabbing, uncomfortable Previous Examinations or Tests: MRI, x-rays, orthopedic evaluation, chiropractic evaluation, psychiatric evaluation Previous Treatments: Chiropractic manipulations, narcotic medications  The patient comes into the clinics today for the first time for a chronic pain  management evaluation.  41 year old female who presents with  axial low back pain more pronounced on the right side with radiation into her right hip and also intermittent right knee pain.  She states that pain is been going on for over 3 years.  She has obtained x-rays of her right knee along with x-ray and MRI of her lumbar spine.  Results of which are below.  Patient is tried physical therapy over 3 years ago which did provide her with significant benefit in regards to pain or range of motion for her right knee or right hip.  Patient is also tried chiropractic therapy which was not beneficial.  She had a duodenal switch approximately 4 years ago and has reduced her weight from 303 pounds to 175 pounds approximately.  She is unable to tolerate NSAID therapy.  She is referred by Dr. Manuella Ghazi.  Patient has been on concomitant Percocet 10 mg 3 times daily as needed along with alprazolam 1 mg twice daily as needed for her chronic pain and anxiety and depression symptoms.  Patient does have a history of anxiety with panic attacks along with major depressive disorder.  Patient is currently not on any opioid therapy or benzodiazepine therapy at the moment.  Today I took the time to provide the patient with information regarding my pain practice. The patient was informed that my practice is divided into two sections: an interventional pain management section, as well as a completely separate and distinct medication management section. I explained that I have procedure days for my interventional therapies, and evaluation days for follow-ups and medication management. Because of the amount of documentation required during both, they are kept separated. This means that there is the possibility that she may be scheduled for a procedure on one day, and medication management the next. I have also informed her that because of staffing and facility limitations, I no longer take patients for medication management only. To  illustrate the reasons for this, I gave the patient the example of surgeons, and how inappropriate it would be to refer a patient to his/her care, just to write for the post-surgical antibiotics on a surgery done by a different surgeon.   Because interventional pain management is my board-certified specialty, the patient was informed that joining my practice means that they are open to any and all interventional therapies. I made it clear that this does not mean that they will be forced to have any procedures done. What this means is that I believe interventional therapies to be essential part of the diagnosis and proper management of chronic pain conditions. Therefore, patients not interested in these interventional alternatives will be better served under the care of a different practitioner.  The patient was also made aware of my Comprehensive Pain Management Safety Guidelines where by joining my practice, they limit all of their nerve blocks and joint injections to those done by our practice, for as long as we are retained to manage their care.   Historic Controlled Substance Pharmacotherapy Review  PMP and historical list of controlled substances: History of Percocet 10 mg 3 times daily as needed, quantity 90, last filled 04/01/2017.  Alprazolam 1 mg, quantity 60, last filled 04/01/2017 MME/day: Approximately 45 mg/day Medications: The patient did not bring the medication(s) to the appointment, as requested in our "New Patient Package" Pharmacodynamics: Desired effects: Analgesia: The patient reports >50% benefit. Reported improvement in function: The patient reports medication allows her to accomplish basic ADLs. Clinically meaningful improvement in function (CMIF): Sustained CMIF goals met Perceived effectiveness: Described as relatively  effective, allowing for increase in activities of daily living (ADL) Undesirable effects: Side-effects or Adverse reactions: None reported Historical  Monitoring: The patient  reports that she does not use drugs. List of all UDS Test(s): Lab Results  Component Value Date   PCPQUANT Negative 09/25/2015   CANNABQUANT Negative 09/25/2015   List of other Serum/Urine Drug Screening Test(s):  Lab Results  Component Value Date   PCPQUANT Negative 09/25/2015   CANNABQUANT Negative 09/25/2015   Historical Background Evaluation: [place] PMP: Six (6) year initial data search conducted.             PMP NARX Score Report:  Narcotic: 480 Sedative: 480 Stimulant: 14 [place] Department of public safety, offender search: Editor, commissioning Information) Non-contributory Risk Assessment Profile: Aberrant behavior: None observed or detected today Risk factors for fatal opioid overdose: age 40-87 years old, Benzodiazepine use and caucasian PMP NARX Overdose Risk Score: 140 Fatal overdose hazard ratio (HR): Calculation deferred Non-fatal overdose hazard ratio (HR): Calculation deferred Risk of opioid abuse or dependence: 0.7-3.0% with doses ? 36 MME/day and 6.1-26% with doses ? 120 MME/day. Substance use disorder (SUD) risk level: Moderate Opioid risk tool (ORT) (Total Score): 5 Opioid Risk Tool - 05/06/17 0924      Family History of Substance Abuse   Alcohol  Negative    Illegal Drugs  Positive Female mother is recovering addict   mother is recovering addict   Rx Drugs  Negative      Personal History of Substance Abuse   Alcohol  Negative    Illegal Drugs  Negative    Rx Drugs  Negative      Psychological Disease   Psychological Disease  Positive    ADD  Negative    OCD  Negative    Bipolar  Negative    Schizophrenia  Negative    Depression  Positive taking effexor and is helping panic attacks   taking effexor and is helping panic attacks     Total Score   Opioid Risk Tool Scoring  5    Opioid Risk Interpretation  Moderate Risk      ORT Scoring interpretation table:  Score <3 = Low Risk for SUD  Score between 4-7 = Moderate Risk for SUD  Score  >8 = High Risk for Opioid Abuse   Score 20-27 = Severe depression (2.4 times higher risk of SUD and 2.89 times higher risk of overuse)   Pharmacologic Plan: No further testing ordered.            Initial impression: Poor candidate for opioid analgesics.  Meds   Current Outpatient Medications:  .  ALPRAZolam (XANAX) 1 MG tablet, Take 1 tablet (1 mg total) by mouth 2 (two) times daily as needed for anxiety., Disp: 60 tablet, Rfl: 0 .  glucose blood (ACCU-CHEK AVIVA PLUS) test strip, 1 strip by Does not apply route daily., Disp: , Rfl:  .  lidocaine (LIDODERM) 5 %, APPLY 1 PATCH TOPICALLY DAILY LEAVE ON 12 HOURS THEN OFF 12 HOURS, Disp: 30 patch, Rfl: 0 .  pantoprazole (PROTONIX) 40 MG tablet, TAKE ONE TABLET TWICE DAILY, Disp: 180 tablet, Rfl: 1 .  Venlafaxine HCl 75 MG TB24, Take 1 tablet (75 mg total) by mouth daily., Disp: 30 each, Rfl: 2 .  Vitamin D, Ergocalciferol, (DRISDOL) 50000 units CAPS capsule, Take 1 capsule by mouth 2 (two) times a week., Disp: , Rfl:  .  cyclobenzaprine (FLEXERIL) 10 MG tablet, Take 1 tablet (10 mg total) by mouth 3 (three)  times daily as needed for muscle spasms., Disp: 90 tablet, Rfl: 2 .  gabapentin (NEURONTIN) 300 MG capsule, 300 mg qhs x 2 weeks, then 300 mg BID x 2 weeks, then 300 mg TID, Disp: 90 capsule, Rfl: 2 .  metaxalone (SKELAXIN) 800 MG tablet, Take 1 tablet (800 mg total) by mouth 3 (three) times daily. (Patient not taking: Reported on 05/06/2017), Disp: 21 tablet, Rfl: 0 .  methylPREDNISolone (MEDROL DOSEPAK) 4 MG TBPK tablet, Take per package instructions (Patient not taking: Reported on 04/01/2017), Disp: 21 tablet, Rfl: 0 .  oxyCODONE-acetaminophen (PERCOCET) 10-325 MG tablet, Take 1 tablet by mouth 3 (three) times daily as needed. (Patient not taking: Reported on 05/06/2017), Disp: 90 tablet, Rfl: 0 .  tiZANidine (ZANAFLEX) 2 MG tablet, Take 1 tablet (2 mg total) by mouth every 8 (eight) hours as needed for muscle spasms. (Patient not taking:  Reported on 04/01/2017), Disp: 90 tablet, Rfl: 0  Current Facility-Administered Medications:  .  fentaNYL (SUBLIMAZE) injection 100 mcg, 100 mcg, Intravenous, Once, Molli Barrows, MD .  iopamidol (ISOVUE-M) 41 % intrathecal injection 20 mL, 20 mL, Other, Once PRN, Molli Barrows, MD .  iopamidol (ISOVUE-M) 41 % intrathecal injection 20 mL, 20 mL, Other, Once PRN, Molli Barrows, MD .  lactated ringers infusion 1,000 mL, 1,000 mL, Intravenous, Continuous, Adams, Alvina Filbert, MD .  lactated ringers infusion 1,000 mL, 1,000 mL, Intravenous, Continuous, Adams, Alvina Filbert, MD .  midazolam (VERSED) 5 MG/5ML injection 5 mg, 5 mg, Intravenous, Once, Molli Barrows, MD .  midazolam (VERSED) injection 5 mg, 5 mg, Intravenous, Once, Molli Barrows, MD .  ropivacaine (PF) 2 mg/ml (0.2%) (NAROPIN) epidural 10 mL, 10 mL, Epidural, Once, Molli Barrows, MD .  triamcinolone acetonide (KENALOG-40) injection 40 mg, 40 mg, Other, Once, Molli Barrows, MD  Imaging Review   Lumbosacral Imaging: Lumbar MR wo contrast:  Results for orders placed during the hospital encounter of 01/02/06  MR Lumbar Spine Wo Contrast   Narrative Clinical Data: 41 year old with back pain.   MRI LUMBAR SPINE WITHOUT CONTRAST:  Technique: Multiplanar and multiecho pulse sequences of the lumbar spine, to include the lower thoracic and upper sacral regions, were obtained according to standard protocol without IV contrast.  Comparison: None.   Findings: Sagittal MR images demonstrate normal overall alignment of the lumbar vertebral bodies. The vertebral bodies demonstrate normal marrow signal. The intervertebral disc spaces are maintained with fairly normal T2- signal intensity except for mild desiccation at L5-S1. The conus medullaris terminates at the bottom of L-1.   No focal disc protrusions, spinal or foraminal stenosis in the lumbar spine. There are minimal facet degenerative changes but no pars defects.   IMPRESSION:  1. Mild disc  desiccation L5-S1 but no focal disc protrusions, spinal or foraminal stenosis.   2. Minimal facet hypertrophic changes but no pars defects.  Provider: Weyman Rodney      Results for orders placed during the hospital encounter of 03/27/16  DG HIP UNILAT W OR W/O PELVIS 2-3 VIEWS RIGHT   Narrative CLINICAL DATA:  Pt states she had an injection in her lower back in August and then started having pain anterior right hip in groin area right after injection. No injury.  EXAM: DG HIP (WITH OR WITHOUT PELVIS) 2-3V RIGHT  COMPARISON:  None.  FINDINGS: There is no evidence of hip fracture or dislocation. There is no evidence of arthropathy or other focal bone abnormality.  IMPRESSION: Negative.   Electronically  Signed   By: Lajean Manes M.D.   On: 03/27/2016 10:33      Complexity Note: Imaging results reviewed. Results shared with Ms. Sharen Counter, using Layman's terms.                         ROS  Cardiovascular History: No reported cardiovascular signs or symptoms such as High blood pressure, coronary artery disease, abnormal heart rate or rhythm, heart attack, blood thinner therapy or heart weakness and/or failure Pulmonary or Respiratory History: No reported pulmonary signs or symptoms such as wheezing and difficulty taking a deep full breath (Asthma), difficulty blowing air out (Emphysema), coughing up mucus (Bronchitis), persistent dry cough, or temporary stoppage of breathing during sleep Neurological History: No reported neurological signs or symptoms such as seizures, abnormal skin sensations, urinary and/or fecal incontinence, being born with an abnormal open spine and/or a tethered spinal cord Review of Past Neurological Studies: No results found for this or any previous visit. Psychological-Psychiatric History: Psychiatric disorder, Anxiousness, Depressed, Prone to panicking and Difficulty sleeping and or falling asleep Gastrointestinal History: Reflux or heatburn Genitourinary  History: No reported renal or genitourinary signs or symptoms such as difficulty voiding or producing urine, peeing blood, non-functioning kidney, kidney stones, difficulty emptying the bladder, difficulty controlling the flow of urine, or chronic kidney disease Hematological History: No reported hematological signs or symptoms such as prolonged bleeding, low or poor functioning platelets, bruising or bleeding easily, hereditary bleeding problems, low energy levels due to low hemoglobin or being anemic Endocrine History: No reported endocrine signs or symptoms such as high or low blood sugar, rapid heart rate due to high thyroid levels, obesity or weight gain due to slow thyroid or thyroid disease Rheumatologic History: No reported rheumatological signs and symptoms such as fatigue, joint pain, tenderness, swelling, redness, heat, stiffness, decreased range of motion, with or without associated rash Musculoskeletal History: Negative for myasthenia gravis, muscular dystrophy, multiple sclerosis or malignant hyperthermia Work History: Working full time patient is an Glass blower/designer and works full-time.    Allergies  Ms. Zullo is allergic to 2,4-d dimethylamine (amisol); bee venom; nsaids; other; tetanus toxoid adsorbed; and tetanus toxoids.  Laboratory Chemistry  Inflammation Markers (CRP: Acute Phase) (ESR: Chronic Phase) No results found for: CRP, ESRSEDRATE, LATICACIDVEN               Rheumatology Markers No results found for: RF, ANA, Therisa Doyne, [facility]              Renal Function Markers Lab Results  Component Value Date   BUN 8 10/03/2016   CREATININE 0.64 10/03/2016   GFRAA >89 10/03/2016   GFRNONAA >89 10/03/2016                 Hepatic Function Markers Lab Results  Component Value Date   AST 19 10/03/2016   ALT 24 10/03/2016   ALBUMIN 4.0 10/03/2016   ALKPHOS 66 10/03/2016                 Electrolytes Lab Results  Component Value Date   NA 139  10/03/2016   K 4.5 10/03/2016   CL 108 10/03/2016   CALCIUM 8.4 (L) 10/03/2016   CALCIUM 8.4 (L) 10/03/2016   MG 2.0 12/27/2014                 Neuropathy Markers Lab Results  Component Value Date   VITAMINB12 512 09/04/2016   HGBA1C 4.6 11/30/2015  Bone Pathology Markers Lab Results  Component Value Date   VD25OH 17 (L) 09/04/2016                 Coagulation Parameters Lab Results  Component Value Date   PLT 210 09/22/2014                 Cardiovascular Markers Lab Results  Component Value Date   HGB 13.5 09/22/2014   HCT 40 09/22/2014                 CA Markers No results found for: CEA, CA125, LABCA2               Note: Lab results reviewed.  [place]  Drug: Ms. Fleissner  reports that she does not use drugs. Alcohol:  reports that she does not drink alcohol. Tobacco:  reports that  has never smoked. she has never used smokeless tobacco. Medical:  has a past medical history of Anxiety, Asthma, Bipolar disorder ([place]), Chronic fatigue, Chronic low back pain, Diabetes mellitus without complication ([place]), Fatty liver disease, nonalcoholic, History of Helicobacter pylori infection, Hyperlipidemia, Hypertension, Insomnia, Morbid obesity ([place]), Panic disorder, PTSD (post-traumatic stress disorder), Vitamin B12 deficiency, and Vitamin D deficiency. Family: family history includes Anxiety disorder in her sister; Depression in her sister; Drug abuse in her mother; Heart disease in her father; Hyperlipidemia in her father; Hypertension in her father.  Past Surgical History:  Procedure Laterality Date  . ABDOMINAL HYSTERECTOMY    . GASTROPLASTY DUODENAL SWITCH    . PARTIAL HYSTERECTOMY    . SALPINGECTOMY    . TUBAL LIGATION     Active Ambulatory Problems    Diagnosis Date Noted  . Chest discomfort 05/05/2013  . Panic disorder with agoraphobia 11/24/2014  . Anxiety and depression 11/24/2014  . Arthralgia of lower leg 11/24/2014  . Back pain with radiation  11/24/2014  . H/O surgical procedure 04/18/2014  . Continuous opioid dependence ([place]) 11/24/2014  . GERD (gastroesophageal reflux disease) 11/24/2014  . Chronic low back pain without sciatica 11/24/2014  . Panic disorder with agoraphobia and moderate panic attacks 11/24/2014  . Arthritis of right knee 01/24/2015  . Motion sickness 01/24/2015  . Vitamin D insufficiency 06/06/2015  . Enteritis 07/06/2015  . Vitamin B12 deficiency 11/30/2015  . History of diabetes mellitus, type II 11/30/2015  . Insect bite of thigh, infected, right, subsequent encounter 05/02/2016  . Antibiotic-induced yeast infection 05/02/2016  . S/P gastric bypass 09/04/2016  . Hypoparathyroidism ([place]) 10/08/2016   Resolved Ambulatory Problems    Diagnosis Date Noted  . Morbid obesity ([place]) 05/05/2013  . Lumbar radiculopathy 11/24/2014  . Cramp of limb 11/24/2014  . Malaise and fatigue 11/24/2014  . Diarrhea of infectious origin 12/27/2014  . Hypokalemia, gastrointestinal losses 12/27/2014  . Hypomagnesemia 12/27/2014   Past Medical History:  Diagnosis Date  . Anxiety   . Asthma   . Bipolar disorder ([place])   . Chronic fatigue   . Chronic low back pain   . Diabetes mellitus without complication ([place])   . Fatty liver disease, nonalcoholic   . History of Helicobacter pylori infection   . Hyperlipidemia   . Hypertension   . Insomnia   . Morbid obesity ([place])   . Panic disorder   . PTSD (post-traumatic stress disorder)   . Vitamin B12 deficiency   . Vitamin D deficiency    Constitutional Exam  General appearance: Well nourished, well developed, and well hydrated. In no apparent acute distress Vitals:  05/06/17 0914  BP: 120/78  Pulse: 78  Resp: 16  Temp: 98.3 F (36.8 C)  TempSrc: Oral  SpO2: 100%  Weight: 189 lb (85.7 kg)  Height: 5' 10"  (1.778 m)   BMI Assessment: Estimated body mass index is 27.12 kg/m as calculated from the following:   Height as of this encounter: 5' 10"  (1.778 m).    Weight as of this encounter: 189 lb (85.7 kg).  BMI interpretation table: BMI level Category Range association with higher incidence of chronic pain  <18 kg/m2 Underweight   18.5-24.9 kg/m2 Ideal body weight   25-29.9 kg/m2 Overweight Increased incidence by 20%  30-34.9 kg/m2 Obese (Class I) Increased incidence by 68%  35-39.9 kg/m2 Severe obesity (Class II) Increased incidence by 136%  >40 kg/m2 Extreme obesity (Class III) Increased incidence by 254%   BMI Readings from Last 4 Encounters:  05/06/17 27.12 kg/m  04/01/17 27.98 kg/m  03/18/17 26.08 kg/m  02/27/17 27.81 kg/m   Wt Readings from Last 4 Encounters:  05/06/17 189 lb (85.7 kg)  04/01/17 189 lb 8 oz (86 kg)  03/18/17 187 lb (84.8 kg)  02/27/17 193 lb 12.8 oz (87.9 kg)  Psych/Mental status: Alert, oriented x 3 (person, place, & time)       Eyes: PERLA Respiratory: No evidence of acute respiratory distress  Cervical Spine Area Exam  Skin & Axial Inspection: No masses, redness, edema, swelling, or associated skin lesions Alignment: Symmetrical Functional ROM: Unrestricted ROM      Stability: No instability detected Muscle Tone/Strength: Functionally intact. No obvious neuro-muscular anomalies detected. Sensory (Neurological): Unimpaired Palpation: No palpable anomalies              Upper Extremity (UE) Exam    Side: Right upper extremity  Side: Left upper extremity  Skin & Extremity Inspection: Skin color, temperature, and hair growth are WNL. No peripheral edema or cyanosis. No masses, redness, swelling, asymmetry, or associated skin lesions. No contractures.  Skin & Extremity Inspection: Skin color, temperature, and hair growth are WNL. No peripheral edema or cyanosis. No masses, redness, swelling, asymmetry, or associated skin lesions. No contractures.  Functional ROM: Unrestricted ROM          Functional ROM: Unrestricted ROM          Muscle Tone/Strength: Functionally intact. No obvious neuro-muscular anomalies  detected.  Muscle Tone/Strength: Functionally intact. No obvious neuro-muscular anomalies detected.  Sensory (Neurological): Unimpaired          Sensory (Neurological): Unimpaired          Palpation: No palpable anomalies              Palpation: No palpable anomalies              Specialized Test(s): Deferred         Specialized Test(s): Deferred          Thoracic Spine Area Exam  Skin & Axial Inspection: No masses, redness, or swelling Alignment: Symmetrical Functional ROM: Unrestricted ROM Stability: No instability detected Muscle Tone/Strength: Functionally intact. No obvious neuro-muscular anomalies detected. Sensory (Neurological): Unimpaired Muscle strength & Tone: No palpable anomalies  Lumbar Spine Area Exam  Skin & Axial Inspection: No masses, redness, or swelling Alignment: Symmetrical Functional ROM: Unrestricted ROM      Stability: No instability detected Muscle Tone/Strength: Functionally intact. No obvious neuro-muscular anomalies detected. Sensory (Neurological): Unimpaired Palpation: No palpable anomalies       Provocative Tests: Lumbar Hyperextension and rotation test: Positive bilaterally for facet joint  pain. Lumbar Lateral bending test: Positive due to pain.right greater than left Patrick's Maneuver: evaluation deferred today                    Gait & Posture Assessment  Ambulation: Unassisted Gait: Relatively normal for age and body habitus Posture: WNL   Lower Extremity Exam    Side: Right lower extremity  Side: Left lower extremity  Skin & Extremity Inspection: Skin color, temperature, and hair growth are WNL. No peripheral edema or cyanosis. No masses, redness, swelling, asymmetry, or associated skin lesions. No contractures.  Skin & Extremity Inspection: Skin color, temperature, and hair growth are WNL. No peripheral edema or cyanosis. No masses, redness, swelling, asymmetry, or associated skin lesions. No contractures.  Functional ROM: Unrestricted ROM           Functional ROM: Unrestricted ROM          Muscle Tone/Strength: Functionally intact. No obvious neuro-muscular anomalies detected.  Muscle Tone/Strength: Functionally intact. No obvious neuro-muscular anomalies detected.  Sensory (Neurological): Unimpaired  Sensory (Neurological): Unimpaired  Palpation: Complains of area being tender to palpation right knee  Palpation: No palpable anomalies   Assessment  Primary Diagnosis & Pertinent Problem List: The primary encounter diagnosis was Chronic pain syndrome. Diagnoses of Myofascial pain, Chronic pain of right knee, Hip pain, chronic, right, and Anxiety and depression were also pertinent to this visit.  Visit Diagnosis (New problems to examiner): 1. Chronic pain syndrome   2. Myofascial pain   3. Chronic pain of right knee   4. Hip pain, chronic, right   5. Anxiety and depression    General Recommendations: The pain condition that the patient suffers from is best treated with a multidisciplinary approach that involves an increase in physical activity to prevent de-conditioning and worsening of the pain cycle, as well as psychological counseling (formal and/or informal) to address the co-morbid psychological affects of pain. Treatment will often involve judicious use of pain medications and interventional procedures to decrease the pain, allowing the patient to participate in the physical activity that will ultimately produce long-lasting pain reductions. The goal of the multidisciplinary approach is to return the patient to a higher level of overall function and to restore their ability to perform activities of daily living.  The patient will not be a candidate for opioid therapy through this clinic. Patient will be optimized on non-opioid adjunctive analgesics and a comprehensive pain management plan was developed.  41 year old female with a history of chronic pain of her axial lumbar spine that radiates to right hip and also intermittent right  knee pain that has been going on for over 3 years.  Patient also has a history of depression, anxiety, panic disorder, history of morbid obesity status post gastric bypass surgery with duodenal switch approximately 4 years ago with weight loss from 303-175 pounds.  I reviewed the patient's lumbar MRI, right knee x-ray.  These are largely unremarkable.  No significant pathology noted.  Patient does have very mild degenerative facet disease and mild hypertrophy of lumbar facets at L4-L5 and L5-S1.  Her right knee x-ray is largely unremarkable.  Her current imaging studies do not show any evidence of neural compression or acute fractures.  It is likely that the patient's pain could be secondary to a myofascial process, fibromyalgia, amplification of her psychosomatic complaints secondary to poor psychological coping.  I had an extensive discussion with the patient that opioid therapy would not be a part of her treatment plan.  Patient  has been receiving Xanax and oxycodone 10 mg by her primary care physician.  For the long-term, chronic benzodiazepine and opioid therapy will not be effective for the patient's chronic pain condition.  We had an extensive discussion about non-opioid analgesics, psychotherapy, physical therapy to assist with her chronic pain state.  Patient was receptive to this.    I will referred the patient for psychiatric consultation in regards to her depression, anxiety, panic attacks.  We will also start the patient on gabapentin 300 mg 3 times daily for her low back and leg pain symptoms.  I have instructed her to increase to 300 mg 3 times daily in a graduated fashion.  Patient also finds Flexeril effective for her muscle spasms.  I will prescribe her 10 mg 3 times daily as needed.  Patient to follow-up in 6 weeks.   Plan: -Referral to psychiatry for management of depression, anxiety, panic disorder -Gabapentin 300 mg 3 times daily as needed in a graduated fashion as detailed below in  AVS -Flexeril 10 mg 3 times daily as needed muscle spasms -Follow-up in 6 weeks.  Time Note: Greater than 50% of the 45 minute(s) of face-to-face time spent with Ms. Tates, was spent in counseling/coordination of care regarding: the treatment plan, the risks and possible complications of proposed treatment and the opioid analgesic risks and possible complications.  Ordered Lab-work, Procedure(s), Referral(s), & Consult(s): Orders Placed This Encounter  Procedures  . Ambulatory referral to Psychiatry   Pharmacotherapy (current): Medications ordered:  Meds ordered this encounter  Medications  . cyclobenzaprine (FLEXERIL) 10 MG tablet    Sig: Take 1 tablet (10 mg total) by mouth 3 (three) times daily as needed for muscle spasms.    Dispense:  90 tablet    Refill:  2    Do not place this medication, or any other prescription from our practice, on "Automatic Refill". Patient may have prescription filled one day early if pharmacy is closed on scheduled refill date.  . gabapentin (NEURONTIN) 300 MG capsule    Sig: 300 mg qhs x 2 weeks, then 300 mg BID x 2 weeks, then 300 mg TID    Dispense:  90 capsule    Refill:  2    Do not place this medication, or any other prescription from our practice, on "Automatic Refill". Patient may have prescription filled one day early if pharmacy is closed on scheduled refill date.   Medications administered during this visit: Jamile A. Depolo had no medications administered during this visit.   Future considerations: Increase gabapentin, Lyrica, trial moderate muscle relaxants including tizanidine, Robaxin.  Can consider adding Cymbalta, amitriptyline.  Provider-requested follow-up: Return in about 6 weeks (around 06/17/2017) for Medication Management.  Future Appointments  Date Time Provider [place]  06/17/2017  9:30 AM Gillis Santa, MD [facility] None  07/02/2017  9:40 AM Roselee Nova, MD [facility]    Primary Care Physician: Roselee Nova, MD Location: [facility] Note by: Gillis Santa, M.D, Date: 05/06/2017; Time: 12:54 PM  Patient Instructions  1.  Referral to psychiatry for management of depression and anxiety 2.  Gabapentin 300 mg nightly for 2 weeks then 300 mg twice daily for 2 weeks then 200 mg 3 times a day. 3.  Flexeril 10 mill grams 3 times daily as needed muscle spasms 4.  Follow-up in 6 weeks.

## 2017-05-06 NOTE — Telephone Encounter (Signed)
Spoke to patient and she states  She rather have a referral to [facility]. They have do pain management and she can be seen for her knee as well.

## 2017-05-06 NOTE — Progress Notes (Signed)
Safety precautions to be maintained throughout the outpatient stay will include: orient to surroundings, keep bed in low position, maintain call bell within reach at all times, provide assistance with transfer out of bed and ambulation.  

## 2017-05-06 NOTE — Telephone Encounter (Signed)
Pt was referred to the pain clinic (at the hospital) and is asking for you to send her somewhere else. She does not wish to see that doctor.

## 2017-05-06 NOTE — Telephone Encounter (Signed)
Patient is welcome to try:  Dr. Nathanial Rancher [facility] [place], [place], [place] [address]  P: [phone]

## 2017-05-06 NOTE — Telephone Encounter (Signed)
Referral to [facility]ting is provided

## 2017-05-21 NOTE — Telephone Encounter (Signed)
Referral was sent to [facility] on 05/08/17

## 2017-05-28 DIAGNOSIS — M545 Low back pain: Secondary | ICD-10-CM | POA: Diagnosis not present

## 2017-05-30 ENCOUNTER — Other Ambulatory Visit: Payer: Self-pay | Admitting: Specialist

## 2017-05-30 DIAGNOSIS — M545 Low back pain: Secondary | ICD-10-CM

## 2017-06-06 ENCOUNTER — Ambulatory Visit
Admission: RE | Admit: 2017-06-06 | Discharge: 2017-06-06 | Disposition: A | Discharge status: Home / Self Care | Payer: BLUE CROSS/BLUE SHIELD | Source: Ambulatory Visit | Attending: Specialist | Admitting: Specialist

## 2017-06-17 ENCOUNTER — Encounter: Payer: Medicaid Other | Admitting: Student in an Organized Health Care Education/Training Program

## 2017-07-02 ENCOUNTER — Ambulatory Visit: Payer: Self-pay | Admitting: Family Medicine

## 2017-07-19 ENCOUNTER — Other Ambulatory Visit: Payer: Self-pay | Admitting: Family Medicine

## 2017-07-19 DIAGNOSIS — G8929 Other chronic pain: Secondary | ICD-10-CM

## 2017-07-19 DIAGNOSIS — M545 Low back pain: Secondary | ICD-10-CM

## 2017-07-19 DIAGNOSIS — M25561 Pain in right knee: Principal | ICD-10-CM

## 2017-07-21 NOTE — Telephone Encounter (Signed)
Last OV:04/01/2017 Last refill Venlafaxine HCl 75 MG TB24 [[id number]]  Order Details  Dose: 1 tablet Route: Oral Frequency: Daily  Dispense Quantity: 30 each Refills: 2 Fills remaining: --        Sig: Take 1 tablet (75 mg total) by mouth daily.       Written Date: 04/01/17 Expiration Date: 04/01/18    Start Date: 04/01/17 End Date: --         lidocaine (LIDODERM) 5 % [[id number]]  Order Details  Dose, Route, Frequency: As Directed   Dispense Quantity: 30 patch Refills: 0 Fills remaining: --        Sig: APPLY 1 PATCH TOPICALLY DAILY LEAVE ON 12 HOURS THEN OFF 12 HOURS       Written Date: 04/07/17 Expiration Date: 04/07/18    Start Date: 04/07/17 End Date: --

## 2017-07-21 NOTE — Telephone Encounter (Signed)
Needs follow up, I am sending 30 days of Venlafaxine , denied lidoderm

## 2017-07-22 NOTE — Telephone Encounter (Signed)
Left voice message on [phone] @ 7:47 to schedule appt with Dr Ancil Boozer also informed that one script has been called into pharmacy.

## 2017-08-29 ENCOUNTER — Ambulatory Visit
Admission: RE | Admit: 2017-08-29 | Discharge: 2017-08-29 | Disposition: A | Discharge status: Home / Self Care | Payer: BLUE CROSS/BLUE SHIELD | Source: Ambulatory Visit | Attending: Specialist | Admitting: Specialist

## 2017-08-29 DIAGNOSIS — M5126 Other intervertebral disc displacement, lumbar region: Secondary | ICD-10-CM | POA: Diagnosis not present

## 2017-08-29 DIAGNOSIS — M545 Low back pain: Secondary | ICD-10-CM | POA: Diagnosis not present

## 2017-08-29 DIAGNOSIS — M5127 Other intervertebral disc displacement, lumbosacral region: Secondary | ICD-10-CM | POA: Insufficient documentation

## 2017-09-02 DIAGNOSIS — M5136 Other intervertebral disc degeneration, lumbar region: Secondary | ICD-10-CM | POA: Insufficient documentation

## 2017-09-04 DIAGNOSIS — M2530 Other instability, unspecified joint: Secondary | ICD-10-CM | POA: Diagnosis not present

## 2017-09-04 DIAGNOSIS — M5136 Other intervertebral disc degeneration, lumbar region: Secondary | ICD-10-CM | POA: Diagnosis not present

## 2017-09-04 DIAGNOSIS — M545 Low back pain: Secondary | ICD-10-CM | POA: Diagnosis not present

## 2017-09-08 ENCOUNTER — Ambulatory Visit (INDEPENDENT_AMBULATORY_CARE_PROVIDER_SITE_OTHER): Payer: BLUE CROSS/BLUE SHIELD | Admitting: Family Medicine

## 2017-09-08 ENCOUNTER — Encounter: Payer: Self-pay | Admitting: Family Medicine

## 2017-09-08 VITALS — BP 128/82 | HR 81 | Temp 98.3°F | Resp 14 | Ht 70.0 in | Wt 203.0 lb

## 2017-09-08 DIAGNOSIS — Z62810 Personal history of physical and sexual abuse in childhood: Secondary | ICD-10-CM | POA: Diagnosis not present

## 2017-09-08 DIAGNOSIS — G5600 Carpal tunnel syndrome, unspecified upper limb: Secondary | ICD-10-CM | POA: Insufficient documentation

## 2017-09-08 DIAGNOSIS — M545 Low back pain: Secondary | ICD-10-CM

## 2017-09-08 DIAGNOSIS — G8929 Other chronic pain: Secondary | ICD-10-CM

## 2017-09-08 DIAGNOSIS — F4310 Post-traumatic stress disorder, unspecified: Secondary | ICD-10-CM

## 2017-09-08 DIAGNOSIS — E786 Lipoprotein deficiency: Secondary | ICD-10-CM

## 2017-09-08 DIAGNOSIS — K219 Gastro-esophageal reflux disease without esophagitis: Secondary | ICD-10-CM

## 2017-09-08 DIAGNOSIS — M25561 Pain in right knee: Secondary | ICD-10-CM

## 2017-09-08 DIAGNOSIS — Z114 Encounter for screening for human immunodeficiency virus [HIV]: Secondary | ICD-10-CM | POA: Diagnosis not present

## 2017-09-08 DIAGNOSIS — E559 Vitamin D deficiency, unspecified: Secondary | ICD-10-CM

## 2017-09-08 DIAGNOSIS — Z8639 Personal history of other endocrine, nutritional and metabolic disease: Secondary | ICD-10-CM

## 2017-09-08 DIAGNOSIS — F3132 Bipolar disorder, current episode depressed, moderate: Secondary | ICD-10-CM

## 2017-09-08 DIAGNOSIS — K2930 Chronic superficial gastritis without bleeding: Secondary | ICD-10-CM

## 2017-09-08 DIAGNOSIS — K760 Fatty (change of) liver, not elsewhere classified: Secondary | ICD-10-CM | POA: Insufficient documentation

## 2017-09-08 DIAGNOSIS — F319 Bipolar disorder, unspecified: Secondary | ICD-10-CM | POA: Insufficient documentation

## 2017-09-08 DIAGNOSIS — Z9884 Bariatric surgery status: Secondary | ICD-10-CM

## 2017-09-08 MED ORDER — RANITIDINE HCL 150 MG PO TABS: 150.0000 mg | ORAL_TABLET | Freq: Every day | ORAL | 1 refills | Status: DC

## 2017-09-08 MED ORDER — LIDOCAINE 5 % EX PTCH: 1.0000 | MEDICATED_PATCH | CUTANEOUS | 1 refills | Status: DC

## 2017-09-08 MED ORDER — VITAMIN D (ERGOCALCIFEROL) 1.25 MG (50000 UNIT) PO CAPS: 50000.0000 [IU] | ORAL_CAPSULE | ORAL | 1 refills | Status: DC

## 2017-09-08 MED ORDER — PANTOPRAZOLE SODIUM 40 MG PO TBEC: 40.0000 mg | DELAYED_RELEASE_TABLET | Freq: Every day | ORAL | 1 refills | Status: DC
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
## 2017-09-09 LAB — COMPLETE METABOLIC PANEL WITH GFR
AG Ratio: 1.6 (calc) (ref 1.0–2.5)
ALBUMIN MSPROF: 4.2 g/dL (ref 3.6–5.1)
ALT: 28 U/L (ref 6–29)
AST: 20 U/L (ref 10–30)
Alkaline phosphatase (APISO): 74 U/L (ref 33–115)
BUN: 10 mg/dL (ref 7–25)
CO2: 24 mmol/L (ref 20–32)
CREATININE: 0.56 mg/dL (ref 0.50–1.10)
Calcium: 9 mg/dL (ref 8.6–10.2)
Chloride: 106 mmol/L (ref 98–110)
GFR, EST AFRICAN AMERICAN: 136 mL/min/{1.73_m2} (ref >=60)
GFR, Est Non African American: 117 mL/min/{1.73_m2} (ref >=60)
GLUCOSE: 81 mg/dL (ref 65–139)
Globulin: 2.6 g/dL (calc) (ref 1.9–3.7)
Potassium: 4.3 mmol/L (ref 3.5–5.3)
Sodium: 138 mmol/L (ref 135–146)
TOTAL PROTEIN: 6.8 g/dL (ref 6.1–8.1)
Total Bilirubin: 0.5 mg/dL (ref 0.2–1.2)

## 2017-09-09 LAB — CBC WITH DIFFERENTIAL/PLATELET
BASOS PCT: 0.5 %
Basophils Absolute: 28 cells/uL (ref 0–200)
EOS PCT: 1.1 %
Eosinophils Absolute: 62 cells/uL (ref 15–500)
HCT: 37.3 % (ref 35.0–45.0)
HEMOGLOBIN: 12.5 g/dL (ref 11.7–15.5)
Lymphs Abs: 1730 cells/uL (ref 850–3900)
MCH: 30.1 pg (ref 27.0–33.0)
MCHC: 33.5 g/dL (ref 32.0–36.0)
MCV: 89.9 fL (ref 80.0–100.0)
MPV: 11.3 fL (ref 7.5–12.5)
Monocytes Relative: 5.7 %
NEUTROS ABS: 3461 {cells}/uL (ref 1500–7800)
Neutrophils Relative %: 61.8 %
Platelets: 131 10*3/uL — ABNORMAL LOW (ref 140–400)
RBC: 4.15 10*6/uL (ref 3.80–5.10)
RDW: 12.2 % (ref 11.0–15.0)
Total Lymphocyte: 30.9 %
WBC mixed population: 319 cells/uL (ref 200–950)
WBC: 5.6 10*3/uL (ref 3.8–10.8)

## 2017-09-09 LAB — LIPID PANEL
CHOL/HDL RATIO: 3.2 (calc) (ref <5.0)
CHOLESTEROL: 156 mg/dL (ref <200)
HDL: 49 mg/dL — ABNORMAL LOW (ref >50)
LDL CHOLESTEROL (CALC): 88 mg/dL
Non-HDL Cholesterol (Calc): 107 mg/dL (calc) (ref <130)
TRIGLYCERIDES: 98 mg/dL (ref <150)

## 2017-09-09 LAB — HEMOGLOBIN A1C
EAG (MMOL/L): 4.6 (calc)
Hgb A1c MFr Bld: 4.5 % of total Hgb (ref <5.7)
Mean Plasma Glucose: 82 (calc)

## 2017-09-09 LAB — VITAMIN B12: VITAMIN B 12: 802 pg/mL (ref 200–1100)

## 2017-09-09 LAB — HIV ANTIBODY (ROUTINE TESTING W REFLEX): HIV 1&2 Ab, 4th Generation: NONREACTIVE

## 2017-09-09 LAB — VITAMIN D 25 HYDROXY (VIT D DEFICIENCY, FRACTURES): VIT D 25 HYDROXY: 19 ng/mL — AB (ref 30–100)

## 2017-09-15 ENCOUNTER — Other Ambulatory Visit: Payer: Self-pay

## 2017-09-15 NOTE — Telephone Encounter (Signed)
Last note reviewed; states patient is weaning herself off and is going to be seeing a new psychiatrist or therapist Please call patient; find out if she has another prescriber yet for her effexor If so, then please get refills from that provider If not, find out how soon, and if she is wanting to step down to 37.5 mg (the next dose if weaning); verify no HI or SI Thank you

## 2017-09-15 NOTE — Telephone Encounter (Signed)
Refill request for general medication: Venla faxine HCL ER 37.5 mg  Last office visit: 09/08/2017  Last physical exam: None indicated  Follow-ups on file. 03/11/2018

## 2017-09-16 DIAGNOSIS — M25551 Pain in right hip: Secondary | ICD-10-CM | POA: Diagnosis not present

## 2017-09-16 DIAGNOSIS — M545 Low back pain: Secondary | ICD-10-CM | POA: Diagnosis not present

## 2017-09-16 NOTE — Telephone Encounter (Signed)
Patient does not use [facility] representative. She is weaning off of this medication. She has an appointment to see psychiatrist on May 3 and does not need any medications at this time.

## 2017-10-03 DIAGNOSIS — M545 Low back pain: Secondary | ICD-10-CM | POA: Diagnosis not present

## 2017-10-03 DIAGNOSIS — M25551 Pain in right hip: Secondary | ICD-10-CM | POA: Diagnosis not present

## 2017-10-13 DIAGNOSIS — M5136 Other intervertebral disc degeneration, lumbar region: Secondary | ICD-10-CM | POA: Diagnosis not present

## 2017-10-13 DIAGNOSIS — M2530 Other instability, unspecified joint: Secondary | ICD-10-CM | POA: Diagnosis not present

## 2017-10-13 DIAGNOSIS — M545 Low back pain: Secondary | ICD-10-CM | POA: Diagnosis not present

## 2017-12-08 ENCOUNTER — Other Ambulatory Visit: Payer: Self-pay | Admitting: Family Medicine

## 2017-12-08 ENCOUNTER — Encounter: Payer: Self-pay | Admitting: Family Medicine

## 2018-01-14 ENCOUNTER — Ambulatory Visit (INDEPENDENT_AMBULATORY_CARE_PROVIDER_SITE_OTHER): Payer: BLUE CROSS/BLUE SHIELD | Admitting: Obstetrics and Gynecology

## 2018-01-14 ENCOUNTER — Encounter

## 2018-01-14 ENCOUNTER — Encounter: Payer: Self-pay | Admitting: Obstetrics and Gynecology

## 2018-01-14 VITALS — BP 123/88 | HR 74 | Ht 70.0 in | Wt 213.6 lb

## 2018-01-14 DIAGNOSIS — E669 Obesity, unspecified: Secondary | ICD-10-CM

## 2018-01-14 DIAGNOSIS — Z6830 Body mass index (BMI) 30.0-30.9, adult: Secondary | ICD-10-CM

## 2018-01-14 MED ORDER — PHENTERMINE HCL 37.5 MG PO TABS: 37.5000 mg | ORAL_TABLET | Freq: Every day | ORAL | 2 refills | Status: DC

## 2018-01-14 MED ORDER — CYANOCOBALAMIN 1000 MCG/ML IJ SOLN: 1000.0000 ug | INTRAMUSCULAR | 3 refills | Status: DC

## 2018-01-14 MED ORDER — ERGOCALCIFEROL 62.5 MCG (2500 UT) PO CAPS: 2500.0000 [IU] | ORAL_CAPSULE | Freq: Every day | ORAL | 4 refills | Status: DC

## 2018-01-14 NOTE — Progress Notes (Signed)
Subjective:  Kiara Le is a 42 y.o. G3P2 at Unknown being seen today for weight loss management- initial visit.  Patient reports General ROS: negative and reports previous weight loss attempts: Has gastric surgery and lost from 305 down to 175 #s, but weight is now steady at 200+.  Onset was gradual over  months/year(s) ago.  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  Previous/Current treatment includes: small frequent feedings, nutritional supplement, vitamin supplement.    The patient has a surgical history OI:NOMVEHMCN surgery (2014) and partial-hysterectomy x3. Tubal pregnancy removal.   Pertinent social history includes: alcohol abuse, tobacco abuse and marijuana use.   Past evaluation has included: metabolic profile, hemoglobin A1c, thyroid panel  and psychiatric evaluation.     The following portions of the patient's history were reviewed and updated as appropriate: allergies, current medications, past family history, past medical history, past social history, past surgical history and problem list.   Objective:   Vitals:   01/14/18 0827  BP: 123/88  Pulse: 74  Weight: 213 lb 9.6 oz (96.9 kg)  Height: 5\' 10"  (1.778 m)    General:  Alert, oriented and cooperative. Patient is in no acute distress.  :   :   :   :   :   :   PE: Well groomed female in no current distress,   Mental Status: Normal mood and affect. Normal behavior. Normal judgment and thought content.   Current BMI: Body mass index is 30.65 kg/m.   Assessment and Plan:  Obesity  There are no diagnoses linked to this encounter.  Plan: low carb, High protein diet RX for adipex 37.5 mg daily and B12 1081mcg.ml monthly, to start now with first injection given at today's visit. Reviewed side-effects common to both medications and expected outcomes. Increase daily water intake to at least 8 bottle a day, every day.  Goal is to reduce weight by 10% by end of three months, and will  re-evaluate then.  RTC in 4 weeks for Nurse visit to check weight & BP, and get next B12 injections.    Please refer to After Visit Summary for other counseling recommendations.    [place], Kiara Le, CNM   Phiona Ramnauth [place], CNM      Consider the Low Glycemic Index Diet and 6 smaller meals daily .  This boosts your metabolism and regulates your sugars:   Use the protein bar by [employer]kins because they have lots of fiber in them  Find the low carb flatbreads, tortillas and pita breads for sandwiches:  Joseph's makes a pita bread and a flat bread , available at [facility] and [employer]; [place] makes a low carb flatbread available at [employer] and HT that is 9 net carbs and 100 cal [place] makes a low carb whole wheat tortilla available at [employer] most grocery stores with 6 net carbs and 210 cal  [place] yogurt can still have a lot of carbs .  Dannon Light Le fit has 80 cal and 8 carbs

## 2018-01-14 NOTE — Patient Instructions (Signed)

## 2018-01-23 ENCOUNTER — Other Ambulatory Visit: Payer: Self-pay | Admitting: Family Medicine

## 2018-01-23 DIAGNOSIS — Z1231 Encounter for screening mammogram for malignant neoplasm of breast: Secondary | ICD-10-CM

## 2018-02-10 ENCOUNTER — Encounter: Payer: Self-pay | Admitting: Obstetrics and Gynecology

## 2018-02-10 ENCOUNTER — Ambulatory Visit (INDEPENDENT_AMBULATORY_CARE_PROVIDER_SITE_OTHER): Payer: BLUE CROSS/BLUE SHIELD | Admitting: Obstetrics and Gynecology

## 2018-02-10 ENCOUNTER — Other Ambulatory Visit: Payer: Self-pay | Admitting: Family Medicine

## 2018-02-10 VITALS — BP 125/80 | HR 78 | Ht 70.0 in | Wt 204.7 lb

## 2018-02-10 DIAGNOSIS — E669 Obesity, unspecified: Secondary | ICD-10-CM

## 2018-02-10 MED ORDER — CYANOCOBALAMIN 1000 MCG/ML IJ SOLN
1000.0000 ug | Freq: Once | INTRAMUSCULAR | Status: AC
  Administered 2018-02-10: 1000 ug via INTRAMUSCULAR

## 2018-02-10 NOTE — Progress Notes (Signed)
Pt is here for wt, bp check, b-12 inj She is doing well, denies any s/e  02/10/18 wt- 204.7lb 01/14/18 wt- 213lb

## 2018-02-17 ENCOUNTER — Other Ambulatory Visit: Payer: Self-pay | Admitting: Family Medicine

## 2018-02-17 DIAGNOSIS — E559 Vitamin D deficiency, unspecified: Secondary | ICD-10-CM

## 2018-02-23 ENCOUNTER — Encounter: Payer: Self-pay | Admitting: Family Medicine

## 2018-03-02 ENCOUNTER — Ambulatory Visit
Admission: RE | Admit: 2018-03-02 | Discharge: 2018-03-02 | Disposition: A | Discharge status: Home / Self Care | Payer: BLUE CROSS/BLUE SHIELD | Source: Ambulatory Visit | Attending: Family Medicine | Admitting: Family Medicine

## 2018-03-02 DIAGNOSIS — Z1231 Encounter for screening mammogram for malignant neoplasm of breast: Secondary | ICD-10-CM | POA: Insufficient documentation

## 2018-03-10 ENCOUNTER — Ambulatory Visit (INDEPENDENT_AMBULATORY_CARE_PROVIDER_SITE_OTHER): Payer: BLUE CROSS/BLUE SHIELD | Admitting: Obstetrics and Gynecology

## 2018-03-10 ENCOUNTER — Encounter: Payer: Self-pay | Admitting: Obstetrics and Gynecology

## 2018-03-10 VITALS — BP 121/78 | HR 90 | Ht 70.0 in | Wt 203.2 lb

## 2018-03-10 MED ORDER — CYANOCOBALAMIN 1000 MCG/ML IJ SOLN
1000.0000 ug | Freq: Once | INTRAMUSCULAR | Status: AC
  Administered 2018-03-10: 1000 ug via INTRAMUSCULAR

## 2018-03-10 NOTE — Progress Notes (Signed)
Pt is here for wt, bp check, b-12  iinj She is doing well, denies any s/e   03/10/18 wt- 203lb 02/10/18 wt- 204lb  Addendum: Discussed diet and exercises habits for the last month. Will discuss exercise routine with trainer at [facility] in [place]. Also will cut out night time cereal.  Melody Shambley,CNM

## 2018-03-11 ENCOUNTER — Ambulatory Visit: Payer: BLUE CROSS/BLUE SHIELD | Admitting: Family Medicine

## 2018-03-11 ENCOUNTER — Encounter: Payer: Self-pay | Admitting: Family Medicine

## 2018-03-11 VITALS — BP 122/68 | HR 97 | Temp 97.6°F | Resp 16 | Ht 70.0 in | Wt 201.6 lb

## 2018-03-11 DIAGNOSIS — Z23 Encounter for immunization: Secondary | ICD-10-CM | POA: Diagnosis not present

## 2018-03-11 DIAGNOSIS — F3132 Bipolar disorder, current episode depressed, moderate: Secondary | ICD-10-CM | POA: Diagnosis not present

## 2018-03-11 DIAGNOSIS — F4310 Post-traumatic stress disorder, unspecified: Secondary | ICD-10-CM

## 2018-03-11 DIAGNOSIS — E559 Vitamin D deficiency, unspecified: Secondary | ICD-10-CM

## 2018-03-11 DIAGNOSIS — K219 Gastro-esophageal reflux disease without esophagitis: Secondary | ICD-10-CM

## 2018-03-11 DIAGNOSIS — Z9884 Bariatric surgery status: Secondary | ICD-10-CM

## 2018-03-11 DIAGNOSIS — M25561 Pain in right knee: Secondary | ICD-10-CM | POA: Diagnosis not present

## 2018-03-11 DIAGNOSIS — M5441 Lumbago with sciatica, right side: Secondary | ICD-10-CM

## 2018-03-11 DIAGNOSIS — G8929 Other chronic pain: Secondary | ICD-10-CM

## 2018-03-11 DIAGNOSIS — M25551 Pain in right hip: Secondary | ICD-10-CM

## 2018-03-11 MED ORDER — PREGABALIN 50 MG PO CAPS: 50.0000 mg | ORAL_CAPSULE | ORAL | 0 refills | Status: DC

## 2018-03-11 MED ORDER — VITAMIN D 50 MCG (2000 UT) PO CAPS: 1.0000 | ORAL_CAPSULE | Freq: Every day | ORAL | 0 refills | Status: DC

## 2018-03-11 MED ORDER — CYCLOBENZAPRINE HCL 10 MG PO TABS: 10.0000 mg | ORAL_TABLET | Freq: Two times a day (BID) | ORAL | 1 refills | Status: DC | PRN

## 2018-03-11 MED ORDER — LIDOCAINE 5 % EX PTCH: 1.0000 | MEDICATED_PATCH | CUTANEOUS | 1 refills | Status: DC

## 2018-03-11 MED ORDER — PANTOPRAZOLE SODIUM 40 MG PO TBEC: 40.0000 mg | DELAYED_RELEASE_TABLET | Freq: Every day | ORAL | 1 refills | Status: DC

## 2018-03-11 NOTE — Patient Instructions (Signed)
Vraylar?
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
## 2018-03-27 ENCOUNTER — Telehealth: Payer: Self-pay | Admitting: Family Medicine

## 2018-03-27 DIAGNOSIS — G8929 Other chronic pain: Secondary | ICD-10-CM

## 2018-03-27 DIAGNOSIS — M25561 Pain in right knee: Principal | ICD-10-CM

## 2018-03-27 DIAGNOSIS — M25551 Pain in right hip: Secondary | ICD-10-CM

## 2018-03-27 DIAGNOSIS — M5441 Lumbago with sciatica, right side: Secondary | ICD-10-CM

## 2018-03-27 NOTE — Telephone Encounter (Signed)
Copied from [place] [id number]. Topic: Referral - Request for Referral >> Mar 27, 2018  8:09 AM Margot Ables wrote: Has patient seen PCP for this complaint? yes *If NO, is insurance requiring patient see PCP for this issue before PCP can refer them? - ins does require referral Referral for which specialty: Ortho Preferred provider/office: [facility] in [place] Reason for referral: pain in right hip and right knee  [facility] [place], [place], [place] [address] Main Phone: [phone] Fax: [phone]

## 2018-03-31 ENCOUNTER — Encounter: Payer: Self-pay | Admitting: Obstetrics and Gynecology

## 2018-04-01 DIAGNOSIS — M25561 Pain in right knee: Secondary | ICD-10-CM | POA: Diagnosis not present

## 2018-04-01 DIAGNOSIS — G8929 Other chronic pain: Secondary | ICD-10-CM | POA: Diagnosis not present

## 2018-04-01 DIAGNOSIS — M25862 Other specified joint disorders, left knee: Secondary | ICD-10-CM | POA: Diagnosis not present

## 2018-04-01 DIAGNOSIS — M25861 Other specified joint disorders, right knee: Secondary | ICD-10-CM | POA: Diagnosis not present

## 2018-04-01 DIAGNOSIS — M25551 Pain in right hip: Secondary | ICD-10-CM | POA: Diagnosis not present

## 2018-04-01 DIAGNOSIS — M5441 Lumbago with sciatica, right side: Secondary | ICD-10-CM | POA: Diagnosis not present

## 2018-04-07 ENCOUNTER — Encounter: Payer: Self-pay | Admitting: Obstetrics and Gynecology

## 2018-04-07 ENCOUNTER — Ambulatory Visit (INDEPENDENT_AMBULATORY_CARE_PROVIDER_SITE_OTHER): Payer: BLUE CROSS/BLUE SHIELD | Admitting: Obstetrics and Gynecology

## 2018-04-07 VITALS — BP 123/83 | HR 77 | Ht 70.0 in | Wt 198.9 lb

## 2018-04-07 MED ORDER — CYANOCOBALAMIN 1000 MCG/ML IJ SOLN
1000.0000 ug | Freq: Once | INTRAMUSCULAR | Status: AC
  Administered 2018-04-07: 1000 ug via INTRAMUSCULAR

## 2018-04-07 NOTE — Progress Notes (Signed)
Pt is here for wt, bp check, b-12 inj She is doing well, denies any s/e  04/07/18 wt- 198.9lb 03/10/18 wt- 203lb  Waist- 40 in

## 2018-04-28 ENCOUNTER — Other Ambulatory Visit: Payer: Self-pay | Admitting: Obstetrics and Gynecology

## 2018-05-06 ENCOUNTER — Encounter: Payer: Self-pay | Admitting: *Deleted

## 2018-05-08 ENCOUNTER — Encounter: Payer: Self-pay | Admitting: Obstetrics and Gynecology

## 2018-05-11 ENCOUNTER — Ambulatory Visit: Payer: BLUE CROSS/BLUE SHIELD | Admitting: Obstetrics and Gynecology

## 2018-05-11 ENCOUNTER — Encounter: Payer: Self-pay | Admitting: Obstetrics and Gynecology

## 2018-05-11 VITALS — BP 117/77 | HR 87 | Ht 70.0 in | Wt 196.3 lb

## 2018-05-11 DIAGNOSIS — E669 Obesity, unspecified: Secondary | ICD-10-CM | POA: Diagnosis not present

## 2018-05-11 MED ORDER — PHENTERMINE HCL 37.5 MG PO TABS: 37.5000 mg | ORAL_TABLET | Freq: Every day | ORAL | 2 refills | Status: DC

## 2018-05-11 MED ORDER — PHENTERMINE HCL 37.5 MG PO TABS: 37.5000 mg | ORAL_TABLET | Freq: Two times a day (BID) | ORAL | 2 refills | Status: DC

## 2018-05-11 NOTE — Progress Notes (Signed)
Pt is here for wt, bp check, b-12 inj She is doing well, has lost 2 inches, is out of phentermine, desires refill  Previous weight   Today 196 lbs 4.8oz 04/07/18 198 lb 14.4oz 03/10/18 203 lb 3.2oz Congratulated on continued weight loss. Encouraged increasing exercise and continued water intake will increase tablets to 1 in am (takes at 4:30am before gym), and 1/2 tablet at noon.  Will send weight and waist circ. Next month and return here in 8 weeks.          Melody Shambley,CNM

## 2018-05-13 ENCOUNTER — Other Ambulatory Visit: Payer: Self-pay | Admitting: Obstetrics and Gynecology

## 2018-06-10 ENCOUNTER — Encounter: Payer: Self-pay | Admitting: Obstetrics and Gynecology

## 2018-06-12 DIAGNOSIS — M25551 Pain in right hip: Secondary | ICD-10-CM | POA: Diagnosis not present

## 2018-06-12 DIAGNOSIS — G8929 Other chronic pain: Secondary | ICD-10-CM | POA: Diagnosis not present

## 2018-06-17 ENCOUNTER — Ambulatory Visit: Payer: Self-pay | Admitting: Family Medicine

## 2018-07-14 ENCOUNTER — Encounter: Payer: BLUE CROSS/BLUE SHIELD | Admitting: Obstetrics and Gynecology

## 2018-07-21 ENCOUNTER — Ambulatory Visit (INDEPENDENT_AMBULATORY_CARE_PROVIDER_SITE_OTHER): Payer: BLUE CROSS/BLUE SHIELD | Admitting: Obstetrics and Gynecology

## 2018-07-21 ENCOUNTER — Encounter: Payer: Self-pay | Admitting: Obstetrics and Gynecology

## 2018-07-21 VITALS — BP 118/70 | HR 83 | Ht 70.0 in | Wt 194.5 lb

## 2018-07-21 DIAGNOSIS — Z6827 Body mass index (BMI) 27.0-27.9, adult: Secondary | ICD-10-CM

## 2018-07-21 DIAGNOSIS — E669 Obesity, unspecified: Secondary | ICD-10-CM

## 2018-07-21 MED ORDER — CYANOCOBALAMIN 1000 MCG/ML IJ SOLN
1000.0000 ug | Freq: Once | INTRAMUSCULAR | Status: AC
  Administered 2018-07-21: 1000 ug via INTRAMUSCULAR

## 2018-07-21 NOTE — Progress Notes (Signed)
Pt is here for wt, bp check, b-12 inj She is doing well, denies any s/e States she is "stress eating"  07/21/18 wt- 194.5lb 05/11/18 wt- 196lb  Waist 37 in

## 2018-07-28 ENCOUNTER — Other Ambulatory Visit: Payer: Self-pay

## 2018-07-28 ENCOUNTER — Ambulatory Visit: Payer: BLUE CROSS/BLUE SHIELD | Admitting: Family Medicine

## 2018-07-28 ENCOUNTER — Encounter: Payer: Self-pay | Admitting: Family Medicine

## 2018-07-28 VITALS — BP 116/84 | HR 90 | Temp 97.8°F | Resp 16 | Ht 70.0 in | Wt 193.4 lb

## 2018-07-28 DIAGNOSIS — Z8639 Personal history of other endocrine, nutritional and metabolic disease: Secondary | ICD-10-CM | POA: Diagnosis not present

## 2018-07-28 DIAGNOSIS — K219 Gastro-esophageal reflux disease without esophagitis: Secondary | ICD-10-CM

## 2018-07-28 DIAGNOSIS — F3132 Bipolar disorder, current episode depressed, moderate: Secondary | ICD-10-CM | POA: Diagnosis not present

## 2018-07-28 DIAGNOSIS — M5441 Lumbago with sciatica, right side: Secondary | ICD-10-CM

## 2018-07-28 DIAGNOSIS — M25561 Pain in right knee: Secondary | ICD-10-CM

## 2018-07-28 DIAGNOSIS — Z9884 Bariatric surgery status: Secondary | ICD-10-CM | POA: Diagnosis not present

## 2018-07-28 DIAGNOSIS — G8929 Other chronic pain: Secondary | ICD-10-CM

## 2018-07-28 DIAGNOSIS — E559 Vitamin D deficiency, unspecified: Secondary | ICD-10-CM

## 2018-07-28 MED ORDER — CARIPRAZINE HCL 1.5 MG PO CAPS: 1.5000 mg | ORAL_CAPSULE | Freq: Every day | ORAL | 0 refills | Status: DC

## 2018-07-28 MED ORDER — PANTOPRAZOLE SODIUM 40 MG PO TBEC: 40.0000 mg | DELAYED_RELEASE_TABLET | Freq: Every day | ORAL | 1 refills | Status: DC

## 2018-07-28 MED ORDER — LIDOCAINE 5 % EX PTCH: 1.0000 | MEDICATED_PATCH | CUTANEOUS | 1 refills | Status: DC

## 2018-07-28 MED ORDER — METAXALONE 800 MG PO TABS: 800.0000 mg | ORAL_TABLET | Freq: Three times a day (TID) | ORAL | 0 refills | Status: DC
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
## 2018-07-30 ENCOUNTER — Telehealth: Payer: Self-pay | Admitting: Family Medicine

## 2018-07-30 NOTE — Telephone Encounter (Signed)
Copied from [place] [id number]. Topic: Quick Communication - See Telephone Encounter >> Jul 30, 2018  3:19 PM Blase Mess A wrote: CRM for notification. See Telephone encounter for: 07/30/18.  Patient is calling because the metaxalone (SKELAXIN) 800 MG tablet [[id number]] the patient is unable to afford. The patient was offered a discount card and it was $150. She is requesting the flexeril to be sent to the pharmacy. [id number] - [place], [place] - [address] [phone] (Phone) [phone] (Fax)

## 2018-07-30 NOTE — Telephone Encounter (Signed)
See pt. request for Flexeril, since Skelaxin is too expensive.

## 2018-07-31 ENCOUNTER — Other Ambulatory Visit: Payer: Self-pay

## 2018-07-31 MED ORDER — CYCLOBENZAPRINE HCL 10 MG PO TABS: 5.0000 mg | ORAL_TABLET | Freq: Three times a day (TID) | ORAL | 0 refills | Status: DC | PRN

## 2018-07-31 NOTE — Telephone Encounter (Signed)
Arman Filter is not covered under her insurance-per Dr. Ancil Boozer to tee'd up Abilify 2 mg.

## 2018-08-01 MED ORDER — ARIPIPRAZOLE 2 MG PO TABS: 2.0000 mg | ORAL_TABLET | Freq: Every day | ORAL | 0 refills | Status: DC

## 2018-08-26 ENCOUNTER — Other Ambulatory Visit: Payer: Self-pay | Admitting: Family Medicine

## 2018-08-31 ENCOUNTER — Encounter: Payer: Self-pay | Admitting: Family Medicine

## 2018-09-01 ENCOUNTER — Ambulatory Visit (INDEPENDENT_AMBULATORY_CARE_PROVIDER_SITE_OTHER): Payer: BLUE CROSS/BLUE SHIELD | Admitting: Family Medicine

## 2018-09-01 ENCOUNTER — Encounter: Payer: Self-pay | Admitting: Family Medicine

## 2018-09-01 ENCOUNTER — Other Ambulatory Visit: Payer: Self-pay | Admitting: Obstetrics and Gynecology

## 2018-09-01 VITALS — BP 113/78 | HR 84 | Temp 96.9°F | Ht 70.0 in | Wt 189.9 lb

## 2018-09-01 DIAGNOSIS — G8929 Other chronic pain: Secondary | ICD-10-CM

## 2018-09-01 DIAGNOSIS — F3132 Bipolar disorder, current episode depressed, moderate: Secondary | ICD-10-CM

## 2018-09-01 DIAGNOSIS — M25561 Pain in right knee: Secondary | ICD-10-CM | POA: Diagnosis not present

## 2018-09-01 DIAGNOSIS — M5441 Lumbago with sciatica, right side: Secondary | ICD-10-CM

## 2018-09-01 DIAGNOSIS — F410 Panic disorder [episodic paroxysmal anxiety] without agoraphobia: Secondary | ICD-10-CM

## 2018-09-01 MED ORDER — HYDROXYZINE HCL 10 MG PO TABS: 10.0000 mg | ORAL_TABLET | Freq: Three times a day (TID) | ORAL | 0 refills | Status: DC | PRN

## 2018-09-01 MED ORDER — LIDOCAINE 5 % EX PTCH: 2.0000 | MEDICATED_PATCH | CUTANEOUS | 2 refills | Status: DC
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
## 2018-09-03 ENCOUNTER — Other Ambulatory Visit: Payer: Self-pay | Admitting: Obstetrics and Gynecology

## 2018-09-03 MED ORDER — CYANOCOBALAMIN 1000 MCG/ML IJ SOLN: 1000.0000 ug | INTRAMUSCULAR | 3 refills | Status: DC

## 2018-09-03 MED ORDER — PHENTERMINE HCL 37.5 MG PO TABS: ORAL_TABLET | ORAL | 2 refills | Status: DC

## 2018-09-04 ENCOUNTER — Other Ambulatory Visit: Payer: Self-pay | Admitting: Family Medicine

## 2018-09-04 NOTE — Telephone Encounter (Signed)
Refill request for general medication. Flexeril to [facility]  Last office visit 09/01/2018   Follow up on 12/30/2018

## 2018-09-19 ENCOUNTER — Other Ambulatory Visit: Payer: Self-pay | Admitting: Family Medicine

## 2018-09-23 ENCOUNTER — Other Ambulatory Visit: Payer: Self-pay | Admitting: Family Medicine

## 2018-09-23 DIAGNOSIS — F410 Panic disorder [episodic paroxysmal anxiety] without agoraphobia: Secondary | ICD-10-CM

## 2018-09-24 ENCOUNTER — Other Ambulatory Visit: Payer: Self-pay

## 2018-09-24 ENCOUNTER — Encounter: Payer: Self-pay | Admitting: Obstetrics and Gynecology

## 2018-09-24 ENCOUNTER — Ambulatory Visit (INDEPENDENT_AMBULATORY_CARE_PROVIDER_SITE_OTHER): Payer: BLUE CROSS/BLUE SHIELD | Admitting: Obstetrics and Gynecology

## 2018-09-24 VITALS — BP 118/70 | HR 83 | Ht 70.0 in | Wt 180.4 lb

## 2018-09-24 DIAGNOSIS — Z79899 Other long term (current) drug therapy: Secondary | ICD-10-CM

## 2018-09-24 DIAGNOSIS — Z6825 Body mass index (BMI) 25.0-25.9, adult: Secondary | ICD-10-CM | POA: Diagnosis not present

## 2018-09-24 NOTE — Progress Notes (Signed)
Virtual Visit via Telephone Note  I connected with Kiara Le on 09/24/18 at  1:30 PM EDT by telephone and verified that I am speaking with the correct person using two identifiers.   I discussed the limitations, risks, security and privacy concerns of performing an evaluation and management service by telephone and the availability of in person appointments. I also discussed with the patient that there may be a patient responsible charge related to this service. The patient expressed understanding and agreed to proceed.   History of Present Illness: Was seen previously for weight loss management. Sister is giving the B12 , she is still exercising. Is OOW at this time. And states things are pretty stressful. Not sleeping well, although Dr Ancil Boozer started her on atarax last month. She doesn't feel like it is helping. States she is feeling well.    Observations/Objective: Well sounding female in no distress Patient reported weight 180 lbs  Assessment and Plan: Weight loss management/ BMI 25.88 Medication management   Follow Up Instructions: Will continue with meds at this time. Goal BMI is 23. Encouraged to follow up with Dr Ancil Boozer on sleep medication. RTC in 2 months for medication management and weight check.   I discussed the assessment and treatment plan with the patient. The patient was provided an opportunity to ask questions and all were answered. The patient agreed with the plan and demonstrated an understanding of the instructions.   The patient was advised to call back or seek an in-person evaluation if the symptoms worsen or if the condition fails to improve as anticipated.  I provided 10 minutes of non-face-to-face time during this encounter.   Melody Rockney Ghee, CNM

## 2018-10-06 ENCOUNTER — Other Ambulatory Visit: Payer: Self-pay | Admitting: Family Medicine

## 2018-10-06 NOTE — Telephone Encounter (Signed)
Refill request for general medication. Flexeril   Last office visit 09/01/2018   Follow up on 12/30/2018

## 2018-10-13 ENCOUNTER — Other Ambulatory Visit: Payer: Self-pay | Admitting: Family Medicine

## 2018-10-13 NOTE — Telephone Encounter (Signed)
Refill request for general medication: Abilify 2 mg  Last office visit: 09/01/2018  Follow-ups on file. 12/30/2018

## 2018-10-16 NOTE — Telephone Encounter (Signed)
Patient called back and feels the dose she is taking is working well. She has not had many panic attacks and wants to stay on the dose she is on.

## 2018-10-16 NOTE — Telephone Encounter (Signed)
Called patient no answer. Lvm to see how she is doing. Are you going to send 90 day refill.

## 2018-10-24 IMAGING — MG MM DIGITAL SCREENING BILAT W/ TOMO W/ CAD
8 series · 8 of 24 positions shown · non-contrast
Comparison: Previous exam(s).

CLINICAL DATA: Screening.

EXAM:
DIGITAL SCREENING BILATERAL MAMMOGRAM WITH TOMO AND CAD

[R CC synth-2D]
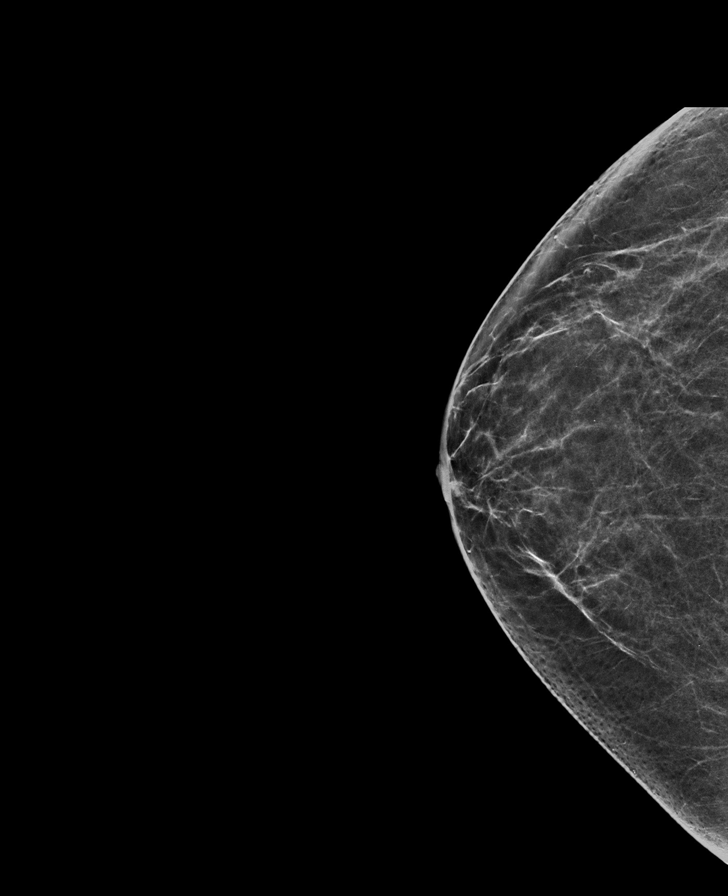

[L MLO synth-2D]
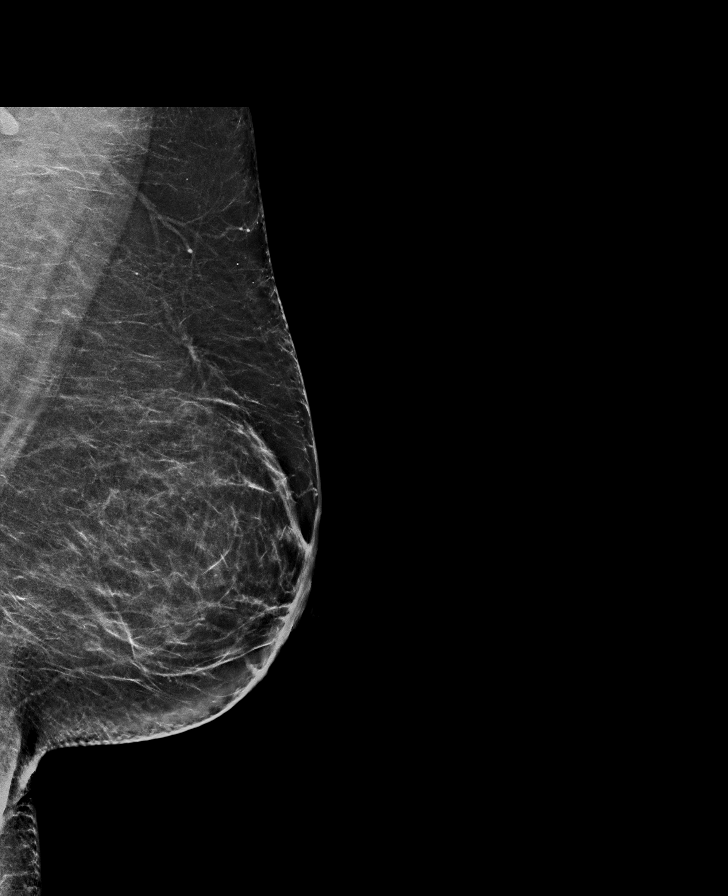

[L CC synth-2D]
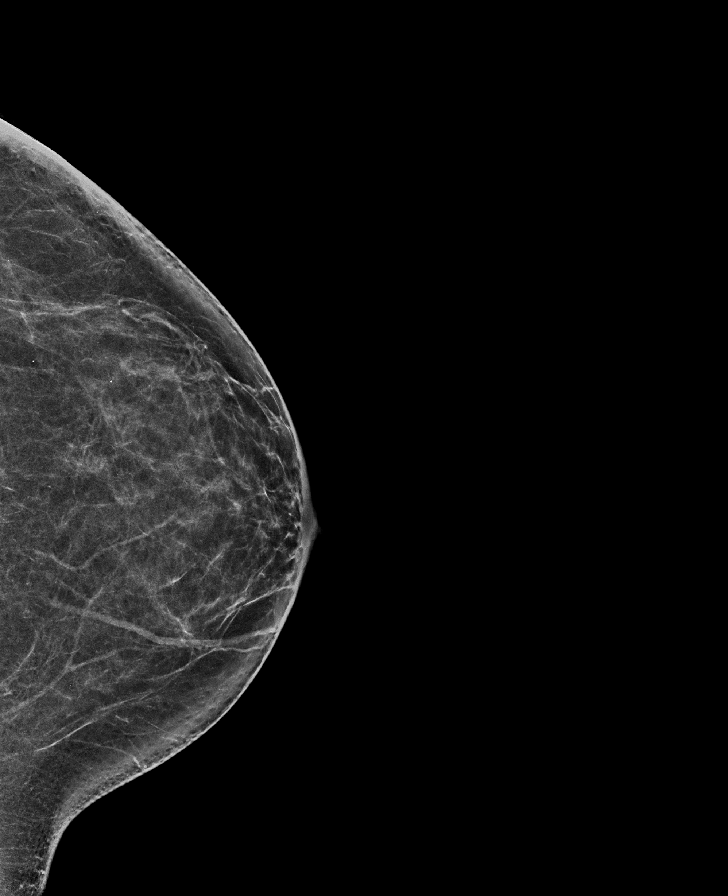

[R MLO synth-2D]
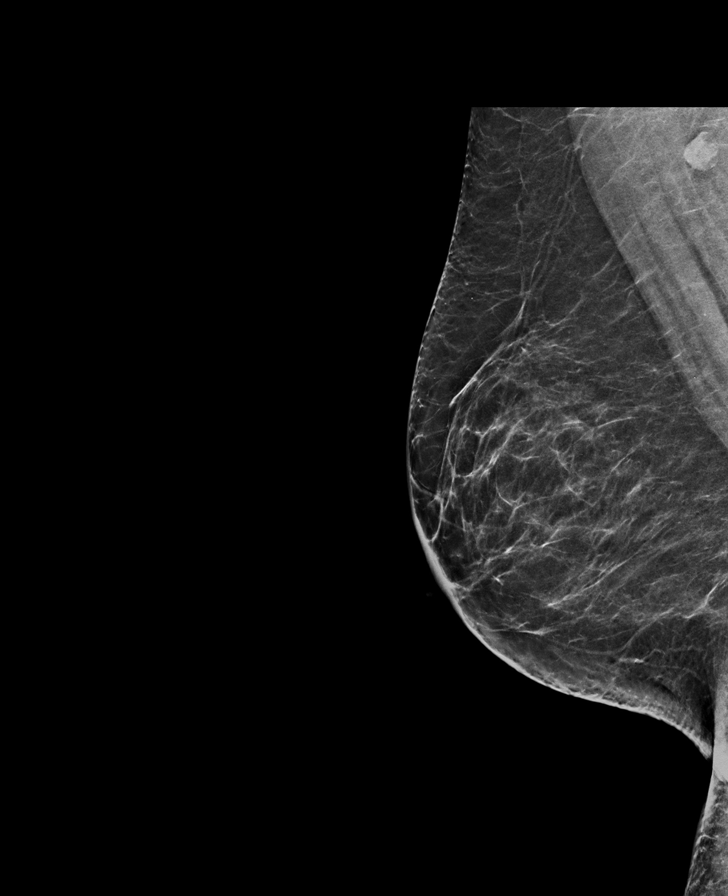

[L CC tomo · tomo slice 34/67.0]
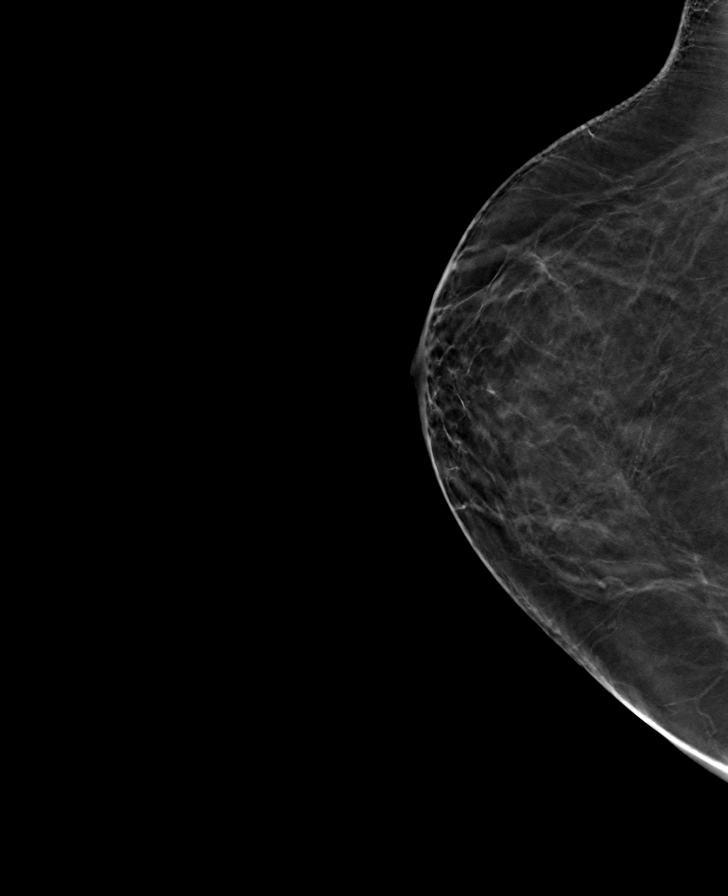

[R CC tomo · tomo slice 33/65.0]
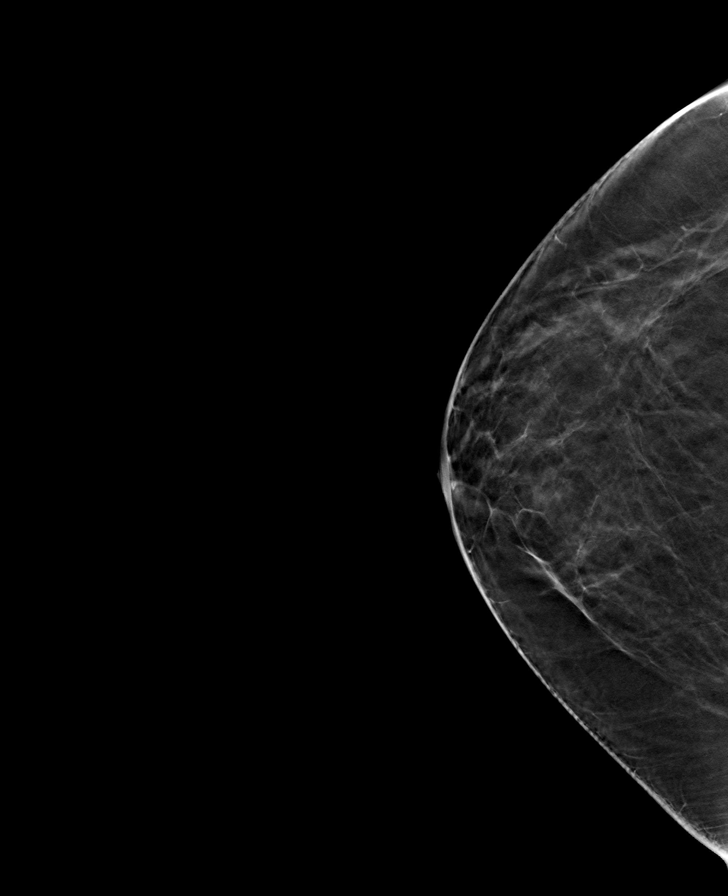

[L MLO tomo · tomo slice 38/75.0]
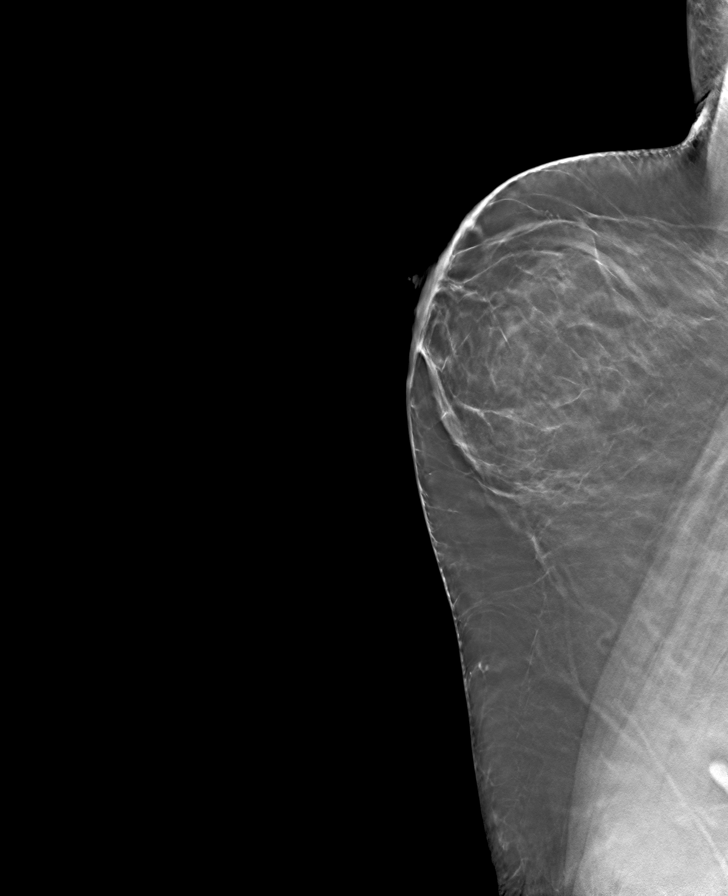

[R MLO tomo · tomo slice 35/70.0]
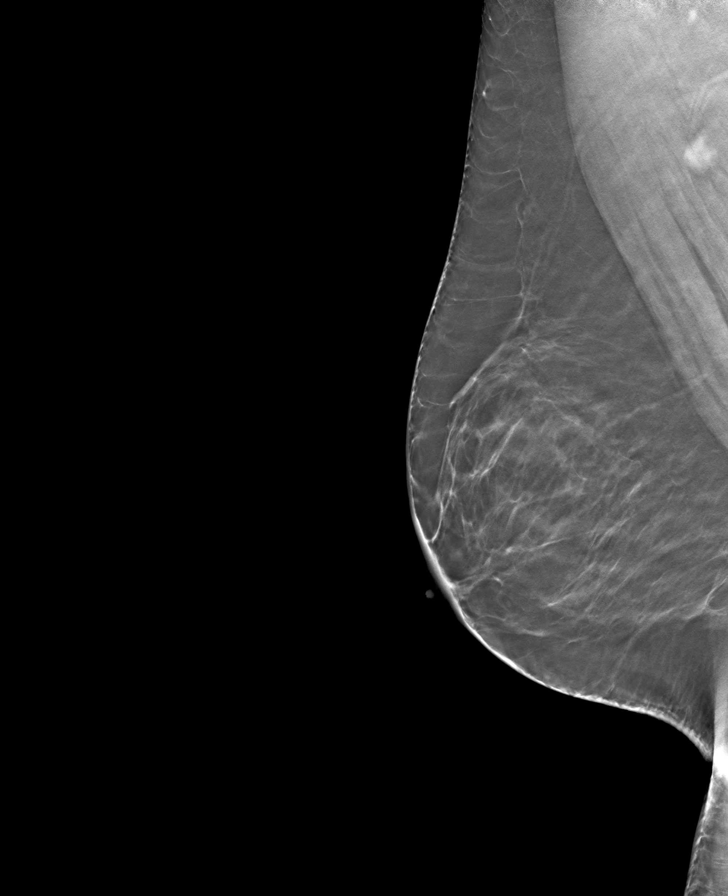

[8 of 24 positions shown; findings below may reference images not displayed]

ACR Breast Density Category b: There are scattered areas of
fibroglandular density.
FINDINGS: There are no findings suspicious for malignancy. Images were
processed with CAD.
IMPRESSION: No mammographic evidence of malignancy. A result letter of this
screening mammogram will be mailed directly to the patient.

RECOMMENDATION:
Screening mammogram in one year. (Code:[id number])

BI-RADS CATEGORY  1: Negative.

## 2018-11-24 ENCOUNTER — Encounter: Payer: Self-pay | Admitting: Obstetrics and Gynecology

## 2018-11-30 ENCOUNTER — Other Ambulatory Visit: Payer: Self-pay | Admitting: Family Medicine

## 2018-11-30 DIAGNOSIS — M25561 Pain in right knee: Secondary | ICD-10-CM

## 2018-11-30 DIAGNOSIS — G8929 Other chronic pain: Secondary | ICD-10-CM

## 2018-11-30 NOTE — Telephone Encounter (Signed)
Refill request for general medication. Flexeril and Lidocaine   Last office visit 09/01/2018   Follow up on 12/09/2018

## 2018-12-09 ENCOUNTER — Ambulatory Visit (INDEPENDENT_AMBULATORY_CARE_PROVIDER_SITE_OTHER): Payer: BC Managed Care – PPO | Admitting: Family Medicine

## 2018-12-09 ENCOUNTER — Other Ambulatory Visit: Payer: Self-pay

## 2018-12-09 ENCOUNTER — Encounter: Payer: Self-pay | Admitting: Family Medicine

## 2018-12-09 VITALS — BP 121/80 | HR 90 | Temp 98.2°F | Ht 70.0 in | Wt 187.8 lb

## 2018-12-09 DIAGNOSIS — F3132 Bipolar disorder, current episode depressed, moderate: Secondary | ICD-10-CM | POA: Diagnosis not present

## 2018-12-09 DIAGNOSIS — E559 Vitamin D deficiency, unspecified: Secondary | ICD-10-CM

## 2018-12-09 DIAGNOSIS — Z9884 Bariatric surgery status: Secondary | ICD-10-CM

## 2018-12-09 DIAGNOSIS — Z8639 Personal history of other endocrine, nutritional and metabolic disease: Secondary | ICD-10-CM | POA: Diagnosis not present

## 2018-12-09 DIAGNOSIS — D75839 Thrombocytosis, unspecified: Secondary | ICD-10-CM

## 2018-12-09 DIAGNOSIS — D473 Essential (hemorrhagic) thrombocythemia: Secondary | ICD-10-CM | POA: Diagnosis not present

## 2018-12-09 DIAGNOSIS — E786 Lipoprotein deficiency: Secondary | ICD-10-CM

## 2018-12-09 DIAGNOSIS — G8929 Other chronic pain: Secondary | ICD-10-CM

## 2018-12-09 DIAGNOSIS — E538 Deficiency of other specified B group vitamins: Secondary | ICD-10-CM

## 2018-12-09 DIAGNOSIS — M5441 Lumbago with sciatica, right side: Secondary | ICD-10-CM

## 2018-12-09 DIAGNOSIS — R252 Cramp and spasm: Secondary | ICD-10-CM

## 2018-12-09 MED ORDER — PREGABALIN 75 MG PO CAPS: 75.0000 mg | ORAL_CAPSULE | Freq: Two times a day (BID) | ORAL | 0 refills | Status: DC
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
## 2018-12-14 DIAGNOSIS — E559 Vitamin D deficiency, unspecified: Secondary | ICD-10-CM | POA: Diagnosis not present

## 2018-12-14 DIAGNOSIS — E786 Lipoprotein deficiency: Secondary | ICD-10-CM | POA: Diagnosis not present

## 2018-12-14 DIAGNOSIS — Z8639 Personal history of other endocrine, nutritional and metabolic disease: Secondary | ICD-10-CM | POA: Diagnosis not present

## 2018-12-14 DIAGNOSIS — D473 Essential (hemorrhagic) thrombocythemia: Secondary | ICD-10-CM | POA: Diagnosis not present

## 2018-12-15 LAB — CBC WITH DIFFERENTIAL/PLATELET
Absolute Monocytes: 278 cells/uL (ref 200–950)
Basophils Absolute: 38 cells/uL (ref 0–200)
Basophils Relative: 0.8 %
Eosinophils Absolute: 58 cells/uL (ref 15–500)
Eosinophils Relative: 1.2 %
HCT: 35.3 % (ref 35.0–45.0)
Hemoglobin: 11.5 g/dL — ABNORMAL LOW (ref 11.7–15.5)
Lymphs Abs: 1738 cells/uL (ref 850–3900)
MCH: 28.7 pg (ref 27.0–33.0)
MCHC: 32.6 g/dL (ref 32.0–36.0)
MCV: 88 fL (ref 80.0–100.0)
MPV: 10.1 fL (ref 7.5–12.5)
Monocytes Relative: 5.8 %
Neutro Abs: 2688 cells/uL (ref 1500–7800)
Neutrophils Relative %: 56 %
Platelets: 267 10*3/uL (ref 140–400)
RBC: 4.01 10*6/uL (ref 3.80–5.10)
RDW: 13.3 % (ref 11.0–15.0)
Total Lymphocyte: 36.2 %
WBC: 4.8 10*3/uL (ref 3.8–10.8)

## 2018-12-15 LAB — MAGNESIUM: Magnesium: 1.9 mg/dL (ref 1.5–2.5)

## 2018-12-15 LAB — COMPLETE METABOLIC PANEL WITH GFR
AG Ratio: 1.6 (calc) (ref 1.0–2.5)
ALT: 21 U/L (ref 6–29)
AST: 18 U/L (ref 10–30)
Albumin: 4.1 g/dL (ref 3.6–5.1)
Alkaline phosphatase (APISO): 79 U/L (ref 31–125)
BUN: 10 mg/dL (ref 7–25)
CO2: 28 mmol/L (ref 20–32)
Calcium: 8.7 mg/dL (ref 8.6–10.2)
Chloride: 105 mmol/L (ref 98–110)
Creat: 0.75 mg/dL (ref 0.50–1.10)
GFR, Est African American: 116 mL/min/{1.73_m2} (ref >=60)
GFR, Est Non African American: 100 mL/min/{1.73_m2} (ref >=60)
Globulin: 2.6 g/dL (calc) (ref 1.9–3.7)
Glucose, Bld: 85 mg/dL (ref 65–99)
Potassium: 4.1 mmol/L (ref 3.5–5.3)
Sodium: 137 mmol/L (ref 135–146)
Total Bilirubin: 0.4 mg/dL (ref 0.2–1.2)
Total Protein: 6.7 g/dL (ref 6.1–8.1)

## 2018-12-15 LAB — LIPID PANEL
Cholesterol: 122 mg/dL (ref <200)
HDL: 52 mg/dL (ref >=50)
LDL Cholesterol (Calc): 54 mg/dL (calc)
Non-HDL Cholesterol (Calc): 70 mg/dL (calc) (ref <130)
Total CHOL/HDL Ratio: 2.3 (calc) (ref <5.0)
Triglycerides: 82 mg/dL (ref <150)

## 2018-12-15 LAB — HEMOGLOBIN A1C
Hgb A1c MFr Bld: 4.7 % of total Hgb (ref <5.7)
Mean Plasma Glucose: 88 (calc)
eAG (mmol/L): 4.9 (calc)

## 2018-12-15 LAB — TSH: TSH: 2.91 mIU/L

## 2018-12-15 LAB — VITAMIN B12: Vitamin B-12: 1078 pg/mL (ref 200–1100)

## 2018-12-15 LAB — VITAMIN D 25 HYDROXY (VIT D DEFICIENCY, FRACTURES): Vit D, 25-Hydroxy: 31 ng/mL (ref 30–100)

## 2018-12-15 LAB — FERRITIN: Ferritin: 4 ng/mL — ABNORMAL LOW (ref 16–154)

## 2018-12-17 ENCOUNTER — Encounter: Payer: Self-pay | Admitting: Family Medicine

## 2018-12-17 ENCOUNTER — Other Ambulatory Visit: Payer: Self-pay | Admitting: Family Medicine

## 2018-12-17 DIAGNOSIS — F410 Panic disorder [episodic paroxysmal anxiety] without agoraphobia: Secondary | ICD-10-CM

## 2018-12-20 ENCOUNTER — Other Ambulatory Visit: Payer: Self-pay | Admitting: Family Medicine

## 2018-12-20 DIAGNOSIS — D508 Other iron deficiency anemias: Secondary | ICD-10-CM

## 2018-12-20 DIAGNOSIS — Z9884 Bariatric surgery status: Secondary | ICD-10-CM

## 2018-12-28 ENCOUNTER — Inpatient Hospital Stay: Payer: BC Managed Care – PPO | Attending: Internal Medicine | Admitting: Internal Medicine

## 2018-12-28 ENCOUNTER — Other Ambulatory Visit: Payer: Self-pay

## 2018-12-28 ENCOUNTER — Encounter: Payer: Self-pay | Admitting: Internal Medicine

## 2018-12-28 ENCOUNTER — Other Ambulatory Visit: Payer: Self-pay | Admitting: Family Medicine

## 2018-12-28 DIAGNOSIS — M545 Low back pain: Secondary | ICD-10-CM | POA: Diagnosis not present

## 2018-12-28 DIAGNOSIS — R5383 Other fatigue: Secondary | ICD-10-CM | POA: Diagnosis not present

## 2018-12-28 DIAGNOSIS — E559 Vitamin D deficiency, unspecified: Secondary | ICD-10-CM | POA: Diagnosis not present

## 2018-12-28 DIAGNOSIS — E785 Hyperlipidemia, unspecified: Secondary | ICD-10-CM

## 2018-12-28 DIAGNOSIS — J45909 Unspecified asthma, uncomplicated: Secondary | ICD-10-CM

## 2018-12-28 DIAGNOSIS — E669 Obesity, unspecified: Secondary | ICD-10-CM

## 2018-12-28 DIAGNOSIS — D508 Other iron deficiency anemias: Secondary | ICD-10-CM | POA: Diagnosis not present

## 2018-12-28 DIAGNOSIS — Z79899 Other long term (current) drug therapy: Secondary | ICD-10-CM

## 2018-12-28 DIAGNOSIS — F4310 Post-traumatic stress disorder, unspecified: Secondary | ICD-10-CM | POA: Diagnosis not present

## 2018-12-28 DIAGNOSIS — I10 Essential (primary) hypertension: Secondary | ICD-10-CM | POA: Diagnosis not present

## 2018-12-28 DIAGNOSIS — Z9884 Bariatric surgery status: Secondary | ICD-10-CM | POA: Diagnosis not present

## 2018-12-28 DIAGNOSIS — K5900 Constipation, unspecified: Secondary | ICD-10-CM

## 2018-12-28 DIAGNOSIS — G4700 Insomnia, unspecified: Secondary | ICD-10-CM

## 2018-12-28 DIAGNOSIS — R634 Abnormal weight loss: Secondary | ICD-10-CM | POA: Diagnosis not present

## 2018-12-28 DIAGNOSIS — E119 Type 2 diabetes mellitus without complications: Secondary | ICD-10-CM | POA: Diagnosis not present

## 2018-12-28 DIAGNOSIS — E538 Deficiency of other specified B group vitamins: Secondary | ICD-10-CM

## 2018-12-28 NOTE — Progress Notes (Signed)
[place] NOTE  Patient Care Team: Steele Sizer, MD as PCP - General (Family Medicine)  CHIEF COMPLAINTS/PURPOSE OF CONSULTATION:    HEMATOLOGY HISTORY  #Iron deficiency/gastric bypass-prior history of iron IV infusions [[facility]]; intolerance to p.o. iron.  # B12 deficiency- on B12 injection/ monthly [with Ob]  # DUODENAL SWITCH [~ 125 pounds weight loss/ Oct 2014; [facility] ]   HISTORY OF PRESENTING ILLNESS:   Kiara Le 43 y.o.  female has been referred to [place] for further evaluation/work-up for anemia.  Patient admits to ongoing fatigue.  Denies any blood in stools or black or stools. No blood in urine.  Denies any vaginal bleeding.  Poor tolerance on p.o. iron causing constipation.  No family history of any GI malignancies.   Review of Systems  Constitutional: Positive for malaise/fatigue. Negative for chills, diaphoresis, fever and weight loss.  HENT: Negative for nosebleeds and sore throat.   Eyes: Negative for double vision.  Respiratory: Negative for cough, hemoptysis, sputum production, shortness of breath and wheezing.   Cardiovascular: Negative for chest pain, palpitations, orthopnea and leg swelling.  Gastrointestinal: Negative for abdominal pain, blood in stool, constipation, diarrhea, heartburn, melena, nausea and vomiting.  Genitourinary: Negative for dysuria, frequency and urgency.  Musculoskeletal: Negative for back pain and joint pain.  Skin: Negative.  Negative for itching and rash.  Neurological: Negative for dizziness, tingling, focal weakness, weakness and headaches.  Endo/Heme/Allergies: Does not bruise/bleed easily.  Psychiatric/Behavioral: Negative for depression. The patient is not nervous/anxious and does not have insomnia.     MEDICAL HISTORY:  Past Medical History:  Diagnosis Date  . Anxiety   . Asthma   . Bipolar disorder ([place])   . Chronic fatigue   . Chronic low back pain   . Diabetes mellitus without complication  ([place])   . Fatty liver disease, nonalcoholic   . History of Helicobacter pylori infection   . Hyperlipidemia   . Hypertension   . Insomnia   . Morbid obesity ([place])   . Panic disorder   . PTSD (post-traumatic stress disorder)   . Vitamin B12 deficiency   . Vitamin D deficiency     SURGICAL HISTORY: Past Surgical History:  Procedure Laterality Date  . ABDOMINAL HYSTERECTOMY    . GASTROPLASTY DUODENAL SWITCH    . PARTIAL HYSTERECTOMY    . SALPINGECTOMY    . TUBAL LIGATION      SOCIAL HISTORY: Social History   Socioeconomic History  . Marital status: Married    Spouse name: Michiel Cowboy  . Number of children: 2  . Years of education: Not on file  . Highest education level: Not on file  Occupational History    Comment: lost job because of COVID-19   Social Needs  . Financial resource strain: Somewhat hard  . Food insecurity    Worry: Never true    Inability: Never true  . Transportation needs    Medical: No    Non-medical: No  Tobacco Use  . Smoking status: Never Smoker  . Smokeless tobacco: Never Used  Substance and Sexual Activity  . Alcohol use: No    Alcohol/week: 0.0 standard drinks  . Drug use: No  . Sexual activity: Yes    Partners: Male    Birth control/protection: None  Lifestyle  . Physical activity    Days per week: 4 days    Minutes per session: 60 min  . Stress: To some extent  Relationships  . Social Herbalist on  phone: Not on file    Gets together: Not on file    Attends religious service: Not on file    Active member of club or organization: Not on file    Attends meetings of clubs or organizations: Not on file    Relationship status: Not on file  . Intimate partner violence    Fear of current or ex partner: No    Emotionally abused: No    Physically abused: No    Forced sexual activity: No  Other Topics Concern  . Not on file  Social History Narrative   Married; Lost job because of COVID-19; non-smoker; ocassional alcohol.  [place]    FAMILY HISTORY: Family History  Problem Relation Age of Onset  . Heart disease Father   . Hypertension Father   . Hyperlipidemia Father   . Drug abuse Mother   . Anxiety disorder Sister   . Depression Sister   . Breast cancer Neg Hx     ALLERGIES:  is allergic to 2,4-d dimethylamine (amisol); bee venom; gabapentin; nsaids; other; tetanus toxoid adsorbed; and tetanus toxoids.  MEDICATIONS:  Current Outpatient Medications  Medication Sig Dispense Refill  . Cholecalciferol (VITAMIN D) 2000 units CAPS Take 1 capsule (2,000 Units total) by mouth daily. 30 capsule 0  . cyanocobalamin (,VITAMIN B-12,) 1000 MCG/ML injection Inject 1 mL (1,000 mcg total) into the muscle every 30 (thirty) days. 10 mL 3  . cyclobenzaprine (FLEXERIL) 10 MG tablet TAKE 0.5-1 TABLETS (5-10 MG TOTAL) BY MOUTH 3 (THREE) TIMES DAILY AS NEEDED FOR MUSCLE SPASMS. 90 tablet 0  . DENTAGEL 1.1 % GEL dental gel BRUSH ON TEETH AT BEDTIME DO NOT RINSE    . hydrOXYzine (ATARAX/VISTARIL) 10 MG tablet TAKE 1 TABLET BY MOUTH THREE TIMES A DAY AS NEEDED 90 tablet 0  . lidocaine (LIDODERM) 5 % PLACE 2 PATCHES ONTO THE SKIN DAILY. REMOVE & DISCARD PATCH WITHIN 12 HOURS OR AS DIRECTED BY MD 60 patch 0  . Multiple Vitamin (MULTI-VITAMINS) TABS Take by mouth.    . pantoprazole (PROTONIX) 40 MG tablet Take 1 tablet (40 mg total) by mouth daily. 90 tablet 1  . phentermine (ADIPEX-P) 37.5 MG tablet TAKE 1 TAB BY MOUTH TWICE A DAY 60 tablet 2  . pregabalin (LYRICA) 75 MG capsule Take 1 capsule (75 mg total) by mouth 2 (two) times daily. Start at night and go up to twice daily after a few days 60 capsule 0   No current facility-administered medications for this visit.       PHYSICAL EXAMINATION:   Vitals:   12/28/18 1150  BP: 119/82  Pulse: 87  Resp: 18  Temp: (!) 97.4 F (36.3 C)   Filed Weights   12/28/18 1150  Weight: 186 lb 6.4 oz (84.6 kg)    Physical Exam  Constitutional: She is oriented to person,  place, and time and well-developed, well-nourished, and in no distress.  HENT:  Head: Normocephalic and atraumatic.  Mouth/Throat: Oropharynx is clear and moist. No oropharyngeal exudate.  Eyes: Pupils are equal, round, and reactive to light.  Neck: Normal range of motion. Neck supple.  Cardiovascular: Normal rate and regular rhythm.  Pulmonary/Chest: No respiratory distress. She has no wheezes.  Abdominal: Soft. Bowel sounds are normal. She exhibits no distension and no mass. There is no abdominal tenderness. There is no rebound and no guarding.  Musculoskeletal: Normal range of motion.        General: No tenderness or edema.  Neurological: She is alert and  oriented to person, place, and time.  Skin: Skin is warm.  Psychiatric: Affect normal.    LABORATORY DATA:  I have reviewed the data as listed Lab Results  Component Value Date   WBC 4.8 12/14/2018   HGB 11.5 (L) 12/14/2018   HCT 35.3 12/14/2018   MCV 88.0 12/14/2018   PLT 267 12/14/2018   Recent Labs    12/14/18 1038  NA 137  K 4.1  CL 105  CO2 28  GLUCOSE 85  BUN 10  CREATININE 0.75  CALCIUM 8.7  GFRNONAA 100  GFRAA 116  PROT 6.7  AST 18  ALT 21  BILITOT 0.4     No results found.  Acquired iron deficiency anemia due to decreased absorption #Iron deficiency anemia gastric bypass-hemoglobin 11.5; however ferritin-4.  Recommend IV iron infusion Feraheme x2 to improve iron stores.  Patient tested.  Discussed the potential acute infusion reactions with IV iron; which are quite rare.  Patient understands the risk; will proceed with infusions.  # B12 def-continue B12 injections for with her provider.  Thank you Dr.Sowles for allowing me to participate in the care of your pleasant patient. Please do not hesitate to contact me with questions or concerns in the interim.  # DISPOSITION:  # IV ferrahem infusion q 2 weeks x2; start next week. # follow up in 3 months; MD- cbc/LDH; possible [place] [facility]- Dr.B   All  questions were answered. The patient knows to call the clinic with any problems, questions or concerns.    Cammie Sickle, MD 12/28/2018 2:28 PM

## 2018-12-28 NOTE — Progress Notes (Signed)
Referred for anemia.  Has tried oral iron 1 year ago and cause constipation.  Reports feeling of fatigue.

## 2018-12-28 NOTE — Assessment & Plan Note (Addendum)
#  Iron deficiency anemia gastric bypass-hemoglobin 11.5; however ferritin-4.  Recommend IV iron infusion Feraheme x2 to improve iron stores.  Patient tested.  Discussed the potential acute infusion reactions with IV iron; which are quite rare.  Patient understands the risk; will proceed with infusions.  # B12 def-continue B12 injections for with her provider.  Thank you Dr.Sowles for allowing me to participate in the care of your pleasant patient. Please do not hesitate to contact me with questions or concerns in the interim.  # DISPOSITION:  # IV ferrahem infusion q 2 weeks x2; start next week. # follow up in 3 months; MD- cbc/LDH; possible Ferrahem- Dr.B

## 2018-12-30 ENCOUNTER — Ambulatory Visit: Payer: Self-pay | Admitting: Family Medicine

## 2019-01-01 ENCOUNTER — Other Ambulatory Visit: Payer: Self-pay

## 2019-01-04 ENCOUNTER — Inpatient Hospital Stay: Payer: BC Managed Care – PPO | Attending: Internal Medicine

## 2019-01-04 ENCOUNTER — Other Ambulatory Visit: Payer: Self-pay

## 2019-01-04 VITALS — BP 118/79 | HR 72 | Temp 97.9°F | Resp 18

## 2019-01-04 DIAGNOSIS — D508 Other iron deficiency anemias: Secondary | ICD-10-CM

## 2019-01-04 DIAGNOSIS — Z79899 Other long term (current) drug therapy: Secondary | ICD-10-CM | POA: Diagnosis not present

## 2019-01-04 MED ORDER — SODIUM CHLORIDE 0.9 % IV SOLN
510.0000 mg | Freq: Once | INTRAVENOUS | Status: AC
  Administered 2019-01-04: 510 mg via INTRAVENOUS
  Filled 2019-01-04: qty 17

## 2019-01-04 MED ORDER — SODIUM CHLORIDE 0.9 % IV SOLN
Freq: Once | INTRAVENOUS | Status: AC
  Administered 2019-01-04: 12:00:00 via INTRAVENOUS
  Filled 2019-01-04: qty 250

## 2019-01-07 DIAGNOSIS — S39012A Strain of muscle, fascia and tendon of lower back, initial encounter: Secondary | ICD-10-CM | POA: Diagnosis not present

## 2019-01-11 ENCOUNTER — Other Ambulatory Visit: Payer: Self-pay | Admitting: Family Medicine

## 2019-01-11 ENCOUNTER — Inpatient Hospital Stay: Payer: BC Managed Care – PPO

## 2019-01-11 ENCOUNTER — Other Ambulatory Visit: Payer: Self-pay

## 2019-01-11 VITALS — BP 112/72 | HR 76 | Resp 18

## 2019-01-11 DIAGNOSIS — D508 Other iron deficiency anemias: Secondary | ICD-10-CM

## 2019-01-11 DIAGNOSIS — F3132 Bipolar disorder, current episode depressed, moderate: Secondary | ICD-10-CM

## 2019-01-11 DIAGNOSIS — R252 Cramp and spasm: Secondary | ICD-10-CM

## 2019-01-11 DIAGNOSIS — Z79899 Other long term (current) drug therapy: Secondary | ICD-10-CM | POA: Diagnosis not present

## 2019-01-11 DIAGNOSIS — G8929 Other chronic pain: Secondary | ICD-10-CM

## 2019-01-11 MED ORDER — SODIUM CHLORIDE 0.9 % IV SOLN
510.0000 mg | Freq: Once | INTRAVENOUS | Status: AC
  Administered 2019-01-11: 510 mg via INTRAVENOUS
  Filled 2019-01-11: qty 17

## 2019-01-11 MED ORDER — SODIUM CHLORIDE 0.9 % IV SOLN
Freq: Once | INTRAVENOUS | Status: AC
  Administered 2019-01-11: 12:00:00 via INTRAVENOUS
  Filled 2019-01-11: qty 250

## 2019-01-12 ENCOUNTER — Other Ambulatory Visit: Payer: Self-pay | Admitting: Family Medicine

## 2019-01-13 ENCOUNTER — Other Ambulatory Visit: Payer: Self-pay | Admitting: Family Medicine

## 2019-01-13 DIAGNOSIS — F410 Panic disorder [episodic paroxysmal anxiety] without agoraphobia: Secondary | ICD-10-CM

## 2019-01-18 ENCOUNTER — Telehealth: Payer: Self-pay | Admitting: *Deleted

## 2019-01-18 ENCOUNTER — Encounter: Payer: Self-pay | Admitting: Family Medicine

## 2019-01-18 ENCOUNTER — Other Ambulatory Visit: Payer: Self-pay

## 2019-01-18 ENCOUNTER — Ambulatory Visit (INDEPENDENT_AMBULATORY_CARE_PROVIDER_SITE_OTHER): Payer: BC Managed Care – PPO | Admitting: Family Medicine

## 2019-01-18 DIAGNOSIS — R252 Cramp and spasm: Secondary | ICD-10-CM | POA: Diagnosis not present

## 2019-01-18 DIAGNOSIS — F3132 Bipolar disorder, current episode depressed, moderate: Secondary | ICD-10-CM

## 2019-01-18 MED ORDER — PREGABALIN 75 MG PO CAPS: 75.0000 mg | ORAL_CAPSULE | Freq: Two times a day (BID) | ORAL | 1 refills | Status: DC

## 2019-01-18 NOTE — Telephone Encounter (Signed)
Patient called thinking she had an appointment today for iron and that she is not feeling well and would not be coming in for it. I called her back to let her know that she does not have an appointment with [place], but with Dr Ancil Boozer at 330 today and that she was only scheduled for 2 iron infusions and she had them. She thanked me for letting her know
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
## 2019-01-27 ENCOUNTER — Other Ambulatory Visit: Payer: Self-pay | Admitting: Family Medicine

## 2019-02-06 ENCOUNTER — Other Ambulatory Visit: Payer: Self-pay | Admitting: Family Medicine

## 2019-02-06 DIAGNOSIS — F410 Panic disorder [episodic paroxysmal anxiety] without agoraphobia: Secondary | ICD-10-CM

## 2019-02-07 ENCOUNTER — Other Ambulatory Visit: Payer: Self-pay | Admitting: Obstetrics and Gynecology

## 2019-02-10 ENCOUNTER — Other Ambulatory Visit: Payer: Self-pay | Admitting: Obstetrics and Gynecology

## 2019-02-14 ENCOUNTER — Other Ambulatory Visit: Payer: Self-pay | Admitting: Family Medicine

## 2019-03-01 ENCOUNTER — Telehealth: Payer: Self-pay

## 2019-03-01 NOTE — Telephone Encounter (Signed)
Pt request a refill of adipex until her next appt on 10/28.   Pt thought her appt was 9/28 but it is 10/28. Pls advise. ty  CM

## 2019-03-02 ENCOUNTER — Other Ambulatory Visit: Payer: Self-pay | Admitting: Obstetrics and Gynecology

## 2019-03-02 ENCOUNTER — Other Ambulatory Visit: Payer: Self-pay | Admitting: Family Medicine

## 2019-03-02 DIAGNOSIS — F410 Panic disorder [episodic paroxysmal anxiety] without agoraphobia: Secondary | ICD-10-CM

## 2019-03-02 DIAGNOSIS — M25561 Pain in right knee: Secondary | ICD-10-CM

## 2019-03-02 DIAGNOSIS — G8929 Other chronic pain: Secondary | ICD-10-CM

## 2019-03-02 MED ORDER — PHENTERMINE HCL 37.5 MG PO TABS: ORAL_TABLET | ORAL | 0 refills | Status: DC

## 2019-03-02 NOTE — Telephone Encounter (Signed)
pls advise

## 2019-03-02 NOTE — Telephone Encounter (Signed)
One refill sent in to cover until next appointment

## 2019-03-09 ENCOUNTER — Encounter: Payer: BC Managed Care – PPO | Admitting: Obstetrics and Gynecology

## 2019-03-17 ENCOUNTER — Other Ambulatory Visit: Payer: Self-pay | Admitting: Family Medicine

## 2019-03-18 ENCOUNTER — Other Ambulatory Visit: Payer: Self-pay | Admitting: Family Medicine

## 2019-03-18 DIAGNOSIS — F410 Panic disorder [episodic paroxysmal anxiety] without agoraphobia: Secondary | ICD-10-CM

## 2019-03-18 NOTE — Telephone Encounter (Signed)
Requested medication (s) are due for refill today: yes  Requested medication (s) are on the active medication list: yes  Last refill:  03/02/2019  Future visit scheduled: no  Notes to clinic: requesting 90 day supply   Requested Prescriptions  Pending Prescriptions Disp Refills   hydrOXYzine (ATARAX/VISTARIL) 10 MG tablet [Pharmacy Med Name: HYDROXYZINE HCL 10 MG TABLET] 270 tablet 1    Sig: TAKE 1 TABLET BY MOUTH THREE TIMES A DAY AS NEEDED     Ear, Nose, and Throat:  Antihistamines Passed - 03/18/2019  9:56 AM      Passed - Valid encounter within last 12 months    Recent Outpatient Visits          1 month ago Bipolar affective disorder, currently depressed, moderate ([place])   [facility] Steele Sizer, MD   3 months ago Thrombocytosis [facility])   [facility] Steele Sizer, MD   6 months ago Panic attack   [facility] [place], Drue Stager, MD   7 months ago Bipolar affective disorder, currently depressed, moderate [facility])   [facility] Steele Sizer, MD   1 year ago Bipolar affective disorder, currently depressed, moderate [facility])   [facility] Steele Sizer, MD

## 2019-03-19 ENCOUNTER — Other Ambulatory Visit: Payer: Self-pay | Admitting: *Deleted

## 2019-03-19 DIAGNOSIS — D508 Other iron deficiency anemias: Secondary | ICD-10-CM

## 2019-03-29 ENCOUNTER — Other Ambulatory Visit: Payer: Self-pay

## 2019-03-29 ENCOUNTER — Inpatient Hospital Stay (HOSPITAL_BASED_OUTPATIENT_CLINIC_OR_DEPARTMENT_OTHER): Payer: BC Managed Care – PPO | Admitting: Nurse Practitioner

## 2019-03-29 ENCOUNTER — Encounter: Payer: Self-pay | Admitting: Nurse Practitioner

## 2019-03-29 ENCOUNTER — Inpatient Hospital Stay: Payer: BC Managed Care – PPO | Attending: Nurse Practitioner

## 2019-03-29 ENCOUNTER — Inpatient Hospital Stay: Payer: BC Managed Care – PPO

## 2019-03-29 VITALS — BP 138/84 | HR 82 | Temp 98.2°F | Resp 16 | Wt 176.0 lb

## 2019-03-29 DIAGNOSIS — Z79899 Other long term (current) drug therapy: Secondary | ICD-10-CM | POA: Diagnosis not present

## 2019-03-29 DIAGNOSIS — D508 Other iron deficiency anemias: Secondary | ICD-10-CM

## 2019-03-29 DIAGNOSIS — E538 Deficiency of other specified B group vitamins: Secondary | ICD-10-CM | POA: Diagnosis not present

## 2019-03-29 DIAGNOSIS — E669 Obesity, unspecified: Secondary | ICD-10-CM | POA: Insufficient documentation

## 2019-03-29 DIAGNOSIS — Z9884 Bariatric surgery status: Secondary | ICD-10-CM | POA: Diagnosis not present

## 2019-03-29 DIAGNOSIS — J45909 Unspecified asthma, uncomplicated: Secondary | ICD-10-CM | POA: Insufficient documentation

## 2019-03-29 DIAGNOSIS — F419 Anxiety disorder, unspecified: Secondary | ICD-10-CM | POA: Insufficient documentation

## 2019-03-29 DIAGNOSIS — I10 Essential (primary) hypertension: Secondary | ICD-10-CM | POA: Insufficient documentation

## 2019-03-29 DIAGNOSIS — R5383 Other fatigue: Secondary | ICD-10-CM | POA: Insufficient documentation

## 2019-03-29 DIAGNOSIS — E119 Type 2 diabetes mellitus without complications: Secondary | ICD-10-CM | POA: Diagnosis not present

## 2019-03-29 DIAGNOSIS — D509 Iron deficiency anemia, unspecified: Secondary | ICD-10-CM | POA: Insufficient documentation

## 2019-03-29 DIAGNOSIS — E785 Hyperlipidemia, unspecified: Secondary | ICD-10-CM | POA: Insufficient documentation

## 2019-03-29 LAB — COMPREHENSIVE METABOLIC PANEL
ALT: 22 U/L (ref 0–44)
AST: 18 U/L (ref 15–41)
Albumin: 3.8 g/dL (ref 3.5–5.0)
Alkaline Phosphatase: 58 U/L (ref 38–126)
Anion gap: 9 (ref 5–15)
BUN: 10 mg/dL (ref 6–20)
CO2: 26 mmol/L (ref 22–32)
Calcium: 8.9 mg/dL (ref 8.9–10.3)
Chloride: 105 mmol/L (ref 98–111)
Creatinine, Ser: 0.54 mg/dL (ref 0.44–1.00)
GFR calc Af Amer: >60 mL/min (ref >60)
GFR calc non Af Amer: >60 mL/min (ref >60)
Glucose, Bld: 95 mg/dL (ref 70–99)
Potassium: 4 mmol/L (ref 3.5–5.1)
Sodium: 140 mmol/L (ref 135–145)
Total Bilirubin: 0.6 mg/dL (ref 0.3–1.2)
Total Protein: 6.8 g/dL (ref 6.5–8.1)

## 2019-03-29 LAB — CBC WITH DIFFERENTIAL/PLATELET
Abs Immature Granulocytes: 0.01 10*3/uL (ref 0.00–0.07)
Basophils Absolute: 0 10*3/uL (ref 0.0–0.1)
Basophils Relative: 1 %
Eosinophils Absolute: 0.1 10*3/uL (ref 0.0–0.5)
Eosinophils Relative: 1 %
HCT: 36.6 % (ref 36.0–46.0)
Hemoglobin: 12.3 g/dL (ref 12.0–15.0)
Immature Granulocytes: 0 %
Lymphocytes Relative: 35 %
Lymphs Abs: 2.1 10*3/uL (ref 0.7–4.0)
MCH: 31.5 pg (ref 26.0–34.0)
MCHC: 33.6 g/dL (ref 30.0–36.0)
MCV: 93.8 fL (ref 80.0–100.0)
Monocytes Absolute: 0.3 10*3/uL (ref 0.1–1.0)
Monocytes Relative: 5 %
Neutro Abs: 3.4 10*3/uL (ref 1.7–7.7)
Neutrophils Relative %: 58 %
Platelets: 215 10*3/uL (ref 150–400)
RBC: 3.9 MIL/uL (ref 3.87–5.11)
RDW: 13.4 % (ref 11.5–15.5)
WBC: 5.8 10*3/uL (ref 4.0–10.5)
nRBC: 0 % (ref 0.0–0.2)

## 2019-03-29 LAB — IRON AND TIBC
Iron: 76 ug/dL (ref 28–170)
Saturation Ratios: 25 % (ref 10.4–31.8)
TIBC: 309 ug/dL (ref 250–450)
UIBC: 233 ug/dL

## 2019-03-29 LAB — FERRITIN: Ferritin: 68 ng/mL (ref 11–307)

## 2019-03-29 LAB — LACTATE DEHYDROGENASE: LDH: 132 U/L (ref 98–192)

## 2019-03-29 NOTE — Assessment & Plan Note (Addendum)
#  Iron deficiency anemia secondary to gastric bypass/malabsorption- initially hemoglobin 11.5, however, ferritin 4.  She is status post Feraheme x 2.  Tolerated well.  #Labs today reviewed in detail with patient.  Hemoglobin improved to 12.3.  No microcytosis.  Ferritin 68, saturation ratio 25, TIBC 309.  Normal and improved.  Hold Feraheme today.  #Low serum calcium-previous history of low calcium levels.  Check CMP today given leg cramps and history.  Calcium normal.  Follow-up with PCP regarding leg cramps.  # B12 deficiency-secondary to malabsorption. Continue B12 injections for with her PCP.  DISPOSITION:  - Hold IV Feraheme today - Return to clinic in 3 months for labs (CBC, CMP, iron studies with ferritin).  Few days later follow-up with Dr.Brahmanday and possible Feraheme

## 2019-03-29 NOTE — Progress Notes (Signed)
[facility] OFFICE PROGRESS NOTE  Patient Care Team: Steele Sizer, MD as PCP - General (Family Medicine)  CHIEF COMPLAINTS/PURPOSE OF CONSULTATION:    HEMATOLOGY HISTORY  #Iron deficiency/gastric bypass-prior history of iron IV infusions [[facility]]; intolerance to p.o. iron.  # B12 deficiency- on B12 injection/ monthly [with Ob]  # DUODENAL SWITCH [~ 125 pounds weight loss/ Oct 2014; [facility] ]   HISTORY OF PRESENTING ILLNESS: Kiara Le 43 y.o. female very pleasant patient who was referred for iron deficiency anemia, who returns to clinic today for follow-up and consideration of continuation of Feraheme.  She has received 2 doses of IV iron/Feraheme in the interim.  No transfusion reactions.  She continues B12 with her primary care doctor.  Today, she says she felt significantly improved after her iron infusions for about a month and since that time has had some gradual fatigue returning.  No ice cravings or pica.  Has some leg cramps.  Dizziness when changing position.  No shortness of breath.  Denies any blood in her stools or black stools.  No blood in her urine.  No abnormal vaginal bleeding.  She had poor tolerance to oral iron with subsequent constipation.  Review of Systems  Constitutional: Positive for malaise/fatigue. Negative for chills, diaphoresis, fever and weight loss.  HENT: Negative for nosebleeds and sore throat.   Eyes: Negative for double vision.  Respiratory: Negative for cough, hemoptysis, sputum production, shortness of breath and wheezing.   Cardiovascular: Negative for chest pain, palpitations, orthopnea and leg swelling.  Gastrointestinal: Negative for abdominal pain, blood in stool, constipation, diarrhea, heartburn, melena, nausea and vomiting.  Genitourinary: Negative for dysuria, frequency and urgency.  Musculoskeletal: Positive for myalgias. Negative for back pain and joint pain.  Skin: Negative.  Negative for itching and rash.   Neurological: Positive for dizziness. Negative for tingling, focal weakness, weakness and headaches.  Endo/Heme/Allergies: Does not bruise/bleed easily.  Psychiatric/Behavioral: Negative for depression. The patient is not nervous/anxious and does not have insomnia.     MEDICAL HISTORY:  Past Medical History:  Diagnosis Date  . Anxiety   . Asthma   . Bipolar disorder ([place])   . Chronic fatigue   . Chronic low back pain   . Diabetes mellitus without complication ([place])   . Fatty liver disease, nonalcoholic   . History of Helicobacter pylori infection   . Hyperlipidemia   . Hypertension   . Insomnia   . Morbid obesity ([place])   . Panic disorder   . PTSD (post-traumatic stress disorder)   . Vitamin B12 deficiency   . Vitamin D deficiency     SURGICAL HISTORY: Past Surgical History:  Procedure Laterality Date  . ABDOMINAL HYSTERECTOMY    . GASTROPLASTY DUODENAL SWITCH    . PARTIAL HYSTERECTOMY    . SALPINGECTOMY    . TUBAL LIGATION      SOCIAL HISTORY: Social History   Socioeconomic History  . Marital status: Married    Spouse name: Michiel Cowboy  . Number of children: 2  . Years of education: Not on file  . Highest education level: Not on file  Occupational History    Comment: lost job because of COVID-19   Social Needs  . Financial resource strain: Somewhat hard  . Food insecurity    Worry: Never true    Inability: Never true  . Transportation needs    Medical: No    Non-medical: No  Tobacco Use  . Smoking status: Never Smoker  . Smokeless tobacco: Never  Used  Substance and Sexual Activity  . Alcohol use: No    Alcohol/week: 0.0 standard drinks  . Drug use: No  . Sexual activity: Yes    Partners: Male    Birth control/protection: None  Lifestyle  . Physical activity    Days per week: 4 days    Minutes per session: 60 min  . Stress: To some extent  Relationships  . Social Herbalist on phone: Not on file    Gets together: Not on file    Attends  religious service: Not on file    Active member of club or organization: Not on file    Attends meetings of clubs or organizations: Not on file    Relationship status: Not on file  . Intimate partner violence    Fear of current or ex partner: No    Emotionally abused: No    Physically abused: No    Forced sexual activity: No  Other Topics Concern  . Not on file  Social History Narrative   Married; Lost job because of COVID-19; non-smoker; ocassional alcohol. [employer]    FAMILY HISTORY: Family History  Problem Relation Age of Onset  . Heart disease Father   . Hypertension Father   . Hyperlipidemia Father   . Drug abuse Mother   . Anxiety disorder Sister   . Depression Sister   . Breast cancer Neg Hx    No family history of any GI malignancies  ALLERGIES:  is allergic to 2,4-d dimethylamine (amisol); bee venom; gabapentin; nsaids; other; tetanus toxoid adsorbed; and tetanus toxoids.  MEDICATIONS:  Current Outpatient Medications  Medication Sig Dispense Refill  . Cholecalciferol (VITAMIN D) 2000 units CAPS Take 1 capsule (2,000 Units total) by mouth daily. 30 capsule 0  . cyanocobalamin (,VITAMIN B-12,) 1000 MCG/ML injection Inject 1 mL (1,000 mcg total) into the muscle every 30 (thirty) days. 10 mL 3  . cyclobenzaprine (FLEXERIL) 10 MG tablet TAKE 0.5-1 TABLETS (5-10 MG TOTAL) BY MOUTH 3 (THREE) TIMES DAILY AS NEEDED FOR MUSCLE SPASMS. 90 tablet 0  . lidocaine (LIDODERM) 5 % PLACE 2 PATCHES ONTO THE SKIN DAILY. REMOVE & DISCARD PATCH WITHIN 12 HOURS OR AS DIRECTED BY MD 60 patch 0  . Multiple Vitamin (MULTI-VITAMINS) TABS Take by mouth.    . pantoprazole (PROTONIX) 40 MG tablet Take 1 tablet (40 mg total) by mouth daily. 90 tablet 1  . phentermine (ADIPEX-P) 37.5 MG tablet TAKE 1 TAB BY MOUTH TWICE A DAY 60 tablet 0  . DENTAGEL 1.1 % GEL dental gel BRUSH ON TEETH AT BEDTIME DO NOT RINSE    . hydrOXYzine (ATARAX/VISTARIL) 10 MG tablet TAKE 1 TABLET BY MOUTH THREE TIMES A DAY AS  NEEDED (Patient not taking: Reported on 03/29/2019) 90 tablet 0  . pregabalin (LYRICA) 75 MG capsule Take 1-2 capsules (75-150 mg total) by mouth 2 (two) times daily. One in am and 2 in pm (Patient not taking: Reported on 03/29/2019) 90 capsule 1   No current facility-administered medications for this visit.     PHYSICAL EXAMINATION: Vitals:   03/29/19 1337  BP: 138/84  Pulse: 82  Resp: 16  Temp: 98.2 F (36.8 C)   Filed Weights   03/29/19 1337  Weight: 176 lb (79.8 kg)    Physical Exam  Constitutional: She is oriented to person, place, and time and well-developed, well-nourished, and in no distress.  Unaccompanied.  Wearing mask.  HENT:  Head: Normocephalic and atraumatic.  Eyes: Conjunctivae are normal.  No scleral icterus.  Neck: Normal range of motion. Neck supple.  Cardiovascular: Normal rate and regular rhythm.  Pulmonary/Chest: Effort normal. No respiratory distress.  Abdominal: Soft. Bowel sounds are normal. She exhibits no distension.  Musculoskeletal: Normal range of motion.        General: No tenderness or edema.     Comments: Ambulates without aids  Neurological: She is alert and oriented to person, place, and time. Gait normal.  Skin: Skin is warm. No rash noted.  Psychiatric: Mood and affect normal.    LABORATORY DATA:  I have reviewed the data as listed Lab Results  Component Value Date   WBC 5.8 03/29/2019   HGB 12.3 03/29/2019   HCT 36.6 03/29/2019   MCV 93.8 03/29/2019   PLT 215 03/29/2019   Recent Labs    12/14/18 1038 03/29/19 1257  NA 137 140  K 4.1 4.0  CL 105 105  CO2 28 26  GLUCOSE 85 95  BUN 10 10  CREATININE 0.75 0.54  CALCIUM 8.7 8.9  GFRNONAA 100 >60  GFRAA 116 >60  PROT 6.7 6.8  ALBUMIN  --  3.8  AST 18 18  ALT 21 22  ALKPHOS  --  58  BILITOT 0.4 0.6    No results found.   ASSESSMENT & PLAN:  Acquired iron deficiency anemia due to decreased absorption #Iron deficiency anemia secondary to gastric  bypass/malabsorption- initially hemoglobin 11.5, however, ferritin 4.  She is status post Feraheme x 2.  Tolerated well.  #Labs today reviewed in detail with patient.  Hemoglobin improved to 12.3.  No microcytosis.  Ferritin 68, saturation ratio 25, TIBC 309.  Normal and improved.  Hold Feraheme today.  #Low serum calcium-previous history of low calcium levels.  Check CMP today given leg cramps and history.  Calcium normal.  Follow-up with PCP regarding leg cramps.  # B12 deficiency-secondary to malabsorption. Continue B12 injections for with her PCP.  DISPOSITION:  - Hold IV Feraheme today - Return to clinic in 3 months for labs (CBC, CMP, iron studies with ferritin).  Few days later follow-up with Dr.Brahmanday and possible Feraheme  All questions were answered. The patient knows to call the clinic with any problems, questions or concerns.    Verlon Au, NP 03/29/2019 5:14 PM  CC: Dr. Ancil Boozer & Dr. Rogue Bussing

## 2019-03-31 ENCOUNTER — Encounter: Payer: BC Managed Care – PPO | Admitting: Obstetrics and Gynecology

## 2019-06-25 ENCOUNTER — Inpatient Hospital Stay: Payer: BC Managed Care – PPO

## 2019-06-25 ENCOUNTER — Other Ambulatory Visit: Payer: Self-pay

## 2019-06-28 ENCOUNTER — Other Ambulatory Visit: Payer: Self-pay

## 2019-06-28 ENCOUNTER — Inpatient Hospital Stay: Payer: BC Managed Care – PPO

## 2019-06-28 ENCOUNTER — Inpatient Hospital Stay: Payer: BC Managed Care – PPO | Attending: Internal Medicine | Admitting: Internal Medicine

## 2019-06-28 VITALS — BP 104/64 | HR 82 | Resp 18

## 2019-06-28 DIAGNOSIS — J45909 Unspecified asthma, uncomplicated: Secondary | ICD-10-CM | POA: Insufficient documentation

## 2019-06-28 DIAGNOSIS — E119 Type 2 diabetes mellitus without complications: Secondary | ICD-10-CM | POA: Diagnosis not present

## 2019-06-28 DIAGNOSIS — D508 Other iron deficiency anemias: Secondary | ICD-10-CM

## 2019-06-28 DIAGNOSIS — F419 Anxiety disorder, unspecified: Secondary | ICD-10-CM | POA: Diagnosis not present

## 2019-06-28 DIAGNOSIS — E538 Deficiency of other specified B group vitamins: Secondary | ICD-10-CM | POA: Insufficient documentation

## 2019-06-28 DIAGNOSIS — R531 Weakness: Secondary | ICD-10-CM | POA: Insufficient documentation

## 2019-06-28 DIAGNOSIS — M545 Low back pain: Secondary | ICD-10-CM | POA: Insufficient documentation

## 2019-06-28 DIAGNOSIS — F319 Bipolar disorder, unspecified: Secondary | ICD-10-CM | POA: Diagnosis not present

## 2019-06-28 DIAGNOSIS — R5383 Other fatigue: Secondary | ICD-10-CM | POA: Insufficient documentation

## 2019-06-28 DIAGNOSIS — I10 Essential (primary) hypertension: Secondary | ICD-10-CM | POA: Diagnosis not present

## 2019-06-28 DIAGNOSIS — Z79899 Other long term (current) drug therapy: Secondary | ICD-10-CM | POA: Diagnosis not present

## 2019-06-28 DIAGNOSIS — D509 Iron deficiency anemia, unspecified: Secondary | ICD-10-CM | POA: Insufficient documentation

## 2019-06-28 DIAGNOSIS — E559 Vitamin D deficiency, unspecified: Secondary | ICD-10-CM | POA: Diagnosis not present

## 2019-06-28 DIAGNOSIS — E785 Hyperlipidemia, unspecified: Secondary | ICD-10-CM | POA: Insufficient documentation

## 2019-06-28 DIAGNOSIS — E669 Obesity, unspecified: Secondary | ICD-10-CM | POA: Diagnosis not present

## 2019-06-28 DIAGNOSIS — G4700 Insomnia, unspecified: Secondary | ICD-10-CM | POA: Insufficient documentation

## 2019-06-28 DIAGNOSIS — Z9884 Bariatric surgery status: Secondary | ICD-10-CM | POA: Insufficient documentation

## 2019-06-28 LAB — CBC WITH DIFFERENTIAL/PLATELET
Abs Immature Granulocytes: 0.02 10*3/uL (ref 0.00–0.07)
Basophils Absolute: 0 10*3/uL (ref 0.0–0.1)
Basophils Relative: 1 %
Eosinophils Absolute: 0.1 10*3/uL (ref 0.0–0.5)
Eosinophils Relative: 1 %
HCT: 38 % (ref 36.0–46.0)
Hemoglobin: 12.4 g/dL (ref 12.0–15.0)
Immature Granulocytes: 0 %
Lymphocytes Relative: 33 %
Lymphs Abs: 2.2 10*3/uL (ref 0.7–4.0)
MCH: 32 pg (ref 26.0–34.0)
MCHC: 32.6 g/dL (ref 30.0–36.0)
MCV: 98.2 fL (ref 80.0–100.0)
Monocytes Absolute: 0.4 10*3/uL (ref 0.1–1.0)
Monocytes Relative: 5 %
Neutro Abs: 4.1 10*3/uL (ref 1.7–7.7)
Neutrophils Relative %: 60 %
Platelets: 220 10*3/uL (ref 150–400)
RBC: 3.87 MIL/uL (ref 3.87–5.11)
RDW: 11.9 % (ref 11.5–15.5)
WBC: 6.8 10*3/uL (ref 4.0–10.5)
nRBC: 0 % (ref 0.0–0.2)

## 2019-06-28 MED ORDER — SODIUM CHLORIDE 0.9 % IV SOLN
Freq: Once | INTRAVENOUS | Status: AC
  Filled 2019-06-28: qty 250

## 2019-06-28 MED ORDER — SODIUM CHLORIDE 0.9 % IV SOLN
510.0000 mg | Freq: Once | INTRAVENOUS | Status: AC
  Administered 2019-06-28: 510 mg via INTRAVENOUS
  Filled 2019-06-28: qty 17

## 2019-06-28 NOTE — Progress Notes (Signed)
[facility] CONSULT NOTE  Patient Care Team: Steele Sizer, MD as PCP - General (Family Medicine)  CHIEF COMPLAINTS/PURPOSE OF CONSULTATION: Anemia iron deficiency   HEMATOLOGY HISTORY  #Iron deficiency/gastric bypass-prior history of iron IV infusions [[facility]]; intolerance to p.o. iron.  # B12 deficiency- on B12 injection/ monthly [with Ob]  # DUODENAL SWITCH [~ 125 pounds weight loss/ Oct 2014; [facility] ]   HISTORY OF PRESENTING ILLNESS:   Kiara Le 44 y.o.  female is here for follow-up given her anemia/secondary malabsorption gastric surgery.  Patient complains of mild/moderate fatigue.  Otherwise no blood in stools or black or stools.  Review of Systems  Constitutional: Positive for malaise/fatigue. Negative for chills, diaphoresis, fever and weight loss.  HENT: Negative for nosebleeds and sore throat.   Eyes: Negative for double vision.  Respiratory: Negative for cough, hemoptysis, sputum production, shortness of breath and wheezing.   Cardiovascular: Negative for chest pain, palpitations, orthopnea and leg swelling.  Gastrointestinal: Negative for abdominal pain, blood in stool, constipation, diarrhea, heartburn, melena, nausea and vomiting.  Genitourinary: Negative for dysuria, frequency and urgency.  Musculoskeletal: Negative for back pain and joint pain.  Skin: Negative.  Negative for itching and rash.  Neurological: Negative for dizziness, tingling, focal weakness, weakness and headaches.  Endo/Heme/Allergies: Does not bruise/bleed easily.  Psychiatric/Behavioral: Negative for depression. The patient is not nervous/anxious and does not have insomnia.     MEDICAL HISTORY:  Past Medical History:  Diagnosis Date  . Anxiety   . Asthma   . Bipolar disorder ([place])   . Chronic fatigue   . Chronic low back pain   . Diabetes mellitus without complication ([place])   . Fatty liver disease, nonalcoholic   . History of Helicobacter pylori infection   .  Hyperlipidemia   . Hypertension   . Insomnia   . Morbid obesity ([place])   . Panic disorder   . PTSD (post-traumatic stress disorder)   . Vitamin B12 deficiency   . Vitamin D deficiency     SURGICAL HISTORY: Past Surgical History:  Procedure Laterality Date  . ABDOMINAL HYSTERECTOMY    . GASTROPLASTY DUODENAL SWITCH    . PARTIAL HYSTERECTOMY    . SALPINGECTOMY    . TUBAL LIGATION      SOCIAL HISTORY: Social History   Socioeconomic History  . Marital status: Married    Spouse name: Michiel Cowboy  . Number of children: 2  . Years of education: Not on file  . Highest education level: Not on file  Occupational History    Comment: lost job because of COVID-19   Tobacco Use  . Smoking status: Never Smoker  . Smokeless tobacco: Never Used  Substance and Sexual Activity  . Alcohol use: No    Alcohol/week: 0.0 standard drinks  . Drug use: No  . Sexual activity: Yes    Partners: Male    Birth control/protection: None  Other Topics Concern  . Not on file  Social History Narrative   Married; Lost job because of COVID-19; non-smoker; ocassional alcohol. [employer]   Social Determinants of Health   Financial Resource Strain:   . Difficulty of Paying Living Expenses: Not on file  Food Insecurity:   . Worried About Charity fundraiser in the Last Year: Not on file  . Ran Out of Food in the Last Year: Not on file  Transportation Needs:   . Lack of Transportation (Medical): Not on file  . Lack of Transportation (Non-Medical): Not on file  Physical  Activity:   . Days of Exercise per Week: Not on file  . Minutes of Exercise per Session: Not on file  Stress:   . Feeling of Stress : Not on file  Social Connections:   . Frequency of Communication with Friends and Family: Not on file  . Frequency of Social Gatherings with Friends and Family: Not on file  . Attends Religious Services: Not on file  . Active Member of Clubs or Organizations: Not on file  . Attends Archivist  Meetings: Not on file  . Marital Status: Not on file  Intimate Partner Violence:   . Fear of Current or Ex-Partner: Not on file  . Emotionally Abused: Not on file  . Physically Abused: Not on file  . Sexually Abused: Not on file    FAMILY HISTORY: Family History  Problem Relation Age of Onset  . Heart disease Father   . Hypertension Father   . Hyperlipidemia Father   . Drug abuse Mother   . Anxiety disorder Sister   . Depression Sister   . Breast cancer Neg Hx     ALLERGIES:  is allergic to 2,4-d dimethylamine (amisol); bee venom; gabapentin; nsaids; other; tetanus toxoid adsorbed; and tetanus toxoids.  MEDICATIONS:  Current Outpatient Medications  Medication Sig Dispense Refill  . cyclobenzaprine (FLEXERIL) 10 MG tablet TAKE 0.5-1 TABLETS (5-10 MG TOTAL) BY MOUTH 3 (THREE) TIMES DAILY AS NEEDED FOR MUSCLE SPASMS. 90 tablet 0  . pantoprazole (PROTONIX) 40 MG tablet Take 1 tablet (40 mg total) by mouth daily. 90 tablet 1  . Cholecalciferol (VITAMIN D) 2000 units CAPS Take 1 capsule (2,000 Units total) by mouth daily. (Patient not taking: Reported on 06/25/2019) 30 capsule 0  . cyanocobalamin (,VITAMIN B-12,) 1000 MCG/ML injection Inject 1 mL (1,000 mcg total) into the muscle every 30 (thirty) days. 10 mL 3  . DENTAGEL 1.1 % GEL dental gel BRUSH ON TEETH AT BEDTIME DO NOT RINSE    . hydrOXYzine (ATARAX/VISTARIL) 10 MG tablet TAKE 1 TABLET BY MOUTH THREE TIMES A DAY AS NEEDED (Patient not taking: Reported on 03/29/2019) 90 tablet 0  . lidocaine (LIDODERM) 5 % PLACE 2 PATCHES ONTO THE SKIN DAILY. REMOVE & DISCARD PATCH WITHIN 12 HOURS OR AS DIRECTED BY MD (Patient not taking: Reported on 06/25/2019) 60 patch 0  . Multiple Vitamin (MULTI-VITAMINS) TABS Take by mouth.    . phentermine (ADIPEX-P) 37.5 MG tablet TAKE 1 TAB BY MOUTH TWICE A DAY (Patient not taking: Reported on 06/25/2019) 60 tablet 0  . pregabalin (LYRICA) 75 MG capsule Take 1-2 capsules (75-150 mg total) by mouth 2 (two)  times daily. One in am and 2 in pm (Patient not taking: Reported on 03/29/2019) 90 capsule 1   No current facility-administered medications for this visit.      PHYSICAL EXAMINATION:   Vitals:   06/28/19 1345  BP: 122/78  Pulse: 79  Resp: 20  Temp: 98.5 F (36.9 C)   Filed Weights   06/28/19 1354  Weight: 178 lb (80.7 kg)    Physical Exam  Constitutional: She is oriented to person, place, and time and well-developed, well-nourished, and in no distress.  HENT:  Head: Normocephalic and atraumatic.  Mouth/Throat: Oropharynx is clear and moist. No oropharyngeal exudate.  Eyes: Pupils are equal, round, and reactive to light.  Cardiovascular: Normal rate and regular rhythm.  Pulmonary/Chest: No respiratory distress. She has no wheezes.  Abdominal: Soft. Bowel sounds are normal. She exhibits no distension and no mass. There  is no abdominal tenderness. There is no rebound and no guarding.  Musculoskeletal:        General: No tenderness or edema. Normal range of motion.     Cervical back: Normal range of motion and neck supple.  Neurological: She is alert and oriented to person, place, and time.  Skin: Skin is warm.  Psychiatric: Affect normal.    LABORATORY DATA:  I have reviewed the data as listed Lab Results  Component Value Date   WBC 5.8 03/29/2019   HGB 12.3 03/29/2019   HCT 36.6 03/29/2019   MCV 93.8 03/29/2019   PLT 215 03/29/2019   Recent Labs    12/14/18 1038 03/29/19 1257  NA 137 140  K 4.1 4.0  CL 105 105  CO2 28 26  GLUCOSE 85 95  BUN 10 10  CREATININE 0.75 0.54  CALCIUM 8.7 8.9  GFRNONAA 100 >60  GFRAA 116 >60  PROT 6.7 6.8  ALBUMIN  --  3.8  AST 18 18  ALT 21 22  ALKPHOS  --  58  BILITOT 0.4 0.6     No results found.  Acquired iron deficiency anemia due to decreased absorption #Iron deficiency anemia secondary to gastric bypass/malabsorption-July 2020 hemoglobin 11.5, however, ferritin 4.  #Await labs from today.  Proceed with IV  iron infusion today.  # B12 deficiency-secondary to [employer] labs.  Continue B12 injections for with her PCP.  DISPOSITION:  # TODAY-labs-cbc/iron studies/ferritin/b12 levels # proceed with IV Feraheme today #  Follow up 6 months- MD;possible Ferrahem. Labs- CBC, CMP, iron studies with ferritin--Dr.B  All questions were answered. The patient knows to call the clinic with any problems, questions or concerns.    Cammie Sickle, MD 06/28/2019 2:27 PM

## 2019-06-28 NOTE — Assessment & Plan Note (Addendum)
#  Iron deficiency anemia secondary to gastric bypass/malabsorption-July 2020 hemoglobin 11.5, however, ferritin 4.  #Await labs from today.  Proceed with IV iron infusion today.  # B12 deficiency-secondary to [employer] labs.  Continue B12 injections for with her PCP.  DISPOSITION:  # TODAY-labs-cbc/iron studies/ferritin/b12 levels # proceed with IV Feraheme today #  Follow up 6 months- MD;possible Ferrahem. Labs- CBC, CMP, iron studies with ferritin--Dr.B

## 2019-06-29 ENCOUNTER — Other Ambulatory Visit: Payer: Self-pay | Admitting: Family Medicine

## 2019-06-29 NOTE — Telephone Encounter (Signed)
Requested medication (s) are due for refill today: yes  Requested medication (s) are on the active medication list: yes  Last refill:  03/17/2019  Future visit scheduled: no This refill cannot be delegated Notes to clinic:     Requested Prescriptions  Pending Prescriptions Disp Refills   cyclobenzaprine (FLEXERIL) 10 MG tablet [Pharmacy Med Name: CYCLOBENZAPRINE 10 MG TABLET] 90 tablet 0    Sig: TAKE 0.5-1 TABLETS (5-10 MG TOTAL) BY MOUTH 3 (THREE) TIMES DAILY AS NEEDED FOR MUSCLE SPASMS.      Not Delegated - Analgesics:  Muscle Relaxants Failed - 06/29/2019 10:56 AM      Failed - This refill cannot be delegated      Passed - Valid encounter within last 6 months    Recent Outpatient Visits           5 months ago Bipolar affective disorder, currently depressed, moderate ([place])   [facility] Steele Sizer, MD   6 months ago Thrombocytosis [facility])   [facility] Steele Sizer, MD   10 months ago Panic attack   [facility] [place], Drue Stager, MD   11 months ago Bipolar affective disorder, currently depressed, moderate [facility])   [facility] Steele Sizer, MD   1 year ago Bipolar affective disorder, currently depressed, moderate [facility])   [facility] Steele Sizer, MD

## 2019-07-01 ENCOUNTER — Other Ambulatory Visit: Payer: Self-pay | Admitting: Family Medicine

## 2019-08-16 ENCOUNTER — Ambulatory Visit: Payer: Self-pay | Admitting: *Deleted

## 2019-08-16 ENCOUNTER — Encounter: Payer: Self-pay | Admitting: *Deleted

## 2019-08-16 ENCOUNTER — Telehealth: Payer: Self-pay | Admitting: *Deleted

## 2019-08-16 DIAGNOSIS — D508 Other iron deficiency anemias: Secondary | ICD-10-CM

## 2019-08-16 NOTE — Telephone Encounter (Signed)
Dr. Rogue Bussing / Lauren- Please advise. Patient has h/o iron def. Anemia. (see triage note from pcp's office-as patient reported "dizziness and feeling off." She explained "I feel like this when I'm dehydrated or my iron is low.")

## 2019-08-16 NOTE — Telephone Encounter (Signed)
Please order - Labs- cbc/cmp/iron studies/ferritin; and if pt is interested [facility] visit. Lauren- what do you think? Thanks GB

## 2019-08-16 NOTE — Telephone Encounter (Signed)
-----   Message from Wallene D[facility] sent at 08/16/2019  4:17 PM EDT ----- Regarding: Dizziness Pt left VM that her PCP wanted her to follow-up with you Dr. B due to dizzy spells. She has been having them all day. Pt is requesting a call back.  Thank you Jerene Pitch

## 2019-08-16 NOTE — Telephone Encounter (Signed)
Pt called in c/o being dizzy and "feeling off" that started this morning.  Her BP is 108/68 which is low for her she usually runs around 120/80.    She is being treated at the [facility] for iron deficiency.   "I feel like this when I'm dehydrated or my iron is low".  As I was searching the schedule for an appt with Dr. Ancil Boozer she stated she would like to call her doctor at the [facility] first.   I let her know to please call us back for an appt to see someone today if things did not work out for her to see someone at the [facility]    She assured me she would call back if she could not be seen at the [facility] today.  See triage notes.  I instructed her to go to the ED if she passed out.   I went over the care advice.   She was agreeable with the advice.  I sent my notes to Dr. Ancil Boozer' office.      Reason for Disposition . [1] MODERATE dizziness (e.g., interferes with normal activities) AND [2] has NOT been evaluated by physician for this  (Exception: dizziness caused by heat exposure, sudden standing, or poor fluid intake)  Answer Assessment - Initial Assessment Questions 1. DESCRIPTION: "Describe your dizziness."     I wasn't feeling good when I got up this morning.   I get really dizzy when I bend over.   My BP is low.   My head was spinning so I laid my head down on my desk.   2. LIGHTHEADED: "Do you feel lightheaded?" (e.g., somewhat faint, woozy, weak upon standing)     Yes.   I very unsteady.    I feel off.   I had to grab the counter when I bent over because I thought I might pass out. 3. VERTIGO: "Do you feel like either you or the room is spinning or tilting?" (i.e. vertigo)     No 4. SEVERITY: "How bad is it?"  "Do you feel like you are going to faint?" "Can you stand and walk?"   - MILD - walking normally   - MODERATE - interferes with normal activities (e.g., work, school)    - SEVERE - unable to stand, requires support to walk, feels like passing out now.   Moderate 5. ONSET:  "When did the dizziness begin?"     This morning I've been seen at [facility] for this dizziness due to dehydration.      I'm getting iron infusions at [facility].   This last one was in January.   My iron was off a little.   I have low iron and had dizziness then. 6. AGGRAVATING FACTORS: "Does anything make it worse?" (e.g., standing, change in head position)     Bending over 7. HEART RATE: "Can you tell me your heart rate?" "How many beats in 15 seconds?"  (Note: not all patients can do this)       108/68 this morning.   My normal is around 120/80. 8. CAUSE: "What do you think is causing the dizziness?"     I feel like I'm dehydrated.   I'm drinking a lot of fluids so I don't think it's that. 9. RECURRENT SYMPTOM: "Have you had dizziness before?" If so, ask: "When was the last time?" "What happened that time?"     Yes   See above 10. OTHER SYMPTOMS: "Do you have any  other symptoms?" (e.g., fever, chest pain, vomiting, diarrhea, bleeding)       No nausea or diarrhea or fever. 11. PREGNANCY: "Is there any chance you are pregnant?" "When was your last menstrual period?"       No!    I've had apartial hysterectomy.  Protocols used: DIZZINESS [facility]

## 2019-08-17 ENCOUNTER — Other Ambulatory Visit: Payer: Self-pay | Admitting: *Deleted

## 2019-08-17 ENCOUNTER — Inpatient Hospital Stay (HOSPITAL_BASED_OUTPATIENT_CLINIC_OR_DEPARTMENT_OTHER): Payer: BC Managed Care – PPO | Admitting: Nurse Practitioner

## 2019-08-17 ENCOUNTER — Other Ambulatory Visit: Payer: Self-pay | Admitting: Family Medicine

## 2019-08-17 ENCOUNTER — Inpatient Hospital Stay: Payer: BC Managed Care – PPO | Attending: Oncology

## 2019-08-17 VITALS — BP 122/78 | HR 78 | Temp 98.0°F | Resp 16 | Wt 183.0 lb

## 2019-08-17 DIAGNOSIS — Z9884 Bariatric surgery status: Secondary | ICD-10-CM | POA: Diagnosis not present

## 2019-08-17 DIAGNOSIS — D509 Iron deficiency anemia, unspecified: Secondary | ICD-10-CM | POA: Insufficient documentation

## 2019-08-17 DIAGNOSIS — I10 Essential (primary) hypertension: Secondary | ICD-10-CM | POA: Diagnosis not present

## 2019-08-17 DIAGNOSIS — F319 Bipolar disorder, unspecified: Secondary | ICD-10-CM | POA: Insufficient documentation

## 2019-08-17 DIAGNOSIS — G4700 Insomnia, unspecified: Secondary | ICD-10-CM | POA: Insufficient documentation

## 2019-08-17 DIAGNOSIS — F4310 Post-traumatic stress disorder, unspecified: Secondary | ICD-10-CM | POA: Insufficient documentation

## 2019-08-17 DIAGNOSIS — R42 Dizziness and giddiness: Secondary | ICD-10-CM

## 2019-08-17 DIAGNOSIS — E785 Hyperlipidemia, unspecified: Secondary | ICD-10-CM | POA: Diagnosis not present

## 2019-08-17 DIAGNOSIS — J45909 Unspecified asthma, uncomplicated: Secondary | ICD-10-CM | POA: Insufficient documentation

## 2019-08-17 DIAGNOSIS — K760 Fatty (change of) liver, not elsewhere classified: Secondary | ICD-10-CM | POA: Insufficient documentation

## 2019-08-17 DIAGNOSIS — D508 Other iron deficiency anemias: Secondary | ICD-10-CM

## 2019-08-17 DIAGNOSIS — G8929 Other chronic pain: Secondary | ICD-10-CM

## 2019-08-17 DIAGNOSIS — E119 Type 2 diabetes mellitus without complications: Secondary | ICD-10-CM | POA: Diagnosis not present

## 2019-08-17 DIAGNOSIS — K219 Gastro-esophageal reflux disease without esophagitis: Secondary | ICD-10-CM

## 2019-08-17 DIAGNOSIS — E538 Deficiency of other specified B group vitamins: Secondary | ICD-10-CM | POA: Diagnosis not present

## 2019-08-17 DIAGNOSIS — F419 Anxiety disorder, unspecified: Secondary | ICD-10-CM | POA: Insufficient documentation

## 2019-08-17 DIAGNOSIS — E669 Obesity, unspecified: Secondary | ICD-10-CM | POA: Diagnosis not present

## 2019-08-17 DIAGNOSIS — E559 Vitamin D deficiency, unspecified: Secondary | ICD-10-CM | POA: Diagnosis not present

## 2019-08-17 LAB — CBC WITH DIFFERENTIAL/PLATELET
Abs Immature Granulocytes: 0.02 10*3/uL (ref 0.00–0.07)
Basophils Absolute: 0 10*3/uL (ref 0.0–0.1)
Basophils Relative: 1 %
Eosinophils Absolute: 0.1 10*3/uL (ref 0.0–0.5)
Eosinophils Relative: 2 %
HCT: 37 % (ref 36.0–46.0)
Hemoglobin: 12.5 g/dL (ref 12.0–15.0)
Immature Granulocytes: 0 %
Lymphocytes Relative: 29 %
Lymphs Abs: 2.1 10*3/uL (ref 0.7–4.0)
MCH: 32.5 pg (ref 26.0–34.0)
MCHC: 33.8 g/dL (ref 30.0–36.0)
MCV: 96.1 fL (ref 80.0–100.0)
Monocytes Absolute: 0.4 10*3/uL (ref 0.1–1.0)
Monocytes Relative: 6 %
Neutro Abs: 4.6 10*3/uL (ref 1.7–7.7)
Neutrophils Relative %: 62 %
Platelets: 217 10*3/uL (ref 150–400)
RBC: 3.85 MIL/uL — ABNORMAL LOW (ref 3.87–5.11)
RDW: 12 % (ref 11.5–15.5)
WBC: 7.3 10*3/uL (ref 4.0–10.5)
nRBC: 0 % (ref 0.0–0.2)

## 2019-08-17 LAB — IRON AND TIBC
Iron: 53 ug/dL (ref 28–170)
Saturation Ratios: 19 % (ref 10.4–31.8)
TIBC: 281 ug/dL (ref 250–450)
UIBC: 228 ug/dL

## 2019-08-17 LAB — COMPREHENSIVE METABOLIC PANEL
ALT: 34 U/L (ref 0–44)
AST: 18 U/L (ref 15–41)
Albumin: 4 g/dL (ref 3.5–5.0)
Alkaline Phosphatase: 60 U/L (ref 38–126)
Anion gap: 7 (ref 5–15)
BUN: 12 mg/dL (ref 6–20)
CO2: 26 mmol/L (ref 22–32)
Calcium: 8.4 mg/dL — ABNORMAL LOW (ref 8.9–10.3)
Chloride: 106 mmol/L (ref 98–111)
Creatinine, Ser: 0.49 mg/dL (ref 0.44–1.00)
GFR calc Af Amer: >60 mL/min (ref >60)
GFR calc non Af Amer: >60 mL/min (ref >60)
Glucose, Bld: 91 mg/dL (ref 70–99)
Potassium: 4.2 mmol/L (ref 3.5–5.1)
Sodium: 139 mmol/L (ref 135–145)
Total Bilirubin: 0.6 mg/dL (ref 0.3–1.2)
Total Protein: 6.6 g/dL (ref 6.5–8.1)

## 2019-08-17 LAB — FERRITIN: Ferritin: 90 ng/mL (ref 11–307)

## 2019-08-17 MED ORDER — MECLIZINE HCL 25 MG PO TABS: 25.0000 mg | ORAL_TABLET | Freq: Three times a day (TID) | ORAL | 0 refills | Status: DC | PRN

## 2019-08-17 NOTE — Patient Instructions (Signed)
Dizziness Dizziness is a common problem. It is a feeling of unsteadiness or light-headedness. You may feel like you are about to faint. Dizziness can lead to injury if you stumble or fall. Anyone can become dizzy, but dizziness is more common in older adults. This condition can be caused by a number of things, including medicines, dehydration, or illness. Follow these instructions at home: Eating and drinking  Drink enough fluid to keep your urine clear or pale yellow. This helps to keep you from becoming dehydrated. Try to drink more clear fluids, such as water.  Do not drink alcohol.  Limit your caffeine intake if told to do so by your health care provider. Check ingredients and nutrition facts to see if a food or beverage contains caffeine.  Limit your salt (sodium) intake if told to do so by your health care provider. Check ingredients and nutrition facts to see if a food or beverage contains sodium. Activity  Avoid making quick movements. ? Rise slowly from chairs and steady yourself until you feel okay. ? In the morning, first sit up on the side of the bed. When you feel okay, stand slowly while you hold onto something until you know that your balance is fine.  If you need to stand in one place for a long time, move your legs often. Tighten and relax the muscles in your legs while you are standing.  Do not drive or use heavy machinery if you feel dizzy.  Avoid bending down if you feel dizzy. Place items in your home so that they are easy for you to reach without leaning over. Lifestyle  Do not use any products that contain nicotine or tobacco, such as cigarettes and e-cigarettes. If you need help quitting, ask your health care provider.  Try to reduce your stress level by using methods such as yoga or meditation. Talk with your health care provider if you need help to manage your stress. General instructions  Watch your dizziness for any changes.  Take over-the-counter and  prescription medicines only as told by your health care provider. Talk with your health care provider if you think that your dizziness is caused by a medicine that you are taking.  Tell a friend or a family member that you are feeling dizzy. If he or she notices any changes in your behavior, have this person call your health care provider.  Keep all follow-up visits as told by your health care provider. This is important. Contact a health care provider if:  Your dizziness does not go away.  Your dizziness or light-headedness gets worse.  You feel nauseous.  You have reduced hearing.  You have new symptoms.  You are unsteady on your feet or you feel like the room is spinning. Get help right away if:  You vomit or have diarrhea and are unable to eat or drink anything.  You have problems talking, walking, swallowing, or using your arms, hands, or legs.  You feel generally weak.  You are not thinking clearly or you have trouble forming sentences. It may take a friend or family member to notice this.  You have chest pain, abdominal pain, shortness of breath, or sweating.  Your vision changes.  You have any bleeding.  You have a severe headache.  You have neck pain or a stiff neck.  You have a fever. These symptoms may represent a serious problem that is an emergency. Do not wait to see if the symptoms will go away. Get medical help   right away. Call your local emergency services (911 in the [place] Do not drive yourself to the hospital. Summary  Dizziness is a feeling of unsteadiness or light-headedness. This condition can be caused by a number of things, including medicines, dehydration, or illness.  Anyone can become dizzy, but dizziness is more common in older adults.  Drink enough fluid to keep your urine clear or pale yellow. Do not drink alcohol.  Avoid making quick movements if you feel dizzy. Monitor your dizziness for any changes. This information is not intended to  replace advice given to you by your health care provider. Make sure you discuss any questions you have with your health care provider. Document Revised: 05/23/2017 Document Reviewed: 06/22/2016 Elsevier Patient Education  2020 Elsevier Inc.  

## 2019-08-17 NOTE — Progress Notes (Signed)
Symptom Management [facility]  [phone] [phone]  Patient Care Team: Steele Sizer, MD as PCP - General (Family Medicine)   Name of the patient: Kiara Le  [id number]  03-12-1976   Date of visit: 08/17/19  Diagnosis-iron deficiency anemia  Chief complaint/ Reason for visit-dizziness  Heme History: Iron deficiency anemia secondary to gastric bypass/malabsorption on July 2020.  B12 deficiency secondary to malabsorption.  She initiated Feraheme in August 2020 and is s/p 3 doses. Last on 06/28/19. She receives B12 injections with her PCP.   Interval history- Kiara Le, 44 year old female diagnosed with iron deficiency anemia secondary to malabsorption from gastric bypass surgery, presents to symptom management clinic for reports of dizziness which started yesterday and have persisted since that time.  She describes sensation of feeling lightheaded and feeling as though the room is spinning.  Symptoms occur at rest and with movement.  Unsure of anything that exacerbate symptoms.  No associated headache, aural pressure, otalgia, otorrhea, tinnitus, or hearing loss.  Denies history of vertigo.  She has tried some over-the-counter medications at home without improvement in her symptoms.  Has tried increasing fluids.  Questions if she needs iron infusion or IV fluids.  Eating and drinking normally.  No secondary nausea or vomiting or diarrhea.  No trauma.  No vision changes.  Review of systems- Review of Systems  Constitutional: Negative for chills, fever, malaise/fatigue and weight loss.  HENT: Negative for hearing loss, nosebleeds, sore throat and tinnitus.   Eyes: Negative for blurred vision and double vision.  Respiratory: Negative for cough, hemoptysis, shortness of breath and wheezing.   Cardiovascular: Negative for chest pain, palpitations and leg swelling.  Gastrointestinal: Negative for abdominal pain, blood in stool,  constipation, diarrhea, melena, nausea and vomiting.  Genitourinary: Negative for dysuria and urgency.  Musculoskeletal: Negative for back pain, falls, joint pain and myalgias.  Skin: Negative for itching and rash.  Neurological: Positive for dizziness. Negative for tingling, sensory change, loss of consciousness, weakness and headaches.  Endo/Heme/Allergies: Negative for environmental allergies. Does not bruise/bleed easily.  Psychiatric/Behavioral: Negative for depression. The patient is not nervous/anxious and does not have insomnia.      Allergies  Allergen Reactions  . 2,4-D Dimethylamine (Amisol) Swelling  . Bee Venom Swelling  . Gabapentin Diarrhea    Hematochezia in her stools after taking this medication   . Nsaids Other (See Comments)    Cannot take due to abd surgery  . Other Hives and Other (See Comments)    Cannot take due to abd surgery Cannot take due to abd surgery  . Tetanus Toxoid Adsorbed Other (See Comments)    Swelling, hot  . Tetanus Toxoids     Swelling, hot    Past Medical History:  Diagnosis Date  . Anxiety   . Asthma   . Bipolar disorder ([place])   . Chronic fatigue   . Chronic low back pain   . Diabetes mellitus without complication ([place])   . Fatty liver disease, nonalcoholic   . History of Helicobacter pylori infection   . Hyperlipidemia   . Hypertension   . Insomnia   . Morbid obesity ([place])   . Panic disorder   . PTSD (post-traumatic stress disorder)   . Vitamin B12 deficiency   . Vitamin D deficiency     Past Surgical History:  Procedure Laterality Date  . ABDOMINAL HYSTERECTOMY    . GASTROPLASTY DUODENAL SWITCH    . PARTIAL HYSTERECTOMY    . SALPINGECTOMY    .  TUBAL LIGATION      Social History   Socioeconomic History  . Marital status: Married    Spouse name: Michiel Cowboy  . Number of children: 2  . Years of education: Not on file  . Highest education level: Not on file  Occupational History    Comment: lost job because of  COVID-19   Tobacco Use  . Smoking status: Never Smoker  . Smokeless tobacco: Never Used  Substance and Sexual Activity  . Alcohol use: No    Alcohol/week: 0.0 standard drinks  . Drug use: No  . Sexual activity: Yes    Partners: Male    Birth control/protection: None  Other Topics Concern  . Not on file  Social History Narrative   Married; Lost job because of COVID-19; non-smoker; ocassional alcohol. [employer]   Social Determinants of Health   Financial Resource Strain:   . Difficulty of Paying Living Expenses:   Food Insecurity:   . Worried About Charity fundraiser in the Last Year:   . Arboriculturist in the Last Year:   Transportation Needs:   . Film/video editor (Medical):   Marland Kitchen Lack of Transportation (Non-Medical):   Physical Activity:   . Days of Exercise per Week:   . Minutes of Exercise per Session:   Stress:   . Feeling of Stress :   Social Connections:   . Frequency of Communication with Friends and Family:   . Frequency of Social Gatherings with Friends and Family:   . Attends Religious Services:   . Active Member of Clubs or Organizations:   . Attends Archivist Meetings:   Marland Kitchen Marital Status:   Intimate Partner Violence:   . Fear of Current or Ex-Partner:   . Emotionally Abused:   Marland Kitchen Physically Abused:   . Sexually Abused:     Family History  Problem Relation Age of Onset  . Heart disease Father   . Hypertension Father   . Hyperlipidemia Father   . Drug abuse Mother   . Anxiety disorder Sister   . Depression Sister   . Breast cancer Neg Hx      Current Outpatient Medications:  .  cyanocobalamin (,VITAMIN B-12,) 1000 MCG/ML injection, Inject 1 mL (1,000 mcg total) into the muscle every 30 (thirty) days., Disp: 10 mL, Rfl: 3 .  cyclobenzaprine (FLEXERIL) 10 MG tablet, TAKE 0.5-1 TABLETS (5-10 MG TOTAL) BY MOUTH 3 (THREE) TIMES DAILY AS NEEDED FOR MUSCLE SPASMS., Disp: 90 tablet, Rfl: 0 .  pantoprazole (PROTONIX) 40 MG tablet, Take 1  tablet (40 mg total) by mouth daily., Disp: 90 tablet, Rfl: 1 .  DENTAGEL 1.1 % GEL dental gel, BRUSH ON TEETH AT BEDTIME DO NOT RINSE, Disp: , Rfl:   Physical exam:  Vitals:   08/17/19 1545  BP: 122/78  Pulse: 78  Resp: 16  Temp: 98 F (36.7 C)  TempSrc: Tympanic  SpO2: 99%  Weight: 183 lb (83 kg)   Physical Exam Constitutional:      General: She is not in acute distress.    Comments: Unaccompanied.  Wearing mask.  HENT:     Head: Normocephalic and atraumatic.  Eyes:     General: No scleral icterus.    Extraocular Movements: Extraocular movements intact.     Conjunctiva/sclera: Conjunctivae normal.     Pupils: Pupils are equal, round, and reactive to light.  Pulmonary:     Effort: Pulmonary effort is normal. No respiratory distress.  Musculoskeletal:  General: No deformity or signs of injury.  Skin:    General: Skin is warm and dry.     Coloration: Skin is not pale.  Neurological:     General: No focal deficit present.     Mental Status: She is alert and oriented to person, place, and time.     Cranial Nerves: No cranial nerve deficit, dysarthria or facial asymmetry.     Sensory: No sensory deficit.     Motor: No weakness or tremor.     Coordination: Coordination normal.     Gait: Gait normal.      CMP Latest Ref Rng & Units 08/17/2019  Glucose 70 - 99 mg/dL 91  BUN 6 - 20 mg/dL 12  Creatinine 0.44 - 1.00 mg/dL 0.49  Sodium 135 - 145 mmol/L 139  Potassium 3.5 - 5.1 mmol/L 4.2  Chloride 98 - 111 mmol/L 106  CO2 22 - 32 mmol/L 26  Calcium 8.9 - 10.3 mg/dL 8.4(L)  Total Protein 6.5 - 8.1 g/dL 6.6  Total Bilirubin 0.3 - 1.2 mg/dL 0.6  Alkaline Phos 38 - 126 U/L 60  AST 15 - 41 U/L 18  ALT 0 - 44 U/L 34   CBC Latest Ref Rng & Units 08/17/2019  WBC 4.0 - 10.5 K/uL 7.3  Hemoglobin 12.0 - 15.0 g/dL 12.5  Hematocrit 36.0 - 46.0 % 37.0  Platelets 150 - 400 K/uL 217    No images are attached to the encounter.  No results found.  Assessment and plan-  Patient is a 44 y.o. female diagnosed with iron deficiency anemia secondary to malabsorption from gastric bypass surgery who presents to symptom management clinic for dizziness.  1.  Dizziness-etiology unclear but question BPPV.  Given history of IDA, checked labs.  Hemoglobin normal at 12.5, MCV MCH, MCHC normal.  Not anemic. Iron studies were normal.  Discussed with Dr.Brahmanday who agrees that symptoms are not likely related to IDA and recommends against iron.  Patient tolerating oral fluids well and recommend oral fluids and follow-up with PCP or neurology for further evaluation if symptoms persist.  Will send prescription for meclizine trial.   Disposition: Follow-up with PCP for further evaluation and management Follow-up with Dr.Brahmanday as scheduled for ongoing management of iron deficiency anemia   Visit Diagnosis 1. Dizziness     Patient expressed understanding and was in agreement with this plan. She also understands that She can call clinic at any time with any questions, concerns, or complaints.   Thank you for allowing me to participate in the care of this very pleasant patient.   Beckey Rutter, DNP, AGNP-[facility]  CC: Dr. Ancil Boozer

## 2019-08-17 NOTE — Telephone Encounter (Signed)
Yes, I spoke to patient via telephone and she is agreeable to come in this afternoon for labs and to see NP in symptom management clinic.

## 2019-08-17 NOTE — Telephone Encounter (Signed)
Agree- happy to see her and let's check some labs. If no clear etiology perhaps a referral to cards or neurology. Doni- could you get her scheduled please?

## 2019-08-17 NOTE — Telephone Encounter (Signed)
Review refill for cyclobenzaprine 10 mg. Med can not be delegated. Lidocaine patch was d/c 09/01/18. Protonix 40 mg prescription expired on 07/28/19. Pt scheduled for virtual visit on 09/719

## 2019-08-18 ENCOUNTER — Encounter: Payer: Self-pay | Admitting: Nurse Practitioner

## 2019-08-19 ENCOUNTER — Telehealth: Payer: Self-pay | Admitting: *Deleted

## 2019-08-19 NOTE — Telephone Encounter (Signed)
Call to patient after receiving message from her that she wanted appointment for IV fluids and that she is still dizzy to inform her per VERBAL ORDER Dr Rogue Bussing that she does not need IV fluids or Iron and that she needs to contact her PCP. She was in agreement with this plan

## 2019-08-24 NOTE — Telephone Encounter (Signed)
-----   Message from Steele Sizer, MD sent at 08/23/2019  4:28 PM EDT ----- Please check on patient, advised follow up with me ----- Message ----- From: Verlon Au, NP Sent: 08/23/2019   3:50 PM EDT To: Steele Sizer, MD  FYI Dr. Ancil Boozer- I saw patient for dizziness. Doesn't appear to be secondary to her iron deficiency anemia. I sent a script for meclizine but recommended that she f/u with you regarding her symptoms. Thanks and please let me know if I can assist further. Beckey Rutter, NP

## 2019-08-24 NOTE — Telephone Encounter (Signed)
Called pt and she will be keeping her 09/08/2019 appt with Dr Ancil Boozer. Pt states she is doing ok.

## 2019-09-08 ENCOUNTER — Telehealth (INDEPENDENT_AMBULATORY_CARE_PROVIDER_SITE_OTHER): Payer: BC Managed Care – PPO | Admitting: Family Medicine

## 2019-09-08 ENCOUNTER — Encounter: Payer: Self-pay | Admitting: Family Medicine

## 2019-09-08 ENCOUNTER — Ambulatory Visit: Payer: Self-pay | Admitting: Family Medicine

## 2019-09-08 ENCOUNTER — Other Ambulatory Visit: Payer: Self-pay

## 2019-09-08 DIAGNOSIS — M5441 Lumbago with sciatica, right side: Secondary | ICD-10-CM

## 2019-09-08 DIAGNOSIS — F4310 Post-traumatic stress disorder, unspecified: Secondary | ICD-10-CM

## 2019-09-08 DIAGNOSIS — Z9884 Bariatric surgery status: Secondary | ICD-10-CM

## 2019-09-08 DIAGNOSIS — K219 Gastro-esophageal reflux disease without esophagitis: Secondary | ICD-10-CM | POA: Diagnosis not present

## 2019-09-08 DIAGNOSIS — E559 Vitamin D deficiency, unspecified: Secondary | ICD-10-CM | POA: Diagnosis not present

## 2019-09-08 DIAGNOSIS — M25561 Pain in right knee: Secondary | ICD-10-CM

## 2019-09-08 DIAGNOSIS — D508 Other iron deficiency anemias: Secondary | ICD-10-CM

## 2019-09-08 DIAGNOSIS — G8929 Other chronic pain: Secondary | ICD-10-CM

## 2019-09-08 DIAGNOSIS — F3132 Bipolar disorder, current episode depressed, moderate: Secondary | ICD-10-CM

## 2019-09-08 DIAGNOSIS — M25551 Pain in right hip: Secondary | ICD-10-CM

## 2019-09-08 MED ORDER — CARIPRAZINE HCL 1.5 MG PO CAPS: 1.5000 mg | ORAL_CAPSULE | Freq: Every day | ORAL | 0 refills | Status: DC

## 2019-09-08 MED ORDER — CYCLOBENZAPRINE HCL 10 MG PO TABS: 5.0000 mg | ORAL_TABLET | Freq: Three times a day (TID) | ORAL | 1 refills | Status: DC | PRN

## 2019-09-08 MED ORDER — PANTOPRAZOLE SODIUM 40 MG PO TBEC: 40.0000 mg | DELAYED_RELEASE_TABLET | Freq: Every day | ORAL | 1 refills | Status: DC

## 2019-09-08 MED ORDER — LIDOCAINE 5 % EX PTCH: 1.0000 | MEDICATED_PATCH | CUTANEOUS | 5 refills | Status: DC
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
## 2019-10-06 DIAGNOSIS — Z6825 Body mass index (BMI) 25.0-25.9, adult: Secondary | ICD-10-CM | POA: Diagnosis not present

## 2019-10-06 DIAGNOSIS — M79641 Pain in right hand: Secondary | ICD-10-CM | POA: Diagnosis not present

## 2019-10-06 DIAGNOSIS — M25531 Pain in right wrist: Secondary | ICD-10-CM | POA: Diagnosis not present

## 2019-10-06 DIAGNOSIS — M778 Other enthesopathies, not elsewhere classified: Secondary | ICD-10-CM | POA: Diagnosis not present

## 2019-12-09 ENCOUNTER — Encounter: Payer: Self-pay | Admitting: Internal Medicine

## 2019-12-10 ENCOUNTER — Other Ambulatory Visit: Payer: Self-pay

## 2019-12-10 ENCOUNTER — Inpatient Hospital Stay: Payer: BC Managed Care – PPO | Attending: Internal Medicine

## 2019-12-10 DIAGNOSIS — D508 Other iron deficiency anemias: Secondary | ICD-10-CM

## 2019-12-10 DIAGNOSIS — D509 Iron deficiency anemia, unspecified: Secondary | ICD-10-CM | POA: Insufficient documentation

## 2019-12-10 LAB — CBC WITH DIFFERENTIAL/PLATELET
Abs Immature Granulocytes: 0.01 10*3/uL (ref 0.00–0.07)
Basophils Absolute: 0 10*3/uL (ref 0.0–0.1)
Basophils Relative: 1 %
Eosinophils Absolute: 0.1 10*3/uL (ref 0.0–0.5)
Eosinophils Relative: 1 %
HCT: 41.4 % (ref 36.0–46.0)
Hemoglobin: 14.2 g/dL (ref 12.0–15.0)
Immature Granulocytes: 0 %
Lymphocytes Relative: 26 %
Lymphs Abs: 1.4 10*3/uL (ref 0.7–4.0)
MCH: 31.2 pg (ref 26.0–34.0)
MCHC: 34.3 g/dL (ref 30.0–36.0)
MCV: 91 fL (ref 80.0–100.0)
Monocytes Absolute: 0.3 10*3/uL (ref 0.1–1.0)
Monocytes Relative: 6 %
Neutro Abs: 3.4 10*3/uL (ref 1.7–7.7)
Neutrophils Relative %: 66 %
Platelets: 217 10*3/uL (ref 150–400)
RBC: 4.55 MIL/uL (ref 3.87–5.11)
RDW: 11.3 % — ABNORMAL LOW (ref 11.5–15.5)
WBC: 5.2 10*3/uL (ref 4.0–10.5)
nRBC: 0 % (ref 0.0–0.2)

## 2019-12-10 LAB — COMPREHENSIVE METABOLIC PANEL
ALT: 32 U/L (ref 0–44)
AST: 21 U/L (ref 15–41)
Albumin: 4 g/dL (ref 3.5–5.0)
Alkaline Phosphatase: 65 U/L (ref 38–126)
Anion gap: 7 (ref 5–15)
BUN: 11 mg/dL (ref 6–20)
CO2: 26 mmol/L (ref 22–32)
Calcium: 8.8 mg/dL — ABNORMAL LOW (ref 8.9–10.3)
Chloride: 108 mmol/L (ref 98–111)
Creatinine, Ser: 0.73 mg/dL (ref 0.44–1.00)
GFR calc Af Amer: >60 mL/min (ref >60)
GFR calc non Af Amer: >60 mL/min (ref >60)
Glucose, Bld: 72 mg/dL (ref 70–99)
Potassium: 4.5 mmol/L (ref 3.5–5.1)
Sodium: 141 mmol/L (ref 135–145)
Total Bilirubin: 0.4 mg/dL (ref 0.3–1.2)
Total Protein: 7.2 g/dL (ref 6.5–8.1)

## 2019-12-10 LAB — IRON AND TIBC
Iron: 57 ug/dL (ref 28–170)
Saturation Ratios: 19 % (ref 10.4–31.8)
TIBC: 302 ug/dL (ref 250–450)
UIBC: 245 ug/dL

## 2019-12-10 LAB — FERRITIN: Ferritin: 74 ng/mL (ref 11–307)

## 2019-12-24 ENCOUNTER — Other Ambulatory Visit: Payer: Self-pay

## 2019-12-24 DIAGNOSIS — D508 Other iron deficiency anemias: Secondary | ICD-10-CM

## 2019-12-27 ENCOUNTER — Other Ambulatory Visit: Payer: Self-pay

## 2019-12-27 ENCOUNTER — Inpatient Hospital Stay: Payer: BC Managed Care – PPO

## 2019-12-27 ENCOUNTER — Inpatient Hospital Stay (HOSPITAL_BASED_OUTPATIENT_CLINIC_OR_DEPARTMENT_OTHER): Payer: BC Managed Care – PPO | Admitting: Internal Medicine

## 2019-12-27 VITALS — BP 124/84 | HR 69 | Temp 97.6°F | Resp 20 | Ht 70.0 in | Wt 186.3 lb

## 2019-12-27 DIAGNOSIS — D508 Other iron deficiency anemias: Secondary | ICD-10-CM | POA: Diagnosis not present

## 2019-12-27 DIAGNOSIS — D519 Vitamin B12 deficiency anemia, unspecified: Secondary | ICD-10-CM

## 2019-12-27 DIAGNOSIS — D509 Iron deficiency anemia, unspecified: Secondary | ICD-10-CM | POA: Diagnosis not present

## 2019-12-27 LAB — FERRITIN: Ferritin: 69 ng/mL (ref 11–307)

## 2019-12-27 LAB — CBC
HCT: 42.2 % (ref 36.0–46.0)
Hemoglobin: 14.2 g/dL (ref 12.0–15.0)
MCH: 31.3 pg (ref 26.0–34.0)
MCHC: 33.6 g/dL (ref 30.0–36.0)
MCV: 93.2 fL (ref 80.0–100.0)
Platelets: 229 10*3/uL (ref 150–400)
RBC: 4.53 MIL/uL (ref 3.87–5.11)
RDW: 11.9 % (ref 11.5–15.5)
WBC: 6.6 10*3/uL (ref 4.0–10.5)
nRBC: 0 % (ref 0.0–0.2)

## 2019-12-27 LAB — COMPREHENSIVE METABOLIC PANEL
ALT: 29 U/L (ref 0–44)
AST: 19 U/L (ref 15–41)
Albumin: 4.6 g/dL (ref 3.5–5.0)
Alkaline Phosphatase: 60 U/L (ref 38–126)
Anion gap: 6 (ref 5–15)
BUN: 12 mg/dL (ref 6–20)
CO2: 27 mmol/L (ref 22–32)
Calcium: 8.9 mg/dL (ref 8.9–10.3)
Chloride: 106 mmol/L (ref 98–111)
Creatinine, Ser: 0.83 mg/dL (ref 0.44–1.00)
GFR calc Af Amer: >60 mL/min (ref >60)
GFR calc non Af Amer: >60 mL/min (ref >60)
Glucose, Bld: 91 mg/dL (ref 70–99)
Potassium: 4.5 mmol/L (ref 3.5–5.1)
Sodium: 139 mmol/L (ref 135–145)
Total Bilirubin: 0.6 mg/dL (ref 0.3–1.2)
Total Protein: 7.8 g/dL (ref 6.5–8.1)

## 2019-12-27 LAB — IRON AND TIBC
Iron: 84 ug/dL (ref 28–170)
Saturation Ratios: 25 % (ref 10.4–31.8)
TIBC: 343 ug/dL (ref 250–450)
UIBC: 259 ug/dL

## 2019-12-27 MED ORDER — "SYRINGE 23G X 1"" 3 ML MISC": 1.0000 | 0 refills | Status: AC

## 2019-12-27 MED ORDER — CYANOCOBALAMIN 1000 MCG/ML IJ SOLN: 1000.0000 ug | Freq: Once | INTRAMUSCULAR | 1 refills | Status: DC

## 2019-12-27 NOTE — Assessment & Plan Note (Addendum)
#  Iron deficiency anemia secondary to gastric bypass/malabsorption-July 2020 hemoglobin-11.5 however, ferritin 4.; July 2021- Hb 14.5; Iron sat-19%; ferritin- 70s.  Hold IV iron infusion today/see below  #Fatigue/question etiology-question B12 deficiency/malabsorption patient unable to refill; will start patient on B12 intramuscular injection; new prescription sent.  DISPOSITION: BD syringes # HOLD ferrahem today;  #  Follow up 6 months- MD;possible Ferrahem. Labs- CBC, CMP, iron studies with ferritin; B12 levels--Dr.B

## 2019-12-27 NOTE — Progress Notes (Signed)
[facility] CONSULT NOTE  Patient Care Team: Steele Sizer, MD as PCP - General (Family Medicine)  CHIEF COMPLAINTS/PURPOSE OF CONSULTATION: Anemia iron deficiency   HEMATOLOGY HISTORY  #Iron deficiency/gastric bypass-prior history of iron IV infusions [[facility]]; intolerance to p.o. iron.  # B12 deficiency- on B12 injection/ monthly [ script sent]  # DUODENAL SWITCH [~ 125 pounds weight loss/ Oct 2014; [facility] ]   HISTORY OF PRESENTING ILLNESS:   Kiara Le 44 y.o.  female is here for follow-up given her anemia/secondary malabsorption gastric surgery.  Patient complains of moderate to severe fatigue.  She denies any blood in stools or black or stools.  States that she was unable to get her B12 injections as her prescriber/OB unavailable.  Review of Systems  Constitutional: Positive for malaise/fatigue. Negative for chills, diaphoresis, fever and weight loss.  HENT: Negative for nosebleeds and sore throat.   Eyes: Negative for double vision.  Respiratory: Negative for cough, hemoptysis, sputum production, shortness of breath and wheezing.   Cardiovascular: Negative for chest pain, palpitations, orthopnea and leg swelling.  Gastrointestinal: Negative for abdominal pain, blood in stool, constipation, diarrhea, heartburn, melena, nausea and vomiting.  Genitourinary: Negative for dysuria, frequency and urgency.  Musculoskeletal: Negative for back pain and joint pain.  Skin: Negative.  Negative for itching and rash.  Neurological: Negative for dizziness, tingling, focal weakness, weakness and headaches.  Endo/Heme/Allergies: Does not bruise/bleed easily.  Psychiatric/Behavioral: Negative for depression. The patient is not nervous/anxious and does not have insomnia.     MEDICAL HISTORY:  Past Medical History:  Diagnosis Date  . Anxiety   . Asthma   . Bipolar disorder ([place])   . Chronic fatigue   . Chronic low back pain   . Diabetes mellitus without complication  ([place])   . Fatty liver disease, nonalcoholic   . History of Helicobacter pylori infection   . Hyperlipidemia   . Hypertension   . Insomnia   . Morbid obesity ([place])   . Panic disorder   . PTSD (post-traumatic stress disorder)   . Vitamin B12 deficiency   . Vitamin D deficiency     SURGICAL HISTORY: Past Surgical History:  Procedure Laterality Date  . ABDOMINAL HYSTERECTOMY    . GASTROPLASTY DUODENAL SWITCH    . PARTIAL HYSTERECTOMY    . SALPINGECTOMY    . TUBAL LIGATION      SOCIAL HISTORY: Social History   Socioeconomic History  . Marital status: Married    Spouse name: Michiel Cowboy  . Number of children: 2  . Years of education: Not on file  . Highest education level: Not on file  Occupational History    Comment: lost job because of COVID-19   Tobacco Use  . Smoking status: Never Smoker  . Smokeless tobacco: Never Used  Vaping Use  . Vaping Use: Never used  Substance and Sexual Activity  . Alcohol use: No    Alcohol/week: 0.0 standard drinks  . Drug use: No  . Sexual activity: Yes    Partners: Male    Birth control/protection: None  Other Topics Concern  . Not on file  Social History Narrative   Married; Lost job because of COVID-19; non-smoker; ocassional alcohol. [employer]   Social Determinants of Health   Financial Resource Strain:   . Difficulty of Paying Living Expenses:   Food Insecurity:   . Worried About Charity fundraiser in the Last Year:   . Arboriculturist in the Last Year:   [employer]  Needs:   . Lack of Transportation (Medical):   Marland Kitchen Lack of Transportation (Non-Medical):   Physical Activity:   . Days of Exercise per Week:   . Minutes of Exercise per Session:   Stress:   . Feeling of Stress :   Social Connections:   . Frequency of Communication with Friends and Family:   . Frequency of Social Gatherings with Friends and Family:   . Attends Religious Services:   . Active Member of Clubs or Organizations:   . Attends Archivist  Meetings:   Marland Kitchen Marital Status:   Intimate Partner Violence:   . Fear of Current or Ex-Partner:   . Emotionally Abused:   Marland Kitchen Physically Abused:   . Sexually Abused:     FAMILY HISTORY: Family History  Problem Relation Age of Onset  . Heart disease Father   . Hypertension Father   . Hyperlipidemia Father   . Drug abuse Mother   . Anxiety disorder Sister   . Depression Sister   . Breast cancer Neg Hx     ALLERGIES:  is allergic to other; 2,4-d dimethylamine (amisol); bee venom; gabapentin; nsaids; tetanus toxoid adsorbed; and tetanus toxoids.  MEDICATIONS:  Current Outpatient Medications  Medication Sig Dispense Refill  . cyclobenzaprine (FLEXERIL) 10 MG tablet Take 0.5-1 tablets (5-10 mg total) by mouth 3 (three) times daily as needed for muscle spasms. 90 tablet 1  . lidocaine (LIDODERM) 5 % Place 1 patch onto the skin daily. Remove & Discard patch within 12 hours or as directed by MD 30 patch 5  . pantoprazole (PROTONIX) 40 MG tablet Take 1 tablet (40 mg total) by mouth daily. 90 tablet 1  . cyanocobalamin (,VITAMIN B-12,) 1000 MCG/ML injection Inject 1 mL (1,000 mcg total) into the muscle once for 1 dose. 1064mcg/1ml Intramuscular once a month 30 mL 1  . DENTAGEL 1.1 % GEL dental gel BRUSH ON TEETH AT BEDTIME DO NOT RINSE (Patient not taking: Reported on 12/27/2019)    . Syringe/Needle, Disp, (SYRINGE 3CC/23GX1") 23G X 1" 3 ML MISC 1 Syringe by Does not apply route every 30 (thirty) days for 12 doses. 12 each 0   No current facility-administered medications for this visit.      PHYSICAL EXAMINATION:   Vitals:   12/27/19 1313  BP: 124/84  Pulse: 69  Resp: 20  Temp: 97.6 F (36.4 C)   Filed Weights   12/27/19 1313  Weight: 186 lb 4.8 oz (84.5 kg)    Physical Exam HENT:     Head: Normocephalic and atraumatic.     Mouth/Throat:     Pharynx: No oropharyngeal exudate.  Eyes:     Pupils: Pupils are equal, round, and reactive to light.  Cardiovascular:     Rate and  Rhythm: Normal rate and regular rhythm.  Pulmonary:     Effort: No respiratory distress.     Breath sounds: No wheezing.  Abdominal:     General: Bowel sounds are normal. There is no distension.     Palpations: Abdomen is soft. There is no mass.     Tenderness: There is no abdominal tenderness. There is no guarding or rebound.  Musculoskeletal:        General: No tenderness. Normal range of motion.     Cervical back: Normal range of motion and neck supple.  Skin:    General: Skin is warm.  Neurological:     Mental Status: She is alert and oriented to person, place, and time.  Psychiatric:  Mood and Affect: Affect normal.     LABORATORY DATA:  I have reviewed the data as listed Lab Results  Component Value Date   WBC 6.6 12/27/2019   HGB 14.2 12/27/2019   HCT 42.2 12/27/2019   MCV 93.2 12/27/2019   PLT 229 12/27/2019   Recent Labs    08/17/19 1443 12/10/19 1129 12/27/19 1247  NA 139 141 139  K 4.2 4.5 4.5  CL 106 108 106  CO2 26 26 27   GLUCOSE 91 72 91  BUN 12 11 12   CREATININE 0.49 0.73 0.83  CALCIUM 8.4* 8.8* 8.9  GFRNONAA >60 >60 >60  GFRAA >60 >60 >60  PROT 6.6 7.2 7.8  ALBUMIN 4.0 4.0 4.6  AST 18 21 19   ALT 34 32 29  ALKPHOS 60 65 60  BILITOT 0.6 0.4 0.6     No results found.  Acquired iron deficiency anemia due to decreased absorption #Iron deficiency anemia secondary to gastric bypass/malabsorption-July 2020 hemoglobin-11.5 however, ferritin 4.; July 2021- Hb 14.5; Iron sat-19%; ferritin- 70s.  Hold IV iron infusion today/see below  #Fatigue/question etiology-question B12 deficiency/malabsorption patient unable to refill; will start patient on B12 intramuscular injection; new prescription sent.  DISPOSITION: BD syringes # HOLD ferrahem today;  #  Follow up 6 months- MD;possible Ferrahem. Labs- CBC, CMP, iron studies with ferritin; B12 levels--Dr.B  All questions were answered. The patient knows to call the clinic with any problems,  questions or concerns.    Cammie Sickle, MD 12/27/2019 1:51 PM

## 2019-12-28 ENCOUNTER — Encounter: Payer: Self-pay | Admitting: Family Medicine

## 2019-12-29 ENCOUNTER — Other Ambulatory Visit: Payer: Self-pay | Admitting: Family Medicine

## 2019-12-29 DIAGNOSIS — Z13820 Encounter for screening for osteoporosis: Secondary | ICD-10-CM

## 2019-12-29 DIAGNOSIS — Z9884 Bariatric surgery status: Secondary | ICD-10-CM

## 2019-12-29 DIAGNOSIS — E559 Vitamin D deficiency, unspecified: Secondary | ICD-10-CM

## 2020-01-03 ENCOUNTER — Other Ambulatory Visit: Payer: Self-pay | Admitting: Family Medicine

## 2020-01-03 DIAGNOSIS — Z13820 Encounter for screening for osteoporosis: Secondary | ICD-10-CM

## 2020-01-03 DIAGNOSIS — Z9884 Bariatric surgery status: Secondary | ICD-10-CM

## 2020-01-07 ENCOUNTER — Other Ambulatory Visit: Payer: Self-pay | Admitting: Family Medicine

## 2020-01-07 DIAGNOSIS — Z1231 Encounter for screening mammogram for malignant neoplasm of breast: Secondary | ICD-10-CM

## 2020-01-27 ENCOUNTER — Ambulatory Visit
Admission: RE | Admit: 2020-01-27 | Discharge: 2020-01-27 | Disposition: A | Discharge status: Home / Self Care | Payer: BC Managed Care – PPO | Source: Ambulatory Visit | Attending: Family Medicine | Admitting: Family Medicine

## 2020-01-27 ENCOUNTER — Other Ambulatory Visit: Payer: Self-pay

## 2020-01-27 DIAGNOSIS — Z9884 Bariatric surgery status: Secondary | ICD-10-CM | POA: Diagnosis present

## 2020-01-27 DIAGNOSIS — Z1231 Encounter for screening mammogram for malignant neoplasm of breast: Secondary | ICD-10-CM

## 2020-01-27 DIAGNOSIS — E559 Vitamin D deficiency, unspecified: Secondary | ICD-10-CM

## 2020-01-27 DIAGNOSIS — Z13820 Encounter for screening for osteoporosis: Secondary | ICD-10-CM | POA: Diagnosis present

## 2020-03-12 ENCOUNTER — Other Ambulatory Visit: Payer: Self-pay | Admitting: Family Medicine

## 2020-03-12 DIAGNOSIS — K219 Gastro-esophageal reflux disease without esophagitis: Secondary | ICD-10-CM

## 2020-03-12 NOTE — Telephone Encounter (Signed)
Requested Prescriptions  Pending Prescriptions Disp Refills  . pantoprazole (PROTONIX) 40 MG tablet [Pharmacy Med Name: PANTOPRAZOLE SOD DR 40 MG TAB] 90 tablet 1    Sig: TAKE 1 TABLET BY MOUTH EVERY DAY     Gastroenterology: Proton Pump Inhibitors Passed - 03/12/2020  1:35 AM      Passed - Valid encounter within last 12 months    Recent Outpatient Visits          6 months ago Vitamin D deficiency   [facility] Steele Sizer, MD   1 year ago Bipolar affective disorder, currently depressed, moderate [facility])   [facility] Steele Sizer, MD   1 year ago Thrombocytosis [facility])   [facility] Steele Sizer, MD   1 year ago Panic attack   [facility] Steele Sizer, MD   1 year ago Bipolar affective disorder, currently depressed, moderate [facility])   [facility] Steele Sizer, MD

## 2020-06-19 ENCOUNTER — Other Ambulatory Visit: Payer: Self-pay | Admitting: Internal Medicine

## 2020-06-27 ENCOUNTER — Encounter: Payer: Self-pay | Admitting: Internal Medicine

## 2020-06-27 ENCOUNTER — Inpatient Hospital Stay (HOSPITAL_BASED_OUTPATIENT_CLINIC_OR_DEPARTMENT_OTHER): Payer: BC Managed Care – PPO | Admitting: Internal Medicine

## 2020-06-27 ENCOUNTER — Inpatient Hospital Stay: Payer: BC Managed Care – PPO | Attending: Internal Medicine

## 2020-06-27 ENCOUNTER — Inpatient Hospital Stay: Payer: BC Managed Care – PPO

## 2020-06-27 DIAGNOSIS — G4700 Insomnia, unspecified: Secondary | ICD-10-CM | POA: Insufficient documentation

## 2020-06-27 DIAGNOSIS — I10 Essential (primary) hypertension: Secondary | ICD-10-CM | POA: Diagnosis not present

## 2020-06-27 DIAGNOSIS — D509 Iron deficiency anemia, unspecified: Secondary | ICD-10-CM | POA: Insufficient documentation

## 2020-06-27 DIAGNOSIS — Z79899 Other long term (current) drug therapy: Secondary | ICD-10-CM | POA: Diagnosis not present

## 2020-06-27 DIAGNOSIS — F419 Anxiety disorder, unspecified: Secondary | ICD-10-CM | POA: Diagnosis not present

## 2020-06-27 DIAGNOSIS — E538 Deficiency of other specified B group vitamins: Secondary | ICD-10-CM | POA: Diagnosis not present

## 2020-06-27 DIAGNOSIS — R5383 Other fatigue: Secondary | ICD-10-CM | POA: Diagnosis not present

## 2020-06-27 DIAGNOSIS — Z9884 Bariatric surgery status: Secondary | ICD-10-CM | POA: Insufficient documentation

## 2020-06-27 DIAGNOSIS — D508 Other iron deficiency anemias: Secondary | ICD-10-CM

## 2020-06-27 DIAGNOSIS — E119 Type 2 diabetes mellitus without complications: Secondary | ICD-10-CM | POA: Insufficient documentation

## 2020-06-27 DIAGNOSIS — E785 Hyperlipidemia, unspecified: Secondary | ICD-10-CM | POA: Diagnosis not present

## 2020-06-27 DIAGNOSIS — J45909 Unspecified asthma, uncomplicated: Secondary | ICD-10-CM | POA: Insufficient documentation

## 2020-06-27 DIAGNOSIS — E669 Obesity, unspecified: Secondary | ICD-10-CM | POA: Insufficient documentation

## 2020-06-27 LAB — COMPREHENSIVE METABOLIC PANEL
ALT: 26 U/L (ref 0–44)
AST: 20 U/L (ref 15–41)
Albumin: 4 g/dL (ref 3.5–5.0)
Alkaline Phosphatase: 58 U/L (ref 38–126)
Anion gap: 8 (ref 5–15)
BUN: 15 mg/dL (ref 6–20)
CO2: 25 mmol/L (ref 22–32)
Calcium: 8.7 mg/dL — ABNORMAL LOW (ref 8.9–10.3)
Chloride: 105 mmol/L (ref 98–111)
Creatinine, Ser: 0.64 mg/dL (ref 0.44–1.00)
GFR, Estimated: >60 mL/min (ref >60)
Glucose, Bld: 87 mg/dL (ref 70–99)
Potassium: 4.4 mmol/L (ref 3.5–5.1)
Sodium: 138 mmol/L (ref 135–145)
Total Bilirubin: 0.5 mg/dL (ref 0.3–1.2)
Total Protein: 7.1 g/dL (ref 6.5–8.1)

## 2020-06-27 LAB — CBC WITH DIFFERENTIAL/PLATELET
Abs Immature Granulocytes: 0.01 10*3/uL (ref 0.00–0.07)
Basophils Absolute: 0.1 10*3/uL (ref 0.0–0.1)
Basophils Relative: 1 %
Eosinophils Absolute: 0.1 10*3/uL (ref 0.0–0.5)
Eosinophils Relative: 1 %
HCT: 40.6 % (ref 36.0–46.0)
Hemoglobin: 13.9 g/dL (ref 12.0–15.0)
Immature Granulocytes: 0 %
Lymphocytes Relative: 31 %
Lymphs Abs: 2 10*3/uL (ref 0.7–4.0)
MCH: 31.7 pg (ref 26.0–34.0)
MCHC: 34.2 g/dL (ref 30.0–36.0)
MCV: 92.7 fL (ref 80.0–100.0)
Monocytes Absolute: 0.3 10*3/uL (ref 0.1–1.0)
Monocytes Relative: 5 %
Neutro Abs: 3.8 10*3/uL (ref 1.7–7.7)
Neutrophils Relative %: 62 %
Platelets: 214 10*3/uL (ref 150–400)
RBC: 4.38 MIL/uL (ref 3.87–5.11)
RDW: 12.1 % (ref 11.5–15.5)
WBC: 6.2 10*3/uL (ref 4.0–10.5)
nRBC: 0 % (ref 0.0–0.2)

## 2020-06-27 LAB — VITAMIN B12: Vitamin B-12: >7500 pg/mL — ABNORMAL HIGH (ref 180–914)

## 2020-06-27 LAB — IRON AND TIBC
Iron: 51 ug/dL (ref 28–170)
Saturation Ratios: 15 % (ref 10.4–31.8)
TIBC: 339 ug/dL (ref 250–450)
UIBC: 288 ug/dL

## 2020-06-27 LAB — FERRITIN: Ferritin: 77 ng/mL (ref 11–307)

## 2020-06-27 NOTE — Assessment & Plan Note (Signed)
#  Iron deficiency anemia secondary to gastric bypass/malabsorption-July 2020 hemoglobin-11.5 however, ferritin 4.; JAN 2022-Hb-13.9; Iron studies/pending.  Hold IV iron infusion today/see below  #Fatigue/question etiology-question B12 deficiency/malabsorption patient unable to refill; will start patient on B12 intramuscular injection q Monthly.   # Insomnia- ? Anxiety; has tried melatonin in past- defer to PCP.   DISPOSITION:  # HOLD venofer  #  Follow up 6 months- MD;possible venofer. Labs- CBC, CMP, iron studies with ferritin; B12 levels--Dr.B

## 2020-06-27 NOTE — Progress Notes (Signed)
[facility] CONSULT NOTE  Patient Care Team: Steele Sizer, MD as PCP - General (Family Medicine)  CHIEF COMPLAINTS/PURPOSE OF CONSULTATION: Anemia iron deficiency   HEMATOLOGY HISTORY  #Iron deficiency/gastric bypass-prior history of iron IV infusions [[facility]]; intolerance to p.o. iron.  # B12 deficiency- on B12 injection/ monthly [ script sent]  # DUODENAL SWITCH [~ 125 pounds weight loss/ Oct 2014; [facility] ]   HISTORY OF PRESENTING ILLNESS:   Kiara Le 45 y.o.  female is here for follow-up given her anemia/secondary malabsorption gastric surgery.   Patient continues to complain of ongoing fatigue.  She attributes this to difficulty sleeping at night/insomnia.  Otherwise denies any blood in stools or black or stools.  She continues to be on B12 injections on a monthly basis.  Review of Systems  Constitutional: Positive for malaise/fatigue. Negative for chills, diaphoresis, fever and weight loss.  HENT: Negative for nosebleeds and sore throat.   Eyes: Negative for double vision.  Respiratory: Negative for cough, hemoptysis, sputum production, shortness of breath and wheezing.   Cardiovascular: Negative for chest pain, palpitations, orthopnea and leg swelling.  Gastrointestinal: Negative for abdominal pain, blood in stool, constipation, diarrhea, heartburn, melena, nausea and vomiting.  Genitourinary: Negative for dysuria, frequency and urgency.  Musculoskeletal: Negative for back pain and joint pain.  Skin: Negative.  Negative for itching and rash.  Neurological: Negative for dizziness, tingling, focal weakness, weakness and headaches.  Endo/Heme/Allergies: Does not bruise/bleed easily.  Psychiatric/Behavioral: Negative for depression. The patient is not nervous/anxious and does not have insomnia.     MEDICAL HISTORY:  Past Medical History:  Diagnosis Date  . Anxiety   . Asthma   . Bipolar disorder ([place])   . Chronic fatigue   . Chronic low back pain    . Diabetes mellitus without complication ([place])   . Fatty liver disease, nonalcoholic   . History of Helicobacter pylori infection   . Hyperlipidemia   . Hypertension   . Insomnia   . Morbid obesity ([place])   . Panic disorder   . PTSD (post-traumatic stress disorder)   . Vitamin B12 deficiency   . Vitamin D deficiency     SURGICAL HISTORY: Past Surgical History:  Procedure Laterality Date  . ABDOMINAL HYSTERECTOMY    . GASTROPLASTY DUODENAL SWITCH    . PARTIAL HYSTERECTOMY    . SALPINGECTOMY    . TUBAL LIGATION      SOCIAL HISTORY: Social History   Socioeconomic History  . Marital status: Married    Spouse name: Michiel Cowboy  . Number of children: 2  . Years of education: Not on file  . Highest education level: Not on file  Occupational History    Comment: lost job because of COVID-19   Tobacco Use  . Smoking status: Never Smoker  . Smokeless tobacco: Never Used  Vaping Use  . Vaping Use: Never used  Substance and Sexual Activity  . Alcohol use: No    Alcohol/week: 0.0 standard drinks  . Drug use: No  . Sexual activity: Yes    Partners: Male    Birth control/protection: None  Other Topics Concern  . Not on file  Social History Narrative   Married; Lost job because of COVID-19; non-smoker; ocassional alcohol. [employer]   Social Determinants of Health   Financial Resource Strain: Not on file  Food Insecurity: Not on file  Transportation Needs: Not on file  Physical Activity: Not on file  Stress: Not on file  Social Connections: Not on file  Intimate Partner Violence: Not on file    FAMILY HISTORY: Family History  Problem Relation Age of Onset  . Heart disease Father   . Hypertension Father   . Hyperlipidemia Father   . Drug abuse Mother   . Anxiety disorder Sister   . Depression Sister   . Breast cancer Neg Hx     ALLERGIES:  is allergic to other; 2,4-d dimethylamine (amisol); bee venom; gabapentin; nsaids; tetanus toxoid adsorbed; and tetanus  toxoids.  MEDICATIONS:  Current Outpatient Medications  Medication Sig Dispense Refill  . cyanocobalamin (,VITAMIN B-12,) 1000 MCG/ML injection Inject as directed See admin instructions.    . cyclobenzaprine (FLEXERIL) 10 MG tablet Take 0.5-1 tablets (5-10 mg total) by mouth 3 (three) times daily as needed for muscle spasms. 90 tablet 1  . lidocaine (LIDODERM) 5 % Place 1 patch onto the skin daily. Remove & Discard patch within 12 hours or as directed by MD 30 patch 5  . pantoprazole (PROTONIX) 40 MG tablet TAKE 1 TABLET BY MOUTH EVERY DAY 90 tablet 1  . Syringe/Needle, Disp, (SYRINGE 3CC/23GX1") 23G X 1" 3 ML MISC 1 Syringe by Does not apply route every 30 (thirty) days for 12 doses. 12 each 0   No current facility-administered medications for this visit.      PHYSICAL EXAMINATION:   Vitals:   06/27/20 1315  BP: 117/76  Pulse: 77  Resp: 16  Temp: 98.4 F (36.9 C)  SpO2: 100%   Filed Weights   06/27/20 1315  Weight: 187 lb (84.8 kg)    Physical Exam HENT:     Head: Normocephalic and atraumatic.     Mouth/Throat:     Pharynx: No oropharyngeal exudate.  Eyes:     Pupils: Pupils are equal, round, and reactive to light.  Cardiovascular:     Rate and Rhythm: Normal rate and regular rhythm.  Pulmonary:     Effort: No respiratory distress.     Breath sounds: No wheezing.  Abdominal:     General: Bowel sounds are normal. There is no distension.     Palpations: Abdomen is soft. There is no mass.     Tenderness: There is no abdominal tenderness. There is no guarding or rebound.  Musculoskeletal:        General: No tenderness. Normal range of motion.     Cervical back: Normal range of motion and neck supple.  Skin:    General: Skin is warm.  Neurological:     Mental Status: She is alert and oriented to person, place, and time.  Psychiatric:        Mood and Affect: Affect normal.     LABORATORY DATA:  I have reviewed the data as listed Lab Results  Component Value  Date   WBC 6.2 06/27/2020   HGB 13.9 06/27/2020   HCT 40.6 06/27/2020   MCV 92.7 06/27/2020   PLT 214 06/27/2020   Recent Labs    08/17/19 1443 12/10/19 1129 12/27/19 1247  NA 139 141 139  K 4.2 4.5 4.5  CL 106 108 106  CO2 26 26 27   GLUCOSE 91 72 91  BUN 12 11 12   CREATININE 0.49 0.73 0.83  CALCIUM 8.4* 8.8* 8.9  GFRNONAA >60 >60 >60  GFRAA >60 >60 >60  PROT 6.6 7.2 7.8  ALBUMIN 4.0 4.0 4.6  AST 18 21 19   ALT 34 32 29  ALKPHOS 60 65 60  BILITOT 0.6 0.4 0.6     No results found.  Acquired  iron deficiency anemia due to decreased absorption #Iron deficiency anemia secondary to gastric bypass/malabsorption-July 2020 hemoglobin-11.5 however, ferritin 4.; JAN 2022-Hb-13.9; Iron studies/pending.  Hold IV iron infusion today/see below  #Fatigue/question etiology-question B12 deficiency/malabsorption patient unable to refill; will start patient on B12 intramuscular injection q Monthly.   # Insomnia- ? Anxiety; has tried melatonin in past- defer to PCP.   DISPOSITION:  # HOLD venofer  #  Follow up 6 months- MD;possible venofer. Labs- CBC, CMP, iron studies with ferritin; B12 levels--Dr.B  All questions were answered. The patient knows to call the clinic with any problems, questions or concerns.    Cammie Sickle, MD 06/27/2020 2:04 PM

## 2020-10-15 ENCOUNTER — Other Ambulatory Visit: Payer: Self-pay | Admitting: Family Medicine

## 2020-10-15 DIAGNOSIS — G8929 Other chronic pain: Secondary | ICD-10-CM

## 2020-10-15 DIAGNOSIS — K219 Gastro-esophageal reflux disease without esophagitis: Secondary | ICD-10-CM

## 2020-10-15 NOTE — Telephone Encounter (Signed)
Pt overdue OV- Sent pt message to make appt via MyChart.  Lidocaine patch last refilled 09/08/19 #30 with 5 RF Pantoprazole last RF 03/12/20 #90 1 RF.  All are active meds and RF are due.

## 2020-10-16 NOTE — Telephone Encounter (Signed)
Pt has an appt on 10/18/20 

## 2020-10-16 NOTE — Telephone Encounter (Signed)
lvm informing that prescription has been sent to pharmacy 
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
## 2020-10-18 ENCOUNTER — Encounter: Payer: Self-pay | Admitting: Family Medicine

## 2020-10-18 ENCOUNTER — Ambulatory Visit: Payer: BC Managed Care – PPO | Admitting: Family Medicine

## 2020-10-18 ENCOUNTER — Other Ambulatory Visit: Payer: Self-pay | Admitting: Family Medicine

## 2020-10-18 ENCOUNTER — Other Ambulatory Visit: Payer: Self-pay

## 2020-10-18 VITALS — BP 132/78 | HR 84 | Temp 98.3°F | Resp 16 | Ht 70.0 in | Wt 183.0 lb

## 2020-10-18 DIAGNOSIS — M25561 Pain in right knee: Secondary | ICD-10-CM | POA: Diagnosis not present

## 2020-10-18 DIAGNOSIS — K219 Gastro-esophageal reflux disease without esophagitis: Secondary | ICD-10-CM | POA: Diagnosis not present

## 2020-10-18 DIAGNOSIS — E559 Vitamin D deficiency, unspecified: Secondary | ICD-10-CM

## 2020-10-18 DIAGNOSIS — Z9884 Bariatric surgery status: Secondary | ICD-10-CM

## 2020-10-18 DIAGNOSIS — E538 Deficiency of other specified B group vitamins: Secondary | ICD-10-CM

## 2020-10-18 DIAGNOSIS — F3132 Bipolar disorder, current episode depressed, moderate: Secondary | ICD-10-CM | POA: Diagnosis not present

## 2020-10-18 DIAGNOSIS — F4310 Post-traumatic stress disorder, unspecified: Secondary | ICD-10-CM

## 2020-10-18 DIAGNOSIS — G8929 Other chronic pain: Secondary | ICD-10-CM

## 2020-10-18 DIAGNOSIS — M5441 Lumbago with sciatica, right side: Secondary | ICD-10-CM

## 2020-10-18 MED ORDER — PANTOPRAZOLE SODIUM 40 MG PO TBEC: 1.0000 | DELAYED_RELEASE_TABLET | Freq: Every day | ORAL | 1 refills | Status: DC

## 2020-10-18 MED ORDER — CARIPRAZINE HCL 1.5 MG PO CAPS: 1.5000 mg | ORAL_CAPSULE | Freq: Every day | ORAL | 0 refills | Status: DC

## 2020-11-07 ENCOUNTER — Encounter: Payer: Self-pay | Admitting: Internal Medicine

## 2020-11-07 ENCOUNTER — Other Ambulatory Visit: Payer: Self-pay | Admitting: Family Medicine

## 2020-11-07 ENCOUNTER — Institutional Professional Consult (permissible substitution): Payer: BC Managed Care – PPO | Admitting: Plastic Surgery

## 2020-11-07 DIAGNOSIS — K219 Gastro-esophageal reflux disease without esophagitis: Secondary | ICD-10-CM

## 2020-11-10 ENCOUNTER — Ambulatory Visit: Payer: BC Managed Care – PPO | Admitting: Plastic Surgery

## 2020-11-10 ENCOUNTER — Other Ambulatory Visit: Payer: Self-pay

## 2020-11-10 ENCOUNTER — Encounter: Payer: Self-pay | Admitting: Plastic Surgery

## 2020-11-10 VITALS — BP 120/78 | HR 70 | Ht 70.0 in | Wt 182.0 lb

## 2020-11-10 DIAGNOSIS — M545 Low back pain, unspecified: Secondary | ICD-10-CM

## 2020-11-10 DIAGNOSIS — M793 Panniculitis, unspecified: Secondary | ICD-10-CM | POA: Insufficient documentation

## 2020-11-10 DIAGNOSIS — G8929 Other chronic pain: Secondary | ICD-10-CM

## 2020-11-10 DIAGNOSIS — Z9884 Bariatric surgery status: Secondary | ICD-10-CM

## 2020-11-10 DIAGNOSIS — Z8639 Personal history of other endocrine, nutritional and metabolic disease: Secondary | ICD-10-CM | POA: Diagnosis not present

## 2020-11-10 DIAGNOSIS — Z719 Counseling, unspecified: Secondary | ICD-10-CM

## 2020-11-10 NOTE — Progress Notes (Signed)
Patient ID: Kiara Le, female    DOB: 08/30/1975, 45 y.o.   MRN: [id number]   Chief Complaint  Patient presents with   consult    The patient is a 45 year old female here for a second opinion.  The patient underwent an abdominal panniculectomy July 11, 2020.  He had about 100 pound weight loss prior to surgery.  Cording to the note it does not appear that she had plication of her rectus fascia.  She did have repositioning of the umbilicus.  2 drains were placed intraoperatively and she was closed in layers which included 2 PDS and 3 Monocryl.  The skin was closed with 5-0 Monocryl.  Over the past few months the patient has noticed a little bit of a bulging on the right side of her abdomen lateral to the umbilicus.  She also does not like the pulling sensation she feels at the lateral incision area.  It has a inferior trajectory instead of moving with the curves of her body.  I did explain that this was likely related to where the starting point was and would have been very difficult to predict postoperatively.  She is concerned that it collects sweat and water and it gets irritated quite a bit.  She is 5 feet 10 inches tall and currently weighs 182 pounds.  She is trying to lose more weight.  Past surgical histories include hysterectomy, tubal pregnancy and gastric bypass.  The bypass was 8 years ago.  I do not feel any hernia and her abdominal wall feels strong and tight.  Her past medical history is included below.  She has some hypertrophy of her scars and has been trying to use silicone tape.  This seems to irritate her skin after a couple of days.   Review of Systems  Constitutional: Negative.   HENT: Negative.    Eyes: Negative.   Respiratory: Negative.  Negative for chest tightness.   Cardiovascular:  Negative for leg swelling.  Gastrointestinal:  Negative for abdominal distention.  Endocrine: Negative.   Genitourinary: Negative.   Musculoskeletal: Negative.   Skin: Negative.    Hematological: Negative.   Psychiatric/Behavioral: Negative.     Past Medical History:  Diagnosis Date   Anxiety    Asthma    Bipolar disorder ([place])    Chronic fatigue    Chronic low back pain    Diabetes mellitus without complication (HCC)    Fatty liver disease, nonalcoholic    History of Helicobacter pylori infection    Hyperlipidemia    Hypertension    Insomnia    Morbid obesity (HCC)    Panic disorder    PTSD (post-traumatic stress disorder)    Vitamin B12 deficiency    Vitamin D deficiency     Past Surgical History:  Procedure Laterality Date   ABDOMINAL HYSTERECTOMY     GASTROPLASTY DUODENAL SWITCH     PARTIAL HYSTERECTOMY     SALPINGECTOMY     TUBAL LIGATION        Current Outpatient Medications:    cyanocobalamin (,VITAMIN B-12,) 1000 MCG/ML injection, Inject as directed See admin instructions., Disp: , Rfl:    cyclobenzaprine (FLEXERIL) 10 MG tablet, Take 0.5-1 tablets (5-10 mg total) by mouth 3 (three) times daily as needed for muscle spasms., Disp: 90 tablet, Rfl: 1   lidocaine (LIDODERM) 5 %, PLACE 1 PATCH ONTO THE SKIN DAILY. REMOVE & DISCARD PATCH WITHIN 12 HOURS OR AS DIRECTED BY MD, Disp: 30 patch, Rfl: 0  pantoprazole (PROTONIX) 40 MG tablet, TAKE 1 TABLET BY MOUTH EVERY DAY, Disp: 30 tablet, Rfl: 0   Syringe/Needle, Disp, (SYRINGE 3CC/23GX1") 23G X 1" 3 ML MISC, 1 Syringe by Does not apply route every 30 (thirty) days for 12 doses., Disp: 12 each, Rfl: 0   cariprazine (VRAYLAR) 1.5 MG capsule, Take 1 capsule (1.5 mg total) by mouth daily., Disp: 30 capsule, Rfl: 0   Objective:   Vitals:   11/10/20 0925  BP: 120/78  Pulse: 70  SpO2: 97%    Physical Exam Vitals and nursing note reviewed.  Constitutional:      Appearance: Normal appearance.  HENT:     Head: Normocephalic and atraumatic.  Cardiovascular:     Rate and Rhythm: Normal rate.     Pulses: Normal pulses.  Pulmonary:     Effort: Pulmonary effort is normal.  Abdominal:      General: Abdomen is flat.  Musculoskeletal:        General: No swelling or deformity.  Skin:    General: Skin is warm.     Capillary Refill: Capillary refill takes less than 2 seconds.     Coloration: Skin is not jaundiced.     Findings: No bruising, erythema or rash.  Neurological:     General: No focal deficit present.     Mental Status: She is alert.    Assessment & Plan:  Bariatric surgery status  History of diabetes mellitus, type II  Chronic right-sided low back pain without sciatica  Panniculitis  We discussed some options for correction.  Overall I think that she had remarkable improvement from her preop.  I think some of the scar issues could be improved upon in time.  I would recommend that she continue with massage and scar cream.  I would recommend she wait at least 6 months after the surgery before doing a revision.  It would also be helpful if she could lose about 5 pounds.  This would improve the ability to revise the scar in the lateral aspect.  She is going to think about her options and probably talk with Dr. Harl Bowie again.  We remain available as needed. Pictures were obtained of the patient and placed in the chart with the patient's or guardian's permission.   [place], DO

## 2020-11-13 ENCOUNTER — Emergency Department
Admission: EM | Admit: 2020-11-13 | Discharge: 2020-11-13 | Disposition: A | Discharge status: Home / Self Care | Payer: BC Managed Care – PPO | Attending: Emergency Medicine | Admitting: Emergency Medicine

## 2020-11-13 ENCOUNTER — Encounter: Payer: Self-pay | Admitting: Emergency Medicine

## 2020-11-13 ENCOUNTER — Other Ambulatory Visit: Payer: Self-pay

## 2020-11-13 DIAGNOSIS — E1169 Type 2 diabetes mellitus with other specified complication: Secondary | ICD-10-CM | POA: Diagnosis not present

## 2020-11-13 DIAGNOSIS — J45909 Unspecified asthma, uncomplicated: Secondary | ICD-10-CM | POA: Diagnosis not present

## 2020-11-13 DIAGNOSIS — E860 Dehydration: Secondary | ICD-10-CM | POA: Diagnosis not present

## 2020-11-13 DIAGNOSIS — I10 Essential (primary) hypertension: Secondary | ICD-10-CM | POA: Insufficient documentation

## 2020-11-13 DIAGNOSIS — R531 Weakness: Secondary | ICD-10-CM | POA: Insufficient documentation

## 2020-11-13 DIAGNOSIS — R5383 Other fatigue: Secondary | ICD-10-CM | POA: Diagnosis not present

## 2020-11-13 DIAGNOSIS — E785 Hyperlipidemia, unspecified: Secondary | ICD-10-CM | POA: Insufficient documentation

## 2020-11-13 LAB — CBC
HCT: 38.3 % (ref 36.0–46.0)
Hemoglobin: 12.9 g/dL (ref 12.0–15.0)
MCH: 31.9 pg (ref 26.0–34.0)
MCHC: 33.7 g/dL (ref 30.0–36.0)
MCV: 94.8 fL (ref 80.0–100.0)
Platelets: 234 10*3/uL (ref 150–400)
RBC: 4.04 MIL/uL (ref 3.87–5.11)
RDW: 11.9 % (ref 11.5–15.5)
WBC: 7.8 10*3/uL (ref 4.0–10.5)
nRBC: 0 % (ref 0.0–0.2)

## 2020-11-13 LAB — URINALYSIS, COMPLETE (UACMP) WITH MICROSCOPIC
Bacteria, UA: NONE SEEN
Bilirubin Urine: NEGATIVE
Glucose, UA: NEGATIVE mg/dL
Hgb urine dipstick: NEGATIVE
Ketones, ur: NEGATIVE mg/dL
Leukocytes,Ua: NEGATIVE
Nitrite: NEGATIVE
Protein, ur: NEGATIVE mg/dL
Specific Gravity, Urine: 1.006 (ref 1.005–1.030)
pH: 5 (ref 5.0–8.0)

## 2020-11-13 LAB — PREGNANCY, URINE: Preg Test, Ur: NEGATIVE

## 2020-11-13 LAB — BASIC METABOLIC PANEL
Anion gap: 6 (ref 5–15)
BUN: 13 mg/dL (ref 6–20)
CO2: 25 mmol/L (ref 22–32)
Calcium: 8.7 mg/dL — ABNORMAL LOW (ref 8.9–10.3)
Chloride: 110 mmol/L (ref 98–111)
Creatinine, Ser: 0.49 mg/dL (ref 0.44–1.00)
GFR, Estimated: >60 mL/min (ref >60)
Glucose, Bld: 108 mg/dL — ABNORMAL HIGH (ref 70–99)
Potassium: 3.5 mmol/L (ref 3.5–5.1)
Sodium: 141 mmol/L (ref 135–145)

## 2020-11-13 LAB — CBG MONITORING, ED: Glucose-Capillary: 109 mg/dL — ABNORMAL HIGH (ref 70–99)

## 2020-11-13 MED ORDER — LACTATED RINGERS IV BOLUS
1000.0000 mL | Freq: Once | INTRAVENOUS | Status: AC
  Administered 2020-11-13: 1000 mL via INTRAVENOUS

## 2020-11-13 NOTE — ED Notes (Signed)
EDP at bedside talking to pt and husband.

## 2020-11-13 NOTE — Discharge Instructions (Addendum)
Drink plenty of fluids over the next several days  Avoid alcohol or other drugs

## 2020-11-13 NOTE — ED Triage Notes (Signed)
C/O general weakness and confusion today.  States felt 'not well' yesterday and had an episode when she blacked out.  Arrives today tearful.  AAOx3.  Skin warm and dry. MAE equally and strong.  NAD
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
## 2020-11-13 NOTE — ED Notes (Signed)
CBG 109.  Husband at bedside. Pt teary, stating she has felt "very weak and confused" since yesterday. Yesterday pt was at [place] (in shade) with husband and family. Pt smoked small amount of "oil of marijuana" (hasish) from "a pen" and after that, pt felt very weak and "like everything went black". Pt did not faint or lose consciousness. Pt states she still feels "weak and confused".

## 2020-11-21 ENCOUNTER — Ambulatory Visit: Payer: BC Managed Care – PPO | Admitting: Family Medicine

## 2020-12-26 ENCOUNTER — Encounter: Payer: Self-pay | Admitting: Nurse Practitioner

## 2020-12-26 ENCOUNTER — Inpatient Hospital Stay: Payer: BC Managed Care – PPO

## 2020-12-26 ENCOUNTER — Inpatient Hospital Stay: Payer: BC Managed Care – PPO | Attending: Nurse Practitioner

## 2020-12-26 ENCOUNTER — Inpatient Hospital Stay (HOSPITAL_BASED_OUTPATIENT_CLINIC_OR_DEPARTMENT_OTHER): Payer: BC Managed Care – PPO | Admitting: Nurse Practitioner

## 2020-12-26 VITALS — BP 127/75 | HR 72 | Temp 97.5°F | Resp 17 | Wt 185.0 lb

## 2020-12-26 DIAGNOSIS — Z8616 Personal history of COVID-19: Secondary | ICD-10-CM | POA: Diagnosis not present

## 2020-12-26 DIAGNOSIS — E538 Deficiency of other specified B group vitamins: Secondary | ICD-10-CM | POA: Insufficient documentation

## 2020-12-26 DIAGNOSIS — E669 Obesity, unspecified: Secondary | ICD-10-CM | POA: Insufficient documentation

## 2020-12-26 DIAGNOSIS — E559 Vitamin D deficiency, unspecified: Secondary | ICD-10-CM | POA: Diagnosis not present

## 2020-12-26 DIAGNOSIS — I10 Essential (primary) hypertension: Secondary | ICD-10-CM | POA: Insufficient documentation

## 2020-12-26 DIAGNOSIS — Z79899 Other long term (current) drug therapy: Secondary | ICD-10-CM | POA: Diagnosis not present

## 2020-12-26 DIAGNOSIS — Z9884 Bariatric surgery status: Secondary | ICD-10-CM | POA: Diagnosis not present

## 2020-12-26 DIAGNOSIS — D519 Vitamin B12 deficiency anemia, unspecified: Secondary | ICD-10-CM

## 2020-12-26 DIAGNOSIS — D508 Other iron deficiency anemias: Secondary | ICD-10-CM | POA: Diagnosis not present

## 2020-12-26 DIAGNOSIS — D509 Iron deficiency anemia, unspecified: Secondary | ICD-10-CM | POA: Insufficient documentation

## 2020-12-26 DIAGNOSIS — G4700 Insomnia, unspecified: Secondary | ICD-10-CM | POA: Diagnosis not present

## 2020-12-26 DIAGNOSIS — E785 Hyperlipidemia, unspecified: Secondary | ICD-10-CM | POA: Insufficient documentation

## 2020-12-26 DIAGNOSIS — R5383 Other fatigue: Secondary | ICD-10-CM | POA: Insufficient documentation

## 2020-12-26 DIAGNOSIS — E119 Type 2 diabetes mellitus without complications: Secondary | ICD-10-CM | POA: Insufficient documentation

## 2020-12-26 DIAGNOSIS — F419 Anxiety disorder, unspecified: Secondary | ICD-10-CM | POA: Insufficient documentation

## 2020-12-26 DIAGNOSIS — K760 Fatty (change of) liver, not elsewhere classified: Secondary | ICD-10-CM | POA: Diagnosis not present

## 2020-12-26 LAB — COMPREHENSIVE METABOLIC PANEL
ALT: 32 U/L (ref 0–44)
AST: 24 U/L (ref 15–41)
Albumin: 4.3 g/dL (ref 3.5–5.0)
Alkaline Phosphatase: 68 U/L (ref 38–126)
Anion gap: 9 (ref 5–15)
BUN: 16 mg/dL (ref 6–20)
CO2: 24 mmol/L (ref 22–32)
Calcium: 8.4 mg/dL — ABNORMAL LOW (ref 8.9–10.3)
Chloride: 102 mmol/L (ref 98–111)
Creatinine, Ser: 0.65 mg/dL (ref 0.44–1.00)
GFR, Estimated: >60 mL/min (ref >60)
Glucose, Bld: 105 mg/dL — ABNORMAL HIGH (ref 70–99)
Potassium: 3.8 mmol/L (ref 3.5–5.1)
Sodium: 135 mmol/L (ref 135–145)
Total Bilirubin: 0.7 mg/dL (ref 0.3–1.2)
Total Protein: 7.1 g/dL (ref 6.5–8.1)

## 2020-12-26 LAB — IRON AND TIBC
Iron: 50 ug/dL (ref 28–170)
Saturation Ratios: 14 % (ref 10.4–31.8)
TIBC: 354 ug/dL (ref 250–450)
UIBC: 304 ug/dL

## 2020-12-26 LAB — CBC WITH DIFFERENTIAL/PLATELET
Abs Immature Granulocytes: 0.01 10*3/uL (ref 0.00–0.07)
Basophils Absolute: 0 10*3/uL (ref 0.0–0.1)
Basophils Relative: 1 %
Eosinophils Absolute: 0.1 10*3/uL (ref 0.0–0.5)
Eosinophils Relative: 2 %
HCT: 39.9 % (ref 36.0–46.0)
Hemoglobin: 13.5 g/dL (ref 12.0–15.0)
Immature Granulocytes: 0 %
Lymphocytes Relative: 30 %
Lymphs Abs: 1.9 10*3/uL (ref 0.7–4.0)
MCH: 32.2 pg (ref 26.0–34.0)
MCHC: 33.8 g/dL (ref 30.0–36.0)
MCV: 95.2 fL (ref 80.0–100.0)
Monocytes Absolute: 0.3 10*3/uL (ref 0.1–1.0)
Monocytes Relative: 5 %
Neutro Abs: 3.9 10*3/uL (ref 1.7–7.7)
Neutrophils Relative %: 62 %
Platelets: 246 10*3/uL (ref 150–400)
RBC: 4.19 MIL/uL (ref 3.87–5.11)
RDW: 11.9 % (ref 11.5–15.5)
WBC: 6.2 10*3/uL (ref 4.0–10.5)
nRBC: 0 % (ref 0.0–0.2)

## 2020-12-26 LAB — FERRITIN: Ferritin: 59 ng/mL (ref 11–307)

## 2020-12-26 LAB — VITAMIN B12: Vitamin B-12: 1103 pg/mL — ABNORMAL HIGH (ref 180–914)

## 2020-12-26 NOTE — Progress Notes (Signed)
[facility] CONSULT NOTE  Patient Care Team: Steele Sizer, MD as PCP - General (Family Medicine)  CHIEF COMPLAINTS/PURPOSE OF CONSULTATION: Anemia iron deficiency   HEMATOLOGY HISTORY  #Iron deficiency/gastric bypass-prior history of iron IV infusions [[facility]]; intolerance to p.o. iron.  # B12 deficiency- on B12 injection/ monthly [ script sent]  # DUODENAL SWITCH [~ 125 pounds weight loss/ Oct 2014; [facility] ]   HISTORY OF PRESENTING ILLNESS: Kiara Le 45 y.o. female returns to clinic for labs, further evaluation, and consideration of venofer for history of anemia secodnary to malabsorption post gastric surgery. She continues monthly b12 injections. Persistent fatigue, brain fog. Denies any neurologic complaints. Denies recent fevers or illnesses. Denies any easy bleeding or bruising. No melena or hematochezia. No pica or restless leg. Reports good appetite and denies weight loss. Denies chest pain. Denies any nausea, vomiting, constipation, or diarrhea. Denies urinary complaints. Patient offers no further specific complaints today.   Review of Systems  Constitutional:  Positive for malaise/fatigue. Negative for chills, diaphoresis, fever and weight loss.  HENT:  Negative for nosebleeds and sore throat.   Eyes:  Negative for double vision.  Respiratory:  Negative for cough, hemoptysis, sputum production, shortness of breath and wheezing.   Cardiovascular:  Negative for chest pain, palpitations, orthopnea and leg swelling.  Gastrointestinal:  Negative for abdominal pain, blood in stool, constipation, diarrhea, heartburn, melena, nausea and vomiting.  Genitourinary:  Negative for dysuria, frequency and urgency.  Musculoskeletal:  Negative for back pain and joint pain.  Skin: Negative.  Negative for itching and rash.  Neurological:  Negative for dizziness, tingling, focal weakness, weakness and headaches.  Endo/Heme/Allergies:  Does not bruise/bleed easily.   Psychiatric/Behavioral:  Negative for depression. The patient is not nervous/anxious and does not have insomnia.    MEDICAL HISTORY:  Past Medical History:  Diagnosis Date   Anxiety    Asthma    Bipolar disorder (HCC)    Chronic fatigue    Chronic low back pain    Diabetes mellitus without complication (HCC)    Fatty liver disease, nonalcoholic    History of Helicobacter pylori infection    Hyperlipidemia    Hypertension    Insomnia    Morbid obesity (HCC)    Panic disorder    PTSD (post-traumatic stress disorder)    Vitamin B12 deficiency    Vitamin D deficiency     SURGICAL HISTORY: Past Surgical History:  Procedure Laterality Date   ABDOMINAL HYSTERECTOMY     GASTROPLASTY DUODENAL SWITCH     PARTIAL HYSTERECTOMY     SALPINGECTOMY     TUBAL LIGATION      SOCIAL HISTORY: Social History   Socioeconomic History   Marital status: Married    Spouse name: Michiel Cowboy   Number of children: 2   Years of education: Not on file   Highest education level: Not on file  Occupational History    Comment: lost job because of COVID-19   Tobacco Use   Smoking status: Never   Smokeless tobacco: Never  Vaping Use   Vaping Use: Never used  Substance and Sexual Activity   Alcohol use: No    Alcohol/week: 0.0 standard drinks   Drug use: No   Sexual activity: Yes    Partners: Male    Birth control/protection: None  Other Topics Concern   Not on file  Social History Narrative   Married; Lost job because of COVID-19; non-smoker; ocassional alcohol. [employer]   Social Determinants of Health  Financial Resource Strain: Not on file  Food Insecurity: Not on file  Transportation Needs: Not on file  Physical Activity: Not on file  Stress: Not on file  Social Connections: Not on file  Intimate Partner Violence: Not on file    FAMILY HISTORY: Family History  Problem Relation Age of Onset   Heart disease Father    Hypertension Father    Hyperlipidemia Father    Drug abuse  Mother    Anxiety disorder Sister    Depression Sister    Breast cancer Neg Hx     ALLERGIES:  is allergic to other; 2,4-d dimethylamine (amisol); bee venom; gabapentin; nsaids; tetanus toxoid adsorbed; and tetanus toxoids.  MEDICATIONS:  Current Outpatient Medications  Medication Sig Dispense Refill   Biotin 10 MG TABS Take by mouth.     calcium carbonate (OS-CAL) 1250 (500 Ca) MG chewable tablet Chew 1 tablet by mouth daily.     cyanocobalamin (,VITAMIN B-12,) 1000 MCG/ML injection Inject as directed See admin instructions.     cyclobenzaprine (FLEXERIL) 10 MG tablet Take 0.5-1 tablets (5-10 mg total) by mouth 3 (three) times daily as needed for muscle spasms. 90 tablet 1   lidocaine (LIDODERM) 5 % PLACE 1 PATCH ONTO THE SKIN DAILY. REMOVE & DISCARD PATCH WITHIN 12 HOURS OR AS DIRECTED BY MD 30 patch 0   Multiple Vitamin (MULTIVITAMIN ADULT PO) Take by mouth. hiadek     pantoprazole (PROTONIX) 40 MG tablet TAKE 1 TABLET BY MOUTH EVERY DAY 30 tablet 0   VITAMIN A PO Take by mouth.     cariprazine (VRAYLAR) 1.5 MG capsule Take 1 capsule (1.5 mg total) by mouth daily. (Patient not taking: Reported on 12/26/2020) 30 capsule 0   No current facility-administered medications for this visit.    PHYSICAL EXAMINATION: Vitals:   12/26/20 1322  BP: 127/75  Pulse: 72  Resp: 17  Temp: (!) 97.5 F (36.4 C)  SpO2: 100%   Filed Weights   12/26/20 1322  Weight: 185 lb (83.9 kg)    Physical Exam HENT:     Head: Normocephalic and atraumatic.     Mouth/Throat:     Pharynx: No oropharyngeal exudate.  Eyes:     Pupils: Pupils are equal, round, and reactive to light.  Cardiovascular:     Rate and Rhythm: Normal rate and regular rhythm.  Pulmonary:     Effort: No respiratory distress.     Breath sounds: No wheezing.  Abdominal:     General: Bowel sounds are normal. There is no distension.     Palpations: Abdomen is soft. There is no mass.     Tenderness: There is no abdominal  tenderness. There is no guarding or rebound.  Musculoskeletal:        General: No tenderness. Normal range of motion.     Cervical back: Normal range of motion and neck supple.  Skin:    General: Skin is warm.  Neurological:     Mental Status: She is alert and oriented to person, place, and time.  Psychiatric:        Mood and Affect: Affect normal.    LABORATORY DATA:  I have reviewed the data as listed Lab Results  Component Value Date   WBC 6.2 12/26/2020   HGB 13.5 12/26/2020   HCT 39.9 12/26/2020   MCV 95.2 12/26/2020   PLT 246 12/26/2020   Recent Labs    06/27/20 1307 11/13/20 0714 12/26/20 1220  NA 138 141 135  K 4.4  3.5 3.8  CL 105 110 102  CO2 '25 25 24  '$ GLUCOSE 87 108* 105*  BUN '15 13 16  '$ CREATININE 0.64 0.49 0.65  CALCIUM 8.7* 8.7* 8.4*  GFRNONAA >60 >60 >60  PROT 7.1  --  7.1  ALBUMIN 4.0  --  4.3  AST 20  --  24  ALT 26  --  32  ALKPHOS 58  --  68  BILITOT 0.5  --  0.7     No results found.  ASSESSMENT AND PLAN  Iron deficiency anemia- secondary to gastric bypass/malabsorption. July 2020 hemoglobin 11.5, ferritin 4. Today, hmg 13.5, no microcytosis. Iron studies pending. Hold venofer today. B12 deficiency- Currently on b12 IM monthly. Previously, results > 7500. Can adjust administration to every other month.  Anxiety- mixed depressive and anxiety symptoms. Managed by pcp. Not currently on medication due to cost. Encouraged her to follow up with her pcp.     DISPOSITION: No venofer today Every other month b12 injection RTC in 6 months for labs (cbc, iron studies, ferritin, b12), Dr. Jacinto Reap, possible venofer  No problem-specific Assessment & Plan notes found for this encounter.  All questions were answered. The patient knows to call the clinic with any problems, questions or concerns.    Verlon Au, NP 12/26/2020 2:08 PM

## 2020-12-26 NOTE — Progress Notes (Signed)
Patient here for oncology follow-up appointment, concerns of fatigue/ no energy

## 2020-12-27 ENCOUNTER — Encounter: Payer: Self-pay | Admitting: Family Medicine

## 2020-12-28 NOTE — Telephone Encounter (Signed)
I have not received a PA request.

## 2021-01-08 ENCOUNTER — Other Ambulatory Visit: Payer: Self-pay | Admitting: Internal Medicine

## 2021-01-17 ENCOUNTER — Encounter: Payer: Self-pay | Admitting: Internal Medicine

## 2021-01-18 ENCOUNTER — Other Ambulatory Visit: Payer: Self-pay | Admitting: Internal Medicine

## 2021-01-23 ENCOUNTER — Inpatient Hospital Stay: Payer: BC Managed Care – PPO | Attending: Internal Medicine

## 2021-02-10 ENCOUNTER — Other Ambulatory Visit: Payer: Self-pay | Admitting: Family Medicine

## 2021-02-10 DIAGNOSIS — K219 Gastro-esophageal reflux disease without esophagitis: Secondary | ICD-10-CM

## 2021-02-10 NOTE — Telephone Encounter (Signed)
Requested Prescriptions  Pending Prescriptions Disp Refills  . pantoprazole (PROTONIX) 40 MG tablet [Pharmacy Med Name: PANTOPRAZOLE SOD DR 40 MG TAB] 30 tablet 0    Sig: TAKE 1 TABLET BY MOUTH EVERY DAY     Gastroenterology: Proton Pump Inhibitors Passed - 02/10/2021  9:31 AM      Passed - Valid encounter within last 12 months    Recent Outpatient Visits          3 months ago Bipolar affective disorder, currently depressed, moderate ([facility])   [facility] Steele Sizer, MD   1 year ago Vitamin D deficiency   [facility] [place], Drue Stager, MD   2 years ago Bipolar affective disorder, currently depressed, moderate [facility])   [facility] Steele Sizer, MD   2 years ago Thrombocytosis [facility])   [facility] Steele Sizer, MD   2 years ago Panic attack   [facility] Steele Sizer, MD

## 2021-02-21 ENCOUNTER — Other Ambulatory Visit: Payer: Self-pay | Admitting: Family Medicine

## 2021-02-21 DIAGNOSIS — Z1231 Encounter for screening mammogram for malignant neoplasm of breast: Secondary | ICD-10-CM

## 2021-02-27 ENCOUNTER — Other Ambulatory Visit: Payer: Self-pay | Admitting: *Deleted

## 2021-02-27 ENCOUNTER — Encounter: Payer: Self-pay | Admitting: Internal Medicine

## 2021-02-27 ENCOUNTER — Inpatient Hospital Stay: Payer: BC Managed Care – PPO | Attending: Internal Medicine

## 2021-02-27 DIAGNOSIS — Z79899 Other long term (current) drug therapy: Secondary | ICD-10-CM | POA: Diagnosis not present

## 2021-02-27 DIAGNOSIS — E538 Deficiency of other specified B group vitamins: Secondary | ICD-10-CM | POA: Diagnosis present

## 2021-02-27 DIAGNOSIS — D508 Other iron deficiency anemias: Secondary | ICD-10-CM

## 2021-02-27 MED ORDER — CYANOCOBALAMIN 1000 MCG/ML IJ SOLN: 1000.0000 ug | INTRAMUSCULAR | 12 refills | Status: DC

## 2021-02-27 MED ORDER — "SYRINGE/NEEDLE (DISP) 22G X 1"" 3 ML MISC": 1.0000 | 0 refills | Status: AC

## 2021-02-27 MED ORDER — CYANOCOBALAMIN 1000 MCG/ML IJ SOLN
1000.0000 ug | Freq: Once | INTRAMUSCULAR | Status: AC
  Administered 2021-02-27: 1000 ug via INTRAMUSCULAR
  Filled 2021-02-27: qty 1

## 2021-03-14 ENCOUNTER — Ambulatory Visit
Admission: RE | Admit: 2021-03-14 | Discharge: 2021-03-14 | Disposition: A | Discharge status: Home / Self Care | Payer: BC Managed Care – PPO | Source: Ambulatory Visit | Attending: Family Medicine | Admitting: Family Medicine

## 2021-03-14 ENCOUNTER — Other Ambulatory Visit: Payer: Self-pay

## 2021-03-14 DIAGNOSIS — Z1231 Encounter for screening mammogram for malignant neoplasm of breast: Secondary | ICD-10-CM | POA: Insufficient documentation

## 2021-03-22 ENCOUNTER — Encounter: Payer: Self-pay | Admitting: Family Medicine

## 2021-03-22 ENCOUNTER — Other Ambulatory Visit: Payer: Self-pay | Admitting: Family Medicine

## 2021-03-22 ENCOUNTER — Telehealth (INDEPENDENT_AMBULATORY_CARE_PROVIDER_SITE_OTHER): Payer: BC Managed Care – PPO | Admitting: Family Medicine

## 2021-03-22 DIAGNOSIS — F4321 Adjustment disorder with depressed mood: Secondary | ICD-10-CM

## 2021-03-22 DIAGNOSIS — F5102 Adjustment insomnia: Secondary | ICD-10-CM

## 2021-03-22 DIAGNOSIS — F3132 Bipolar disorder, current episode depressed, moderate: Secondary | ICD-10-CM

## 2021-03-22 MED ORDER — CARIPRAZINE HCL 1.5 MG PO CAPS: 1.5000 mg | ORAL_CAPSULE | Freq: Every day | ORAL | 0 refills | Status: DC

## 2021-03-22 MED ORDER — HYDROXYZINE HCL 10 MG PO TABS: 10.0000 mg | ORAL_TABLET | Freq: Every evening | ORAL | 0 refills | Status: DC | PRN
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
## 2021-03-27 ENCOUNTER — Telehealth: Payer: BC Managed Care – PPO | Admitting: Family Medicine

## 2021-03-27 ENCOUNTER — Inpatient Hospital Stay: Payer: BC Managed Care – PPO

## 2021-03-29 ENCOUNTER — Other Ambulatory Visit: Payer: Self-pay | Admitting: Family Medicine

## 2021-03-29 DIAGNOSIS — F5102 Adjustment insomnia: Secondary | ICD-10-CM

## 2021-04-18 ENCOUNTER — Other Ambulatory Visit: Payer: Self-pay | Admitting: Family Medicine

## 2021-04-18 DIAGNOSIS — F3132 Bipolar disorder, current episode depressed, moderate: Secondary | ICD-10-CM

## 2021-04-18 NOTE — Telephone Encounter (Signed)
Lvm for pt to call and schedule an appt  

## 2021-04-18 NOTE — Telephone Encounter (Signed)
Requested medications are due for refill today.  yes  Requested medications are on the active medications list.  yes  Last refill. 03/22/2021  Future visit scheduled.   no  Notes to clinic.  Medication not delegated.

## 2021-04-24 ENCOUNTER — Inpatient Hospital Stay: Payer: BC Managed Care – PPO

## 2021-05-07 ENCOUNTER — Telehealth: Payer: Self-pay | Admitting: Internal Medicine

## 2021-05-07 NOTE — Telephone Encounter (Signed)
Pt called in to see if she could push her appt up. She is having some issues. Please give her a call back at [phone]

## 2021-05-07 NOTE — Telephone Encounter (Signed)
Done

## 2021-05-07 NOTE — Telephone Encounter (Signed)
She complains of cramping in her feet and legs and dizziness. Feels "off". Has been very tired for past week and the cramps and dizziness started Friday. Reports that she is eating and drinking normally. She has not had any changes in her medications or anything else new. Denies fever congestion or headaches.

## 2021-05-09 ENCOUNTER — Inpatient Hospital Stay: Payer: BC Managed Care – PPO | Attending: Nurse Practitioner

## 2021-05-09 ENCOUNTER — Other Ambulatory Visit: Payer: Self-pay

## 2021-05-09 DIAGNOSIS — D508 Other iron deficiency anemias: Secondary | ICD-10-CM

## 2021-05-09 DIAGNOSIS — D509 Iron deficiency anemia, unspecified: Secondary | ICD-10-CM | POA: Insufficient documentation

## 2021-05-09 DIAGNOSIS — D519 Vitamin B12 deficiency anemia, unspecified: Secondary | ICD-10-CM

## 2021-05-09 DIAGNOSIS — Z8639 Personal history of other endocrine, nutritional and metabolic disease: Secondary | ICD-10-CM | POA: Diagnosis not present

## 2021-05-09 LAB — CBC
HCT: 38.2 % (ref 36.0–46.0)
Hemoglobin: 12.7 g/dL (ref 12.0–15.0)
MCH: 32 pg (ref 26.0–34.0)
MCHC: 33.2 g/dL (ref 30.0–36.0)
MCV: 96.2 fL (ref 80.0–100.0)
Platelets: 219 10*3/uL (ref 150–400)
RBC: 3.97 MIL/uL (ref 3.87–5.11)
RDW: 11.8 % (ref 11.5–15.5)
WBC: 6.8 10*3/uL (ref 4.0–10.5)
nRBC: 0 % (ref 0.0–0.2)

## 2021-05-09 LAB — COMPREHENSIVE METABOLIC PANEL
ALT: 45 U/L — ABNORMAL HIGH (ref 0–44)
AST: 27 U/L (ref 15–41)
Albumin: 4.1 g/dL (ref 3.5–5.0)
Alkaline Phosphatase: 79 U/L (ref 38–126)
Anion gap: 10 (ref 5–15)
BUN: 17 mg/dL (ref 6–20)
CO2: 23 mmol/L (ref 22–32)
Calcium: 8.6 mg/dL — ABNORMAL LOW (ref 8.9–10.3)
Chloride: 105 mmol/L (ref 98–111)
Creatinine, Ser: 0.77 mg/dL (ref 0.44–1.00)
GFR, Estimated: >60 mL/min (ref >60)
Glucose, Bld: 98 mg/dL (ref 70–99)
Potassium: 3.8 mmol/L (ref 3.5–5.1)
Sodium: 138 mmol/L (ref 135–145)
Total Bilirubin: 0.6 mg/dL (ref 0.3–1.2)
Total Protein: 7 g/dL (ref 6.5–8.1)

## 2021-05-09 LAB — VITAMIN B12: Vitamin B-12: 1341 pg/mL — ABNORMAL HIGH (ref 180–914)

## 2021-05-09 LAB — FERRITIN: Ferritin: 45 ng/mL (ref 11–307)

## 2021-05-09 LAB — IRON AND TIBC
Iron: 60 ug/dL (ref 28–170)
Saturation Ratios: 19 % (ref 10.4–31.8)
TIBC: 321 ug/dL (ref 250–450)
UIBC: 261 ug/dL

## 2021-05-10 ENCOUNTER — Encounter: Payer: Self-pay | Admitting: Nurse Practitioner

## 2021-05-10 ENCOUNTER — Inpatient Hospital Stay: Payer: BC Managed Care – PPO

## 2021-05-10 ENCOUNTER — Inpatient Hospital Stay: Payer: BC Managed Care – PPO | Admitting: Nurse Practitioner

## 2021-05-14 ENCOUNTER — Other Ambulatory Visit: Payer: Self-pay | Admitting: Family Medicine

## 2021-05-14 DIAGNOSIS — F3132 Bipolar disorder, current episode depressed, moderate: Secondary | ICD-10-CM

## 2021-05-16 NOTE — Telephone Encounter (Signed)
Lvm per dr Needs follow up, sending 30 days

## 2021-05-29 ENCOUNTER — Inpatient Hospital Stay: Payer: BC Managed Care – PPO

## 2021-06-29 ENCOUNTER — Ambulatory Visit: Payer: BC Managed Care – PPO | Admitting: Internal Medicine

## 2021-06-29 ENCOUNTER — Other Ambulatory Visit: Payer: BC Managed Care – PPO

## 2021-06-29 ENCOUNTER — Ambulatory Visit: Payer: BC Managed Care – PPO

## 2021-08-01 ENCOUNTER — Other Ambulatory Visit: Payer: Self-pay | Admitting: *Deleted

## 2021-08-01 DIAGNOSIS — D508 Other iron deficiency anemias: Secondary | ICD-10-CM

## 2021-08-01 DIAGNOSIS — D519 Vitamin B12 deficiency anemia, unspecified: Secondary | ICD-10-CM

## 2021-08-06 ENCOUNTER — Inpatient Hospital Stay: Payer: BC Managed Care – PPO | Attending: Nurse Practitioner

## 2021-08-06 ENCOUNTER — Other Ambulatory Visit: Payer: Self-pay

## 2021-08-06 DIAGNOSIS — Z79899 Other long term (current) drug therapy: Secondary | ICD-10-CM | POA: Diagnosis not present

## 2021-08-06 DIAGNOSIS — D509 Iron deficiency anemia, unspecified: Secondary | ICD-10-CM | POA: Insufficient documentation

## 2021-08-06 DIAGNOSIS — D508 Other iron deficiency anemias: Secondary | ICD-10-CM

## 2021-08-06 DIAGNOSIS — E538 Deficiency of other specified B group vitamins: Secondary | ICD-10-CM | POA: Insufficient documentation

## 2021-08-06 DIAGNOSIS — D519 Vitamin B12 deficiency anemia, unspecified: Secondary | ICD-10-CM

## 2021-08-06 LAB — VITAMIN B12: Vitamin B-12: 1540 pg/mL — ABNORMAL HIGH (ref 180–914)

## 2021-08-06 LAB — CBC WITH DIFFERENTIAL/PLATELET
Abs Immature Granulocytes: 0.01 10*3/uL (ref 0.00–0.07)
Basophils Absolute: 0.1 10*3/uL (ref 0.0–0.1)
Basophils Relative: 1 %
Eosinophils Absolute: 0.1 10*3/uL (ref 0.0–0.5)
Eosinophils Relative: 1 %
HCT: 39.6 % (ref 36.0–46.0)
Hemoglobin: 12.9 g/dL (ref 12.0–15.0)
Immature Granulocytes: 0 %
Lymphocytes Relative: 26 %
Lymphs Abs: 1.8 10*3/uL (ref 0.7–4.0)
MCH: 31.3 pg (ref 26.0–34.0)
MCHC: 32.6 g/dL (ref 30.0–36.0)
MCV: 96.1 fL (ref 80.0–100.0)
Monocytes Absolute: 0.4 10*3/uL (ref 0.1–1.0)
Monocytes Relative: 5 %
Neutro Abs: 4.6 10*3/uL (ref 1.7–7.7)
Neutrophils Relative %: 67 %
Platelets: 225 10*3/uL (ref 150–400)
RBC: 4.12 MIL/uL (ref 3.87–5.11)
RDW: 12.6 % (ref 11.5–15.5)
WBC: 6.9 10*3/uL (ref 4.0–10.5)
nRBC: 0 % (ref 0.0–0.2)

## 2021-08-06 LAB — COMPREHENSIVE METABOLIC PANEL
ALT: 44 U/L (ref 0–44)
AST: 25 U/L (ref 15–41)
Albumin: 4 g/dL (ref 3.5–5.0)
Alkaline Phosphatase: 70 U/L (ref 38–126)
Anion gap: 5 (ref 5–15)
BUN: 15 mg/dL (ref 6–20)
CO2: 28 mmol/L (ref 22–32)
Calcium: 8.4 mg/dL — ABNORMAL LOW (ref 8.9–10.3)
Chloride: 104 mmol/L (ref 98–111)
Creatinine, Ser: 0.57 mg/dL (ref 0.44–1.00)
GFR, Estimated: >60 mL/min (ref >60)
Glucose, Bld: 89 mg/dL (ref 70–99)
Potassium: 3.9 mmol/L (ref 3.5–5.1)
Sodium: 137 mmol/L (ref 135–145)
Total Bilirubin: 0.6 mg/dL (ref 0.3–1.2)
Total Protein: 6.9 g/dL (ref 6.5–8.1)

## 2021-08-06 LAB — IRON AND TIBC
Iron: 58 ug/dL (ref 28–170)
Saturation Ratios: 17 % (ref 10.4–31.8)
TIBC: 347 ug/dL (ref 250–450)
UIBC: 289 ug/dL

## 2021-08-06 LAB — FERRITIN: Ferritin: 28 ng/mL (ref 11–307)

## 2021-08-09 ENCOUNTER — Inpatient Hospital Stay (HOSPITAL_BASED_OUTPATIENT_CLINIC_OR_DEPARTMENT_OTHER): Payer: BC Managed Care – PPO | Admitting: Nurse Practitioner

## 2021-08-09 ENCOUNTER — Other Ambulatory Visit: Payer: Self-pay

## 2021-08-09 ENCOUNTER — Encounter: Payer: Self-pay | Admitting: Nurse Practitioner

## 2021-08-16 ENCOUNTER — Inpatient Hospital Stay (HOSPITAL_BASED_OUTPATIENT_CLINIC_OR_DEPARTMENT_OTHER): Payer: BC Managed Care – PPO | Admitting: Nurse Practitioner

## 2021-08-16 ENCOUNTER — Encounter: Payer: Self-pay | Admitting: Nurse Practitioner

## 2021-08-16 DIAGNOSIS — D508 Other iron deficiency anemias: Secondary | ICD-10-CM

## 2021-08-16 DIAGNOSIS — D519 Vitamin B12 deficiency anemia, unspecified: Secondary | ICD-10-CM | POA: Diagnosis not present

## 2021-08-16 MED ORDER — CYANOCOBALAMIN 1000 MCG/ML IJ SOLN: 1000.0000 ug | INTRAMUSCULAR | 0 refills | Status: DC

## 2021-08-16 NOTE — Progress Notes (Signed)
Patient states no concerns at the moment. 

## 2021-08-16 NOTE — Progress Notes (Signed)
[place] ?CONSULT NOTE ? ?Virtual Visit Progress Note ? ?I connected with Kiara Le on 08/16/21 at  2:30 PM EDT by video enabled telemedicine visit and verified that I am speaking with the correct person using two identifiers.  ? ?I discussed the limitations, risks, security and privacy concerns of performing an evaluation and management service by telemedicine and the availability of in-person appointments. I also discussed with the patient that there may be a patient responsible charge related to this service. The patient expressed understanding and agreed to proceed.  ? ?Other persons participating in the visit and their role in the encounter: none  ? ?Patient?s location: work  ?Provider?s location: clinic  ? ?Patient Care Team: ?Steele Sizer, MD as PCP - General (Family Medicine) ? ?CHIEF COMPLAINTS/PURPOSE OF CONSULTATION: Anemia iron deficiency ? ?HEMATOLOGY HISTORY ? ?#Iron deficiency/gastric bypass-prior history of iron IV infusions [[facility]]; intolerance to p.o. iron. ? ?# B12 deficiency- on B12 injection/ monthly [ script sent] ? ?# DUODENAL SWITCH [~ 125 pounds weight loss/ Oct 2014; [facility] ]  ? ?HISTORY OF PRESENTING ILLNESS: Kiara Le 46 y.o. female who agrees to evaluation via telemedicine for history of anemia secondary to gastric surgery. She continues every other month b12 injections at home. Has some fatigue but generally feels well.  ? ?Review of Systems  ?Constitutional:  Positive for malaise/fatigue. Negative for chills, diaphoresis, fever and weight loss.  ?HENT:  Negative for nosebleeds and sore throat.   ?Eyes:  Negative for double vision.  ?Respiratory:  Negative for cough, hemoptysis, sputum production, shortness of breath and wheezing.   ?Cardiovascular:  Negative for chest pain, palpitations, orthopnea and leg swelling.  ?Gastrointestinal:  Negative for abdominal pain, blood in stool, constipation, diarrhea, heartburn, melena, nausea and vomiting.   ?Genitourinary:  Negative for dysuria, frequency and urgency.  ?Musculoskeletal:  Negative for back pain and joint pain.  ?Skin: Negative.  Negative for itching and rash.  ?Neurological:  Negative for dizziness, tingling, focal weakness, weakness and headaches.  ?Endo/Heme/Allergies:  Does not bruise/bleed easily.  ?Psychiatric/Behavioral:  Negative for depression. The patient is not nervous/anxious and does not have insomnia.   ? ?MEDICAL HISTORY:  ?Past Medical History:  ?Diagnosis Date  ? Anxiety   ? Asthma   ? Bipolar disorder ([place])   ? Chronic fatigue   ? Chronic low back pain   ? Diabetes mellitus without complication ([place])   ? Fatty liver disease, nonalcoholic   ? History of Helicobacter pylori infection   ? Hyperlipidemia   ? Hypertension   ? Insomnia   ? Morbid obesity ([place])   ? Panic disorder   ? PTSD (post-traumatic stress disorder)   ? Vitamin B12 deficiency   ? Vitamin D deficiency   ? ? ?SURGICAL HISTORY: ?Past Surgical History:  ?Procedure Laterality Date  ? ABDOMINAL HYSTERECTOMY    ? GASTROPLASTY DUODENAL SWITCH    ? PARTIAL HYSTERECTOMY    ? SALPINGECTOMY    ? TUBAL LIGATION    ? ? ?SOCIAL HISTORY: ?Social History  ? ?Socioeconomic History  ? Marital status: Married  ?  Spouse name: Michiel Cowboy  ? Number of children: 2  ? Years of education: Not on file  ? Highest education level: Not on file  ?Occupational History  ?  Comment: lost job because of COVID-19   ?Tobacco Use  ? Smoking status: Never  ? Smokeless tobacco: Never  ?Vaping Use  ? Vaping Use: Never used  ?Substance and Sexual Activity  ? Alcohol  use: No  ?  Alcohol/week: 0.0 standard drinks  ? Drug use: No  ? Sexual activity: Yes  ?  Partners: Male  ?  Birth control/protection: None  ?Other Topics Concern  ? Not on file  ?Social History Narrative  ? Married; Lost job because of COVID-19; non-smoker; ocassional alcohol. [place]  ? ?Social Determinants of Health  ? ?Financial Resource Strain: Not on file  ?Food Insecurity: Not on file   ?Transportation Needs: Not on file  ?Physical Activity: Not on file  ?Stress: Not on file  ?Social Connections: Not on file  ?Intimate Partner Violence: Not on file  ? ? ?FAMILY HISTORY: ?Family History  ?Problem Relation Age of Onset  ? Drug abuse Mother   ? Heart disease Father   ? Hypertension Father   ? Hyperlipidemia Father   ? Anxiety disorder Sister   ? Depression Sister   ? Dementia Paternal Grandmother   ? Breast cancer Neg Hx   ? ? ?ALLERGIES:  is allergic to other; 2,4-d dimethylamine; bee venom; gabapentin; nsaids; tetanus toxoid adsorbed; and tetanus toxoids. ? ?MEDICATIONS:  ?Current Outpatient Medications  ?Medication Sig Dispense Refill  ? Biotin 10 MG TABS Take by mouth.    ? calcium carbonate (OS-CAL) 1250 (500 Ca) MG chewable tablet Chew 1 tablet by mouth daily.    ? ciprofloxacin (CIPRO) 500 MG tablet SMARTSIG:1 Tablet(s) By Mouth Every 12 Hours    ? cyanocobalamin (,VITAMIN B-12,) 1000 MCG/ML injection Inject 1 mL (1,000 mcg total) into the muscle once for 1 dose. 1073mg/1ml Intramuscular once a month 30 mL 1  ? lidocaine (LIDODERM) 5 % PLACE 1 PATCH ONTO THE SKIN DAILY. REMOVE & DISCARD PATCH WITHIN 12 HOURS OR AS DIRECTED BY MD 30 patch 0  ? Multiple Vitamin (MULTIVITAMIN ADULT PO) Take by mouth. hiadek    ? pantoprazole (PROTONIX) 40 MG tablet TAKE 1 TABLET BY MOUTH EVERY DAY 90 tablet 2  ? VITAMIN A PO Take by mouth.    ? benzonatate (TESSALON) 200 MG capsule Take 200 mg by mouth 3 (three) times daily. (Patient not taking: Reported on 08/09/2021)    ? cariprazine (VRAYLAR) 1.5 MG capsule Take 1 capsule (1.5 mg total) by mouth daily. (Patient not taking: Reported on 08/09/2021) 30 capsule 0  ? cyclobenzaprine (FLEXERIL) 10 MG tablet Take 0.5-1 tablets (5-10 mg total) by mouth 3 (three) times daily as needed for muscle spasms. (Patient not taking: Reported on 08/09/2021) 90 tablet 1  ? hydrOXYzine (ATARAX/VISTARIL) 10 MG tablet Take 1-2 tablets (10-20 mg total) by mouth at bedtime as needed.  (Patient not taking: Reported on 08/09/2021) 30 tablet 0  ? ?No current facility-administered medications for this visit.  ? ? ?PHYSICAL EXAMINATION: ?There were no vitals filed for this visit. ? ?There were no vitals filed for this visit. ? ? ?Physical Exam ?Constitutional:   ?   Appearance: She is not ill-appearing.  ?Pulmonary:  ?   Effort: Pulmonary effort is normal.  ?Skin: ?   Coloration: Skin is not jaundiced or pale.  ?Neurological:  ?   Mental Status: She is alert and oriented to person, place, and time.  ?Psychiatric:     ?   Mood and Affect: Mood normal.     ?   Behavior: Behavior normal.  ? ? ?LABORATORY DATA:  ?I have reviewed the data as listed ?Lab Results  ?Component Value Date  ? WBC 6.9 08/06/2021  ? HGB 12.9 08/06/2021  ? HCT 39.6 08/06/2021  ? MCV 96.1  08/06/2021  ? PLT 225 08/06/2021  ? ?Recent Labs  ?  12/26/20 ?1220 05/09/21 ?1428 08/06/21 ?1459  ?NA 135 138 137  ?K 3.8 3.8 3.9  ?CL 102 105 104  ?CO2 '24 23 28  '$ ?GLUCOSE 105* 98 89  ?BUN '16 17 15  '$ ?CREATININE 0.65 0.77 0.57  ?CALCIUM 8.4* 8.6* 8.4*  ?GFRNONAA >60 >60 >60  ?PROT 7.1 7.0 6.9  ?ALBUMIN 4.3 4.1 4.0  ?AST '24 27 25  '$ ?ALT 32 45* 44  ?ALKPHOS 68 79 70  ?BILITOT 0.7 0.6 0.6  ? ? ? ? ?No results found. ? ?ASSESSMENT AND PLAN ? ?Iron deficiency anemia- secondary to gastric bypass/malabsorption. July 2020 hemoglobin 11.5, ferritin 4. Today, hmg 12.9, nomocytic. Ferritin down trending. Baseline in 70-90, now 28. Symptomatic. Plan for venofer x 2.  ?B12 deficiency- secondary to malabsorption. Continue every other month injection of 1000 mcg/53m.   ?Anxiety- mixed depressive and anxiety symptoms. Does not complain of this today. Continue follow up with pcp.   ? ?  ?DISPOSITION: ?Venofer x 2  ?B12 every other month ?Rtc in 6 months for labs (cbc, ferritin, iron studies, b12) ?Day to week later see Dr B (virtual or in person). If in person, also +/- venofer- la ? ?No problem-specific Assessment & Plan notes found for this encounter. ? ?I discussed  the assessment and treatment plan with the patient. The patient was provided an opportunity to ask questions and all were answered. The patient agreed with the plan and demonstrated an understanding of the instructions. ?  ?The

## 2021-10-05 ENCOUNTER — Encounter: Payer: Self-pay | Admitting: Internal Medicine

## 2021-10-05 NOTE — Progress Notes (Signed)
Appt cancelled

## 2021-11-03 ENCOUNTER — Other Ambulatory Visit: Payer: Self-pay | Admitting: Family Medicine

## 2021-11-03 DIAGNOSIS — K219 Gastro-esophageal reflux disease without esophagitis: Secondary | ICD-10-CM

## 2021-12-19 ENCOUNTER — Ambulatory Visit
Admission: EM | Admit: 2021-12-19 | Discharge: 2021-12-19 | Disposition: A | Discharge status: Home / Self Care | Payer: BC Managed Care – PPO | Attending: Family Medicine | Admitting: Family Medicine

## 2021-12-19 DIAGNOSIS — S39012A Strain of muscle, fascia and tendon of lower back, initial encounter: Secondary | ICD-10-CM

## 2021-12-19 MED ORDER — KETOROLAC TROMETHAMINE 30 MG/ML IJ SOLN
30.0000 mg | Freq: Once | INTRAMUSCULAR | Status: AC
  Administered 2021-12-19: 30 mg via INTRAMUSCULAR

## 2021-12-19 MED ORDER — PREDNISONE 20 MG PO TABS: 40.0000 mg | ORAL_TABLET | Freq: Every day | ORAL | 0 refills | Status: DC
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
## 2021-12-19 NOTE — ED Triage Notes (Signed)
Pt reports lower back pain after moving a couch 3 days ago. Ice pack, lidocaine patch gives no releif. Pt reports se is taking steroids and antibiotics for  upper respiratory infection.

## 2022-03-07 ENCOUNTER — Other Ambulatory Visit: Payer: Self-pay | Admitting: Internal Medicine

## 2022-03-08 ENCOUNTER — Encounter: Payer: Self-pay | Admitting: Internal Medicine

## 2022-08-04 ENCOUNTER — Other Ambulatory Visit: Payer: Self-pay | Admitting: Internal Medicine

## 2022-08-04 DIAGNOSIS — K219 Gastro-esophageal reflux disease without esophagitis: Secondary | ICD-10-CM

## 2022-08-05 NOTE — Telephone Encounter (Signed)
Patient needs OV, will refill medication for 30 days until OV can be made. OV needed for additional refills.  Requested Prescriptions  Pending Prescriptions Disp Refills   pantoprazole (PROTONIX) 40 MG tablet [Pharmacy Med Name: PANTOPRAZOLE SOD DR 40 MG TAB] 30 tablet 0    Sig: TAKE 1 TABLET BY MOUTH EVERY DAY     Gastroenterology: Proton Pump Inhibitors Failed - 08/04/2022  1:09 AM      Failed - Valid encounter within last 12 months    Recent Outpatient Visits           1 year ago Bipolar affective disorder, currently depressed, moderate ([place])   [facility] Steele Sizer, MD   1 year ago Bipolar affective disorder, currently depressed, moderate [facility])   [facility] Steele Sizer, MD   2 years ago Vitamin D deficiency   [facility] Steele Sizer, MD   3 years ago Bipolar affective disorder, currently depressed, moderate [facility])   [facility] Steele Sizer, MD   3 years ago Thrombocytosis [facility])   [facility] Steele Sizer, MD

## 2022-09-05 ENCOUNTER — Other Ambulatory Visit: Payer: Self-pay | Admitting: Internal Medicine

## 2022-09-05 DIAGNOSIS — K219 Gastro-esophageal reflux disease without esophagitis: Secondary | ICD-10-CM

## 2022-09-05 NOTE — Telephone Encounter (Signed)
Requested medication (s) are due for refill today: Yes  Requested medication (s) are on the active medication list: Yes  Last refill:  08/05/22  Future visit scheduled: No  Notes to clinic:  Pharmacy requests 90 day supply and diagnosis code.    Requested Prescriptions  Pending Prescriptions Disp Refills   pantoprazole (PROTONIX) 40 MG tablet [Pharmacy Med Name: PANTOPRAZOLE SOD DR 40 MG TAB] 90 tablet 1    Sig: TAKE 1 TABLET BY MOUTH EVERY DAY     Gastroenterology: Proton Pump Inhibitors Failed - 09/05/2022 11:31 AM      Failed - Valid encounter within last 12 months    Recent Outpatient Visits           1 year ago Bipolar affective disorder, currently depressed, moderate ([place])   [facility] Steele Sizer, MD   1 year ago Bipolar affective disorder, currently depressed, moderate [facility])   [facility] Steele Sizer, MD   2 years ago Vitamin D deficiency   [facility] Steele Sizer, MD   3 years ago Bipolar affective disorder, currently depressed, moderate [facility])   [facility] Steele Sizer, MD   3 years ago Thrombocytosis [facility])   [facility] Steele Sizer, MD

## 2022-11-28 ENCOUNTER — Inpatient Hospital Stay: Payer: BC Managed Care – PPO | Attending: Internal Medicine

## 2022-11-28 ENCOUNTER — Inpatient Hospital Stay (HOSPITAL_BASED_OUTPATIENT_CLINIC_OR_DEPARTMENT_OTHER): Payer: BC Managed Care – PPO | Admitting: Internal Medicine

## 2022-11-28 ENCOUNTER — Telehealth: Payer: Self-pay

## 2022-11-28 ENCOUNTER — Encounter: Payer: Self-pay | Admitting: Internal Medicine

## 2022-11-28 VITALS — BP 121/79 | HR 70 | Temp 97.3°F | Ht 70.0 in | Wt 173.0 lb

## 2022-11-28 DIAGNOSIS — D708 Other neutropenia: Secondary | ICD-10-CM

## 2022-11-28 DIAGNOSIS — E785 Hyperlipidemia, unspecified: Secondary | ICD-10-CM | POA: Diagnosis not present

## 2022-11-28 DIAGNOSIS — M2550 Pain in unspecified joint: Secondary | ICD-10-CM | POA: Insufficient documentation

## 2022-11-28 DIAGNOSIS — E119 Type 2 diabetes mellitus without complications: Secondary | ICD-10-CM | POA: Insufficient documentation

## 2022-11-28 DIAGNOSIS — R509 Fever, unspecified: Secondary | ICD-10-CM | POA: Insufficient documentation

## 2022-11-28 DIAGNOSIS — M791 Myalgia, unspecified site: Secondary | ICD-10-CM | POA: Insufficient documentation

## 2022-11-28 DIAGNOSIS — G8929 Other chronic pain: Secondary | ICD-10-CM | POA: Diagnosis not present

## 2022-11-28 DIAGNOSIS — R5383 Other fatigue: Secondary | ICD-10-CM | POA: Diagnosis not present

## 2022-11-28 DIAGNOSIS — E538 Deficiency of other specified B group vitamins: Secondary | ICD-10-CM | POA: Insufficient documentation

## 2022-11-28 DIAGNOSIS — D508 Other iron deficiency anemias: Secondary | ICD-10-CM | POA: Diagnosis not present

## 2022-11-28 DIAGNOSIS — J45909 Unspecified asthma, uncomplicated: Secondary | ICD-10-CM | POA: Diagnosis not present

## 2022-11-28 DIAGNOSIS — Z79899 Other long term (current) drug therapy: Secondary | ICD-10-CM | POA: Insufficient documentation

## 2022-11-28 DIAGNOSIS — Z9884 Bariatric surgery status: Secondary | ICD-10-CM | POA: Diagnosis not present

## 2022-11-28 DIAGNOSIS — D509 Iron deficiency anemia, unspecified: Secondary | ICD-10-CM | POA: Diagnosis present

## 2022-11-28 DIAGNOSIS — K760 Fatty (change of) liver, not elsewhere classified: Secondary | ICD-10-CM | POA: Diagnosis not present

## 2022-11-28 DIAGNOSIS — I10 Essential (primary) hypertension: Secondary | ICD-10-CM | POA: Insufficient documentation

## 2022-11-28 DIAGNOSIS — R42 Dizziness and giddiness: Secondary | ICD-10-CM | POA: Insufficient documentation

## 2022-11-28 DIAGNOSIS — R630 Anorexia: Secondary | ICD-10-CM | POA: Diagnosis not present

## 2022-11-28 LAB — COMPREHENSIVE METABOLIC PANEL
ALT: 107 U/L — ABNORMAL HIGH (ref 0–44)
AST: 43 U/L — ABNORMAL HIGH (ref 15–41)
Albumin: 3.8 g/dL (ref 3.5–5.0)
Alkaline Phosphatase: 76 U/L (ref 38–126)
Anion gap: 6 (ref 5–15)
BUN: 12 mg/dL (ref 6–20)
CO2: 27 mmol/L (ref 22–32)
Calcium: 8.4 mg/dL — ABNORMAL LOW (ref 8.9–10.3)
Chloride: 105 mmol/L (ref 98–111)
Creatinine, Ser: 0.66 mg/dL (ref 0.44–1.00)
GFR, Estimated: >60 mL/min (ref >60)
Glucose, Bld: 76 mg/dL (ref 70–99)
Potassium: 4.6 mmol/L (ref 3.5–5.1)
Sodium: 138 mmol/L (ref 135–145)
Total Bilirubin: 0.2 mg/dL — ABNORMAL LOW (ref 0.3–1.2)
Total Protein: 6.7 g/dL (ref 6.5–8.1)

## 2022-11-28 LAB — CBC WITH DIFFERENTIAL/PLATELET
Abs Immature Granulocytes: 0.01 10*3/uL (ref 0.00–0.07)
Basophils Absolute: 0.1 10*3/uL (ref 0.0–0.1)
Basophils Relative: 2 %
Eosinophils Absolute: 0 10*3/uL (ref 0.0–0.5)
Eosinophils Relative: 1 %
HCT: 35.9 % — ABNORMAL LOW (ref 36.0–46.0)
Hemoglobin: 11.7 g/dL — ABNORMAL LOW (ref 12.0–15.0)
Immature Granulocytes: 0 %
Lymphocytes Relative: 28 %
Lymphs Abs: 1.2 10*3/uL (ref 0.7–4.0)
MCH: 31 pg (ref 26.0–34.0)
MCHC: 32.6 g/dL (ref 30.0–36.0)
MCV: 95 fL (ref 80.0–100.0)
Monocytes Absolute: 0.3 10*3/uL (ref 0.1–1.0)
Monocytes Relative: 8 %
Neutro Abs: 2.5 10*3/uL (ref 1.7–7.7)
Neutrophils Relative %: 61 %
Platelets: 280 10*3/uL (ref 150–400)
RBC: 3.78 MIL/uL — ABNORMAL LOW (ref 3.87–5.11)
RDW: 11.6 % (ref 11.5–15.5)
Smear Review: ADEQUATE
WBC: 4.1 10*3/uL (ref 4.0–10.5)
nRBC: 0 % (ref 0.0–0.2)

## 2022-11-28 LAB — TECHNOLOGIST SMEAR REVIEW: Plt Morphology: ADEQUATE

## 2022-11-28 LAB — RETICULOCYTES
Immature Retic Fract: 5.7 % (ref 2.3–15.9)
RBC.: 3.76 MIL/uL — ABNORMAL LOW (ref 3.87–5.11)
Retic Count, Absolute: 69.9 10*3/uL (ref 19.0–186.0)
Retic Ct Pct: 1.9 % (ref 0.4–3.1)

## 2022-11-28 LAB — IRON AND TIBC
Iron: 60 ug/dL (ref 28–170)
Saturation Ratios: 19 % (ref 10.4–31.8)
TIBC: 311 ug/dL (ref 250–450)
UIBC: 251 ug/dL

## 2022-11-28 LAB — FERRITIN: Ferritin: 69 ng/mL (ref 11–307)

## 2022-11-28 LAB — IMMATURE PLATELET FRACTION: Immature Platelet Fraction: 1.4 % (ref 1.2–8.6)

## 2022-11-28 MED ORDER — TRAMADOL HCL 50 MG PO TABS: 50.0000 mg | ORAL_TABLET | Freq: Three times a day (TID) | ORAL | 0 refills | Status: AC | PRN

## 2022-11-28 NOTE — Assessment & Plan Note (Addendum)
#   Severe neutropenia/Lymphopenic-June 2024 white count 1.1 platlets- 113; HB-13.  Likely secondary to tick bite see below.  Unlikely any malignancy.  Discussed that a bone marrow biopsy would be recommended if counts do not improve OR get worse.  # Tick bite related illness-manifesting as skin rash joint pains extreme fatigue.  Patient currently doxycycline.  Overall symptoms improving not resolved.  Monitor closely if worse recommend follow-up with PCP.  # Iron deficiency anemia secondary to gastric bypass/malabsorption-recheck iron levels/liver levels etc.  Consider iron infusion if low at next visit.  Patient on at home B12 injections once a month.   DISPOSITION:  # labs today- cbc; cmp; LDH; iron studies; ferritin; B12 levels; review of smear;  # weekly cbc x3 # follow up  in 3 weeks- with APP; labs- cbc; cmp ; possible venofer- Dr.B  Addendum: Patient's white count; platelet count within normal limits.  Hemoglobin 11.7.  Will cancel weekly CBC.  LFTs slightly elevated likely secondary to tickborne illness.  Will recheck again in 3 weeks.  Follow-up with APP as planned-

## 2022-11-28 NOTE — Addendum Note (Signed)
Addended by: Clydia Llano on: 11/28/2022 01:48 PM   Modules accepted: Orders

## 2022-11-28 NOTE — Addendum Note (Signed)
Addended by: Clydia Llano on: 11/28/2022 01:49 PM   Modules accepted: Orders

## 2022-11-28 NOTE — Progress Notes (Signed)
ED f/u from last week. ED recommends appt for f/u on lab work done there.  Tick bite, pulled 5 ticks off 2 weeks ago, has had fever, joint pain, fatigue. On doxycyline.  Fatigue/weakness: yes Dyspena: Light headedness: yes Blood in stool:   She states her Bowel movements are yellow/brown color and jelly-like.  C/o hands itching and swelling.

## 2022-11-28 NOTE — Assessment & Plan Note (Deleted)
#  Iron deficiency anemia secondary to gastric bypass/malabsorption-July 2020 hemoglobin-11.5 however, ferritin 4.; JAN 2022-Hb-13.9; Iron studies/pending.  Hold IV iron infusion today/see below  #Fatigue/question etiology-question B12 deficiency/malabsorption patient unable to refill; will start patient on B12 intramuscular injection q Monthly.   # Insomnia- ? Anxiety; has tried melatonin in past- defer to PCP.   DISPOSITION:  # HOLD venofer  #  Follow up 6 months- MD;possible venofer. Labs- CBC, CMP, iron studies with ferritin; B12 levels--Dr.B 

## 2022-11-28 NOTE — Telephone Encounter (Signed)
Per chat with Dr. B: please inform that white count; platelet count within normal limits Liver numbers are slightly elevated likely secondary to tickborne illness. Will recheck again in 3 weeks-CBC CMP; please cancel weekly CBC inbetween-   Pt notified by message, labs cancelled, kept 7/17 appts/lab.

## 2022-11-28 NOTE — Progress Notes (Signed)
[facility] CONSULT NOTE  Patient Care Team: Alba Cory, MD as PCP - General (Family Medicine)  CHIEF COMPLAINTS/PURPOSE OF CONSULTATION: Anemia iron deficiency   HEMATOLOGY HISTORY  #Iron deficiency/gastric bypass-prior history of iron IV infusions [[facility]]; intolerance to p.o. iron.  # B12 deficiency- on B12 injection/ monthly [ script sent]  # DUODENAL SWITCH [~ 125 pounds weight loss/ Oct 2014; [facility] ]   HISTORY OF PRESENTING ILLNESS: Patient ambulating-independently. Alone/Accompanied by husband.   Kiara Le 47 y.o.  female is here for follow-up given her anemia/secondary malabsorption gastric surgery; and new onset of leucopenia is here for a follow up.  f/u from last week.  Tick bite, pulled 5 ticks off 2 weeks ago, has had fever, joint pain, fatigue. On doxycyline.  Patient noted to have significant leukopenia white count 1.1 with lymphopenia and neutropenia.  Patient continues to have fatigue and joint pains.  Denies any worsening fever.  Complains of dizziness.  Complains of significant myalgias.  Denies any blood in stools or black-colored stools.  Poor appetite.   Review of Systems  Constitutional:  Positive for malaise/fatigue. Negative for chills, diaphoresis, fever and weight loss.  HENT:  Negative for nosebleeds and sore throat.   Eyes:  Negative for double vision.  Respiratory:  Negative for cough, hemoptysis, sputum production, shortness of breath and wheezing.   Cardiovascular:  Negative for chest pain, palpitations, orthopnea and leg swelling.  Gastrointestinal:  Positive for nausea. Negative for abdominal pain, blood in stool, constipation, diarrhea, heartburn, melena and vomiting.  Genitourinary:  Negative for dysuria, frequency and urgency.  Musculoskeletal:  Positive for back pain, myalgias and neck pain. Negative for joint pain.  Skin:  Positive for itching and rash.  Neurological:  Positive for dizziness. Negative for tingling, focal  weakness, weakness and headaches.  Endo/Heme/Allergies:  Does not bruise/bleed easily.  Psychiatric/Behavioral:  Negative for depression. The patient is not nervous/anxious and does not have insomnia.     MEDICAL HISTORY:  Past Medical History:  Diagnosis Date   Anxiety    Asthma    Bipolar disorder (HCC)    Chronic fatigue    Chronic low back pain    Diabetes mellitus without complication (HCC)    Fatty liver disease, nonalcoholic    History of Helicobacter pylori infection    Hyperlipidemia    Hypertension    Insomnia    Morbid obesity (HCC)    Panic disorder    PTSD (post-traumatic stress disorder)    Vitamin B12 deficiency    Vitamin D deficiency     SURGICAL HISTORY: Past Surgical History:  Procedure Laterality Date   ABDOMINAL HYSTERECTOMY     GASTROPLASTY DUODENAL SWITCH     PARTIAL HYSTERECTOMY     SALPINGECTOMY     TUBAL LIGATION      SOCIAL HISTORY: Social History   Socioeconomic History   Marital status: Married    Spouse name: Rutherford Nail   Number of children: 2   Years of education: Not on file   Highest education level: Not on file  Occupational History    Comment: lost job because of COVID-19   Tobacco Use   Smoking status: Never   Smokeless tobacco: Never  Vaping Use   Vaping Use: Never used  Substance and Sexual Activity   Alcohol use: No    Alcohol/week: 0.0 standard drinks of alcohol   Drug use: No   Sexual activity: Yes    Partners: Male    Birth control/protection: None  Other Topics Concern   Not on file  Social History Narrative   Married; Lost job because of COVID-19; non-smoker; ocassional alcohol. [employer]   Social Determinants of Health   Financial Resource Strain: Medium Risk (03/11/2018)   Overall Financial Resource Strain (CARDIA)    Difficulty of Paying Living Expenses: Somewhat hard  Food Insecurity: No Food Insecurity (03/11/2018)   Hunger Vital Sign    Worried About Running Out of Food in the Last Year: Never true     Ran Out of Food in the Last Year: Never true  Transportation Needs: No Transportation Needs (03/11/2018)   PRAPARE - Administrator, Civil Service (Medical): No    Lack of Transportation (Non-Medical): No  Physical Activity: Sufficiently Active (03/11/2018)   Exercise Vital Sign    Days of Exercise per Week: 4 days    Minutes of Exercise per Session: 60 min  Stress: Stress Concern Present (03/11/2018)   [employer] of Occupational Health - Occupational Stress Questionnaire    Feeling of Stress : To some extent  Social Connections: Not on file  Intimate Partner Violence: Not At Risk (03/11/2018)   Humiliation, Afraid, Rape, and Kick questionnaire    Fear of Current or Ex-Partner: No    Emotionally Abused: No    Physically Abused: No    Sexually Abused: No    FAMILY HISTORY: Family History  Problem Relation Age of Onset   Drug abuse Mother    Heart disease Father    Hypertension Father    Hyperlipidemia Father    Anxiety disorder Sister    Depression Sister    Dementia Paternal Grandmother    Breast cancer Neg Hx     ALLERGIES:  is allergic to other; 2,4-d dimethylamine; bee venom; gabapentin; nsaids; tetanus toxoid adsorbed; and tetanus toxoids.  MEDICATIONS:  Current Outpatient Medications  Medication Sig Dispense Refill   cyanocobalamin (VITAMIN B12) 1000 MCG/ML injection INJECT 1 ML (1,000 MCG TOTAL) INTO THE MUSCLE EVERY 30 (THIRTY) DAYS FOR 1 DOSE. 1 mL 12   doxycycline (VIBRAMYCIN) 100 MG capsule Take 100 mg by mouth 2 (two) times daily.     pantoprazole (PROTONIX) 40 MG tablet TAKE 1 TABLET BY MOUTH EVERY DAY 90 tablet 1   traMADol (ULTRAM) 50 MG tablet Take 1 tablet (50 mg total) by mouth every 8 (eight) hours as needed. 45 tablet 0   No current facility-administered medications for this visit.      PHYSICAL EXAMINATION:   Vitals:   11/28/22 0931  BP: 121/79  Pulse: 70  Temp: (!) 97.3 F (36.3 C)  SpO2: 100%   Filed Weights   11/28/22  0931  Weight: 173 lb (78.5 kg)    Physical Exam HENT:     Head: Normocephalic and atraumatic.     Mouth/Throat:     Pharynx: No oropharyngeal exudate.  Eyes:     Pupils: Pupils are equal, round, and reactive to light.  Cardiovascular:     Rate and Rhythm: Normal rate and regular rhythm.  Pulmonary:     Effort: No respiratory distress.     Breath sounds: No wheezing.  Abdominal:     General: Bowel sounds are normal. There is no distension.     Palpations: Abdomen is soft. There is no mass.     Tenderness: There is no abdominal tenderness. There is no guarding or rebound.  Musculoskeletal:        General: No tenderness. Normal range of motion.     Cervical  back: Normal range of motion and neck supple.  Skin:    General: Skin is warm.  Neurological:     Mental Status: She is alert and oriented to person, place, and time.  Psychiatric:        Mood and Affect: Affect normal.     LABORATORY DATA:  I have reviewed the data as listed Lab Results  Component Value Date   WBC 4.1 11/28/2022   HGB 11.7 (L) 11/28/2022   HCT 35.9 (L) 11/28/2022   MCV 95.0 11/28/2022   PLT 280 11/28/2022   Recent Labs    11/28/22 1104  NA 138  K 4.6  CL 105  CO2 27  GLUCOSE 76  BUN 12  CREATININE 0.66  CALCIUM 8.4*  GFRNONAA >60  PROT 6.7  ALBUMIN 3.8  AST 43*  ALT 107*  ALKPHOS 76  BILITOT 0.2*    No results found.  Other neutropenia (HCC) # Severe neutropenia/Lymphopenic-June 2024 white count 1.1 platlets- 113; HB-13.  Likely secondary to tick bite see below.  Unlikely any malignancy.  Discussed that a bone marrow biopsy would be recommended if counts do not improve OR get worse.  # Tick bite related illness-manifesting as skin rash joint pains extreme fatigue.  Patient currently doxycycline.  Overall symptoms improving not resolved.  Monitor closely if worse recommend follow-up with PCP.  # Iron deficiency anemia secondary to gastric bypass/malabsorption-recheck iron  levels/liver levels etc.  Consider iron infusion if low at next visit.  Patient on at home B12 injections once a month.   DISPOSITION:  # labs today- cbc; cmp; LDH; iron studies; ferritin; B12 levels; review of smear;  # weekly cbc x3 # follow up  in 3 weeks- with APP; labs- cbc; cmp ; possible venofer- Dr.B  Addendum: Patient's white count; platelet count within normal limits.  Hemoglobin 11.7.  Will cancel weekly CBC.  LFTs slightly elevated likely secondary to tickborne illness.  Will recheck again in 3 weeks.  Follow-up with APP as planned-  All questions were answered. The patient knows to call the clinic with any problems, questions or concerns.    Earna Coder, MD 11/28/2022 12:20 PM

## 2022-11-29 LAB — VITAMIN B12: Vitamin B-12: 989 pg/mL — ABNORMAL HIGH (ref 180–914)

## 2022-11-29 LAB — C-REACTIVE PROTEIN: CRP: <0.5 mg/dL

## 2022-12-04 ENCOUNTER — Other Ambulatory Visit: Payer: BC Managed Care – PPO

## 2022-12-11 ENCOUNTER — Other Ambulatory Visit: Payer: BC Managed Care – PPO

## 2022-12-17 ENCOUNTER — Other Ambulatory Visit: Payer: Self-pay

## 2022-12-17 DIAGNOSIS — D519 Vitamin B12 deficiency anemia, unspecified: Secondary | ICD-10-CM

## 2022-12-18 ENCOUNTER — Encounter: Payer: Self-pay | Admitting: Medical Oncology

## 2022-12-18 ENCOUNTER — Other Ambulatory Visit: Payer: BC Managed Care – PPO

## 2022-12-18 ENCOUNTER — Inpatient Hospital Stay: Payer: BC Managed Care – PPO | Attending: Internal Medicine

## 2022-12-18 ENCOUNTER — Inpatient Hospital Stay: Payer: BC Managed Care – PPO

## 2022-12-18 ENCOUNTER — Inpatient Hospital Stay: Payer: BC Managed Care – PPO | Admitting: Medical Oncology

## 2022-12-18 VITALS — BP 121/72 | HR 85 | Temp 97.2°F | Wt 175.0 lb

## 2022-12-18 DIAGNOSIS — Z90710 Acquired absence of both cervix and uterus: Secondary | ICD-10-CM | POA: Diagnosis not present

## 2022-12-18 DIAGNOSIS — Z9884 Bariatric surgery status: Secondary | ICD-10-CM | POA: Diagnosis not present

## 2022-12-18 DIAGNOSIS — E538 Deficiency of other specified B group vitamins: Secondary | ICD-10-CM | POA: Diagnosis not present

## 2022-12-18 DIAGNOSIS — K909 Intestinal malabsorption, unspecified: Secondary | ICD-10-CM | POA: Diagnosis not present

## 2022-12-18 DIAGNOSIS — D509 Iron deficiency anemia, unspecified: Secondary | ICD-10-CM | POA: Diagnosis not present

## 2022-12-18 DIAGNOSIS — D519 Vitamin B12 deficiency anemia, unspecified: Secondary | ICD-10-CM

## 2022-12-18 DIAGNOSIS — D708 Other neutropenia: Secondary | ICD-10-CM | POA: Diagnosis not present

## 2022-12-18 DIAGNOSIS — D508 Other iron deficiency anemias: Secondary | ICD-10-CM | POA: Diagnosis not present

## 2022-12-18 LAB — CBC WITH DIFFERENTIAL/PLATELET
Abs Immature Granulocytes: 0.01 10*3/uL (ref 0.00–0.07)
Basophils Absolute: 0.1 10*3/uL (ref 0.0–0.1)
Basophils Relative: 1 %
Eosinophils Absolute: 0.4 10*3/uL (ref 0.0–0.5)
Eosinophils Relative: 9 %
HCT: 37.1 % (ref 36.0–46.0)
Hemoglobin: 12.3 g/dL (ref 12.0–15.0)
Immature Granulocytes: 0 %
Lymphocytes Relative: 29 %
Lymphs Abs: 1.4 10*3/uL (ref 0.7–4.0)
MCH: 30.8 pg (ref 26.0–34.0)
MCHC: 33.2 g/dL (ref 30.0–36.0)
MCV: 93 fL (ref 80.0–100.0)
Monocytes Absolute: 0.3 10*3/uL (ref 0.1–1.0)
Monocytes Relative: 7 %
Neutro Abs: 2.5 10*3/uL (ref 1.7–7.7)
Neutrophils Relative %: 54 %
Platelets: 178 10*3/uL (ref 150–400)
RBC: 3.99 MIL/uL (ref 3.87–5.11)
RDW: 12.1 % (ref 11.5–15.5)
WBC: 4.6 10*3/uL (ref 4.0–10.5)
nRBC: 0 % (ref 0.0–0.2)

## 2022-12-18 LAB — CMP (CANCER CENTER ONLY)
ALT: 38 U/L (ref 0–44)
AST: 27 U/L (ref 15–41)
Albumin: 3.9 g/dL (ref 3.5–5.0)
Alkaline Phosphatase: 78 U/L (ref 38–126)
Anion gap: 7 (ref 5–15)
BUN: 14 mg/dL (ref 6–20)
CO2: 22 mmol/L (ref 22–32)
Calcium: 8.5 mg/dL — ABNORMAL LOW (ref 8.9–10.3)
Chloride: 107 mmol/L (ref 98–111)
Creatinine: 0.65 mg/dL (ref 0.44–1.00)
GFR, Estimated: >60 mL/min (ref >60)
Glucose, Bld: 88 mg/dL (ref 70–99)
Potassium: 3.5 mmol/L (ref 3.5–5.1)
Sodium: 136 mmol/L (ref 135–145)
Total Bilirubin: 0.4 mg/dL (ref 0.3–1.2)
Total Protein: 7 g/dL (ref 6.5–8.1)

## 2022-12-18 NOTE — Progress Notes (Signed)
[facility] CONSULT NOTE  Patient Care Team: Alba Cory, MD as PCP - General (Family Medicine)  CHIEF COMPLAINTS/PURPOSE OF CONSULTATION: Anemia iron deficiency   HEMATOLOGY HISTORY  #Iron deficiency/gastric bypass-prior history of iron IV infusions [[facility]]; intolerance to p.o. iron.  # B12 deficiency- on B12 injection/ monthly [ script sent]  # DUODENAL SWITCH [~ 125 pounds weight loss/ Oct 2014; [facility] ]   HISTORY OF PRESENTING ILLNESS: Patient ambulating-independently. Alone/Accompanied by husband.   Kiara Le 47 y.o.  female is here for follow-up given her anemia/secondary malabsorption gastric surgery; with resolving leukopenia secondary to tick bourne illness:  Pt was found to have new onset leukopenia and acute on chronic anemia felt to be secondary to tick bourne illness about 1 month ago. She was treated with doxycyline. Her symptoms of fatigue, joint pains, nausea, fever have all resolved. Her last set of labs prior to today was on 11/28/2022 which showed a Hgb of 11.7, iron saturation of 19%, ferritin of 69.   Today she reports that she is doing well. Has mild chronic fatigue but is otherwise well. She denies and bleeding or bruising episodes. No fevers, illness, etc. She does report that some foods tends to cause joint pains-  mainly dairy products. She continues her bariatric supplements.     Review of Systems  Constitutional:  Positive for malaise/fatigue. Negative for chills, diaphoresis, fever and weight loss.  HENT:  Negative for nosebleeds and sore throat.   Eyes:  Negative for double vision.  Respiratory:  Negative for cough, hemoptysis, sputum production, shortness of breath and wheezing.   Cardiovascular:  Negative for chest pain, palpitations, orthopnea and leg swelling.  Gastrointestinal:  Negative for abdominal pain, blood in stool, constipation, diarrhea, heartburn, melena, nausea and vomiting.  Genitourinary:  Negative for dysuria,  frequency and urgency.  Musculoskeletal:  Positive for myalgias. Negative for back pain, joint pain and neck pain.  Skin:  Negative for itching and rash.  Neurological:  Negative for dizziness, tingling, focal weakness, weakness and headaches.  Endo/Heme/Allergies:  Does not bruise/bleed easily.  Psychiatric/Behavioral:  Negative for depression. The patient is not nervous/anxious and does not have insomnia.     MEDICAL HISTORY:  Past Medical History:  Diagnosis Date   Anxiety    Asthma    Bipolar disorder (HCC)    Chronic fatigue    Chronic low back pain    Diabetes mellitus without complication (HCC)    Fatty liver disease, nonalcoholic    History of Helicobacter pylori infection    Hyperlipidemia    Hypertension    Insomnia    Morbid obesity (HCC)    Panic disorder    PTSD (post-traumatic stress disorder)    Vitamin B12 deficiency    Vitamin D deficiency     SURGICAL HISTORY: Past Surgical History:  Procedure Laterality Date   ABDOMINAL HYSTERECTOMY     GASTROPLASTY DUODENAL SWITCH     PARTIAL HYSTERECTOMY     SALPINGECTOMY     TUBAL LIGATION      SOCIAL HISTORY: Social History   Socioeconomic History   Marital status: Married    Spouse name: Rutherford Nail   Number of children: 2   Years of education: Not on file   Highest education level: Not on file  Occupational History    Comment: lost job because of COVID-19   Tobacco Use   Smoking status: Never   Smokeless tobacco: Never  Vaping Use   Vaping status: Never Used  Substance and  Sexual Activity   Alcohol use: No    Alcohol/week: 0.0 standard drinks of alcohol   Drug use: No   Sexual activity: Yes    Partners: Male    Birth control/protection: None  Other Topics Concern   Not on file  Social History Narrative   Married; Lost job because of COVID-19; non-smoker; ocassional alcohol. [employer]   Social Determinants of Health   Financial Resource Strain: Medium Risk (03/11/2018)   Overall Financial Resource  Strain (CARDIA)    Difficulty of Paying Living Expenses: Somewhat hard  Food Insecurity: No Food Insecurity (03/11/2018)   Hunger Vital Sign    Worried About Running Out of Food in the Last Year: Never true    Ran Out of Food in the Last Year: Never true  Transportation Needs: No Transportation Needs (03/11/2018)   PRAPARE - Administrator, Civil Service (Medical): No    Lack of Transportation (Non-Medical): No  Physical Activity: Sufficiently Active (03/11/2018)   Exercise Vital Sign    Days of Exercise per Week: 4 days    Minutes of Exercise per Session: 60 min  Stress: Stress Concern Present (03/11/2018)   [employer] of Occupational Health - Occupational Stress Questionnaire    Feeling of Stress : To some extent  Social Connections: Unknown (10/05/2021)   Received from [facility]   Social Network    Social Network: Not on file  Intimate Partner Violence: Unknown (09/07/2021)   Received from [facility], [facility]   HITS    Physically Hurt: Not on file    Insult or Talk Down To: Not on file    Threaten Physical Harm: Not on file    Scream or Curse: Not on file    FAMILY HISTORY: Family History  Problem Relation Age of Onset   Drug abuse Mother    Heart disease Father    Hypertension Father    Hyperlipidemia Father    Anxiety disorder Sister    Depression Sister    Dementia Paternal Grandmother    Breast cancer Neg Hx     ALLERGIES:  is allergic to other; 2,4-d dimethylamine; bee venom; gabapentin; nsaids; tetanus toxoid adsorbed; and tetanus toxoids.  MEDICATIONS:  Current Outpatient Medications  Medication Sig Dispense Refill   cyanocobalamin (VITAMIN B12) 1000 MCG/ML injection INJECT 1 ML (1,000 MCG TOTAL) INTO THE MUSCLE EVERY 30 (THIRTY) DAYS FOR 1 DOSE. 1 mL 12   pantoprazole (PROTONIX) 40 MG tablet TAKE 1 TABLET BY MOUTH EVERY DAY 90 tablet 1   traMADol (ULTRAM) 50 MG tablet Take 1 tablet (50 mg total) by mouth every 8  (eight) hours as needed. 45 tablet 0   No current facility-administered medications for this visit.    PHYSICAL EXAMINATION:  Vitals:   12/18/22 1321  BP: 121/72  Pulse: 85  Temp: (!) 97.2 F (36.2 C)  SpO2: 99%   Filed Weights   12/18/22 1321  Weight: 175 lb (79.4 kg)    Physical Exam HENT:     Head: Normocephalic and atraumatic.     Mouth/Throat:     Pharynx: No oropharyngeal exudate.  Eyes:     Pupils: Pupils are equal, round, and reactive to light.  Cardiovascular:     Rate and Rhythm: Normal rate and regular rhythm.  Pulmonary:     Effort: No respiratory distress.     Breath sounds: No wheezing.  Musculoskeletal:        General: No tenderness. Normal range of motion.  Cervical back: Normal range of motion and neck supple.     Right lower leg: No edema.     Left lower leg: No edema.  Skin:    General: Skin is warm.  Neurological:     Mental Status: She is alert and oriented to person, place, and time.  Psychiatric:        Mood and Affect: Affect normal.     LABORATORY DATA:  I have reviewed the data as listed Lab Results  Component Value Date   WBC 4.6 12/18/2022   HGB 12.3 12/18/2022   HCT 37.1 12/18/2022   MCV 93.0 12/18/2022   PLT 178 12/18/2022   Recent Labs    11/28/22 1104  NA 138  K 4.6  CL 105  CO2 27  GLUCOSE 76  BUN 12  CREATININE 0.66  CALCIUM 8.4*  GFRNONAA >60  PROT 6.7  ALBUMIN 3.8  AST 43*  ALT 107*  ALKPHOS 76  BILITOT 0.2*   Assessment and Plan:  Other neutropenia (HCC) Severe neutropenia/Lymphopenic-June 2024 WBC 1.1 platelets 113; HB-13   Likely secondary to tick bite Counts have normalized Gentle monitoring for the next few months but unlikely to reoccur    IDA Secondary to malabsorption s/p weight loss surgery- stable B12 and Iron levels pending Hgb 12.3 MCV 93 platelet 178 No venofer needed today  Disposition RTC 3 months labs only (CBC w/, CMP, iron, ferritin,B12 ) RTC 3 months +- few days for  APP/MD, +-[place]ofer-[place]    All questions were answered. The patient knows to call the clinic with any problems, questions or concerns.    Rushie Chestnut, PA-C 12/18/2022 1:31 PM

## 2023-03-17 ENCOUNTER — Other Ambulatory Visit: Payer: Self-pay | Admitting: *Deleted

## 2023-03-17 DIAGNOSIS — D508 Other iron deficiency anemias: Secondary | ICD-10-CM

## 2023-03-18 ENCOUNTER — Inpatient Hospital Stay: Payer: BC Managed Care – PPO | Attending: Internal Medicine

## 2023-03-18 DIAGNOSIS — F419 Anxiety disorder, unspecified: Secondary | ICD-10-CM | POA: Diagnosis not present

## 2023-03-18 DIAGNOSIS — K760 Fatty (change of) liver, not elsewhere classified: Secondary | ICD-10-CM | POA: Diagnosis not present

## 2023-03-18 DIAGNOSIS — E119 Type 2 diabetes mellitus without complications: Secondary | ICD-10-CM | POA: Insufficient documentation

## 2023-03-18 DIAGNOSIS — E538 Deficiency of other specified B group vitamins: Secondary | ICD-10-CM | POA: Diagnosis not present

## 2023-03-18 DIAGNOSIS — E785 Hyperlipidemia, unspecified: Secondary | ICD-10-CM | POA: Insufficient documentation

## 2023-03-18 DIAGNOSIS — D508 Other iron deficiency anemias: Secondary | ICD-10-CM

## 2023-03-18 DIAGNOSIS — Z79899 Other long term (current) drug therapy: Secondary | ICD-10-CM | POA: Insufficient documentation

## 2023-03-18 DIAGNOSIS — I10 Essential (primary) hypertension: Secondary | ICD-10-CM | POA: Insufficient documentation

## 2023-03-18 DIAGNOSIS — E559 Vitamin D deficiency, unspecified: Secondary | ICD-10-CM | POA: Diagnosis not present

## 2023-03-18 DIAGNOSIS — D509 Iron deficiency anemia, unspecified: Secondary | ICD-10-CM | POA: Diagnosis present

## 2023-03-18 DIAGNOSIS — Z9884 Bariatric surgery status: Secondary | ICD-10-CM | POA: Diagnosis not present

## 2023-03-18 DIAGNOSIS — G4700 Insomnia, unspecified: Secondary | ICD-10-CM | POA: Insufficient documentation

## 2023-03-18 DIAGNOSIS — F319 Bipolar disorder, unspecified: Secondary | ICD-10-CM | POA: Insufficient documentation

## 2023-03-18 LAB — CMP (CANCER CENTER ONLY)
ALT: 31 U/L (ref 0–44)
AST: 23 U/L (ref 15–41)
Albumin: 4.1 g/dL (ref 3.5–5.0)
Alkaline Phosphatase: 70 U/L (ref 38–126)
Anion gap: 5 (ref 5–15)
BUN: 15 mg/dL (ref 6–20)
CO2: 26 mmol/L (ref 22–32)
Calcium: 8.6 mg/dL — ABNORMAL LOW (ref 8.9–10.3)
Chloride: 106 mmol/L (ref 98–111)
Creatinine: 0.65 mg/dL (ref 0.44–1.00)
GFR, Estimated: >60 mL/min (ref >60)
Glucose, Bld: 86 mg/dL (ref 70–99)
Potassium: 4.1 mmol/L (ref 3.5–5.1)
Sodium: 137 mmol/L (ref 135–145)
Total Bilirubin: 0.4 mg/dL (ref 0.3–1.2)
Total Protein: 7.2 g/dL (ref 6.5–8.1)

## 2023-03-18 LAB — CBC WITH DIFFERENTIAL (CANCER CENTER ONLY)
Abs Immature Granulocytes: 0.01 10*3/uL (ref 0.00–0.07)
Basophils Absolute: 0 10*3/uL (ref 0.0–0.1)
Basophils Relative: 1 %
Eosinophils Absolute: 0.1 10*3/uL (ref 0.0–0.5)
Eosinophils Relative: 1 %
HCT: 37.4 % (ref 36.0–46.0)
Hemoglobin: 12.6 g/dL (ref 12.0–15.0)
Immature Granulocytes: 0 %
Lymphocytes Relative: 32 %
Lymphs Abs: 1.6 10*3/uL (ref 0.7–4.0)
MCH: 31.2 pg (ref 26.0–34.0)
MCHC: 33.7 g/dL (ref 30.0–36.0)
MCV: 92.6 fL (ref 80.0–100.0)
Monocytes Absolute: 0.3 10*3/uL (ref 0.1–1.0)
Monocytes Relative: 6 %
Neutro Abs: 3.1 10*3/uL (ref 1.7–7.7)
Neutrophils Relative %: 60 %
Platelet Count: 202 10*3/uL (ref 150–400)
RBC: 4.04 MIL/uL (ref 3.87–5.11)
RDW: 12.6 % (ref 11.5–15.5)
WBC Count: 5.1 10*3/uL (ref 4.0–10.5)
nRBC: 0 % (ref 0.0–0.2)

## 2023-03-18 LAB — IRON AND TIBC
Iron: 51 ug/dL (ref 28–170)
Saturation Ratios: 13 % (ref 10.4–31.8)
TIBC: 382 ug/dL (ref 250–450)
UIBC: 331 ug/dL

## 2023-03-18 LAB — FERRITIN: Ferritin: 12 ng/mL (ref 11–307)

## 2023-03-21 ENCOUNTER — Inpatient Hospital Stay: Payer: BC Managed Care – PPO

## 2023-03-21 ENCOUNTER — Inpatient Hospital Stay: Payer: BC Managed Care – PPO | Admitting: Nurse Practitioner

## 2023-03-21 ENCOUNTER — Inpatient Hospital Stay (HOSPITAL_BASED_OUTPATIENT_CLINIC_OR_DEPARTMENT_OTHER): Payer: BC Managed Care – PPO | Admitting: Nurse Practitioner

## 2023-03-21 ENCOUNTER — Encounter: Payer: Self-pay | Admitting: Nurse Practitioner

## 2023-03-21 DIAGNOSIS — E538 Deficiency of other specified B group vitamins: Secondary | ICD-10-CM | POA: Diagnosis not present

## 2023-03-21 DIAGNOSIS — D508 Other iron deficiency anemias: Secondary | ICD-10-CM

## 2023-03-21 DIAGNOSIS — K909 Intestinal malabsorption, unspecified: Secondary | ICD-10-CM

## 2023-03-21 DIAGNOSIS — D519 Vitamin B12 deficiency anemia, unspecified: Secondary | ICD-10-CM

## 2023-03-21 DIAGNOSIS — F418 Other specified anxiety disorders: Secondary | ICD-10-CM

## 2023-03-21 DIAGNOSIS — D509 Iron deficiency anemia, unspecified: Secondary | ICD-10-CM

## 2023-03-21 NOTE — Progress Notes (Signed)
[facility] CONSULT NOTE  Virtual Visit Progress Note  I connected with Kiara Le on 03/21/23 at 3:30 PM EDT by video enabled telemedicine visit and verified that I am speaking with the correct person using two identifiers.   I discussed the limitations, risks, security and privacy concerns of performing an evaluation and management service by telemedicine and the availability of in-person appointments. I also discussed with the patient that there may be a patient responsible charge related to this service. The patient expressed understanding and agreed to proceed.   Other persons participating in the visit and their role in the encounter: none   Patient's location: work  Provider's location: clinic   Patient Care Team: Alba Cory, MD as PCP - General (Family Medicine) Earna Coder, MD as Consulting Physician (Oncology)  CHIEF COMPLAINTS/PURPOSE OF CONSULTATION: Anemia iron deficiency  HEMATOLOGY HISTORY  # Iron deficiency/gastric bypass-prior history of iron IV infusions [[facility]]; intolerance to p.o. iron.  # B12 deficiency- on B12 injection/ monthly [script sent]  # DUODENAL SWITCH [~ 125 pounds weight loss/ Oct 2014; [facility]]   HISTORY OF PRESENTING ILLNESS: Kiara Le 47 y.o. female who agrees to evaluation via telemedicine for history of anemia secondary to gastric surgery. She continues every other month b12 injections at home. Feels like her fatigue has been worsening. Denies any neurologic complaints. Denies recent fevers or illnesses. Denies any easy bleeding or bruising. No melena or hematochezia. No pica or restless leg. Reports good appetite and denies weight loss. Denies chest pain. Denies any nausea, vomiting, constipation, or diarrhea. Denies urinary complaints. Patient offers no further specific complaints today.   Review of Systems  Constitutional:  Positive for malaise/fatigue. Negative for chills, diaphoresis, fever and weight loss.   HENT:  Negative for nosebleeds and sore throat.   Eyes:  Negative for double vision.  Respiratory:  Negative for cough, hemoptysis, sputum production, shortness of breath and wheezing.   Cardiovascular:  Negative for chest pain, palpitations, orthopnea and leg swelling.  Gastrointestinal:  Negative for abdominal pain, blood in stool, constipation, diarrhea, heartburn, melena, nausea and vomiting.  Genitourinary:  Negative for dysuria, frequency and urgency.  Musculoskeletal:  Negative for back pain and joint pain.  Skin: Negative.  Negative for itching and rash.  Neurological:  Negative for dizziness, tingling, focal weakness, weakness and headaches.  Endo/Heme/Allergies:  Does not bruise/bleed easily.  Psychiatric/Behavioral:  Negative for depression. The patient is not nervous/anxious and does not have insomnia.     MEDICAL HISTORY:  Past Medical History:  Diagnosis Date   Anxiety    Asthma    Bipolar disorder (HCC)    Chronic fatigue    Chronic low back pain    Diabetes mellitus without complication (HCC)    Fatty liver disease, nonalcoholic    History of Helicobacter pylori infection    Hyperlipidemia    Hypertension    Insomnia    Morbid obesity (HCC)    Panic disorder    PTSD (post-traumatic stress disorder)    Vitamin B12 deficiency    Vitamin D deficiency     SURGICAL HISTORY: Past Surgical History:  Procedure Laterality Date   ABDOMINAL HYSTERECTOMY     GASTROPLASTY DUODENAL SWITCH     PARTIAL HYSTERECTOMY     SALPINGECTOMY     TUBAL LIGATION      SOCIAL HISTORY: Social History   Socioeconomic History   Marital status: Married    Spouse name: Rutherford Nail   Number of children: 2  Years of education: Not on file   Highest education level: Not on file  Occupational History    Comment: lost job because of COVID-19   Tobacco Use   Smoking status: Never   Smokeless tobacco: Never  Vaping Use   Vaping status: Never Used  Substance and Sexual Activity    Alcohol use: No    Alcohol/week: 0.0 standard drinks of alcohol   Drug use: No   Sexual activity: Yes    Partners: Male    Birth control/protection: None  Other Topics Concern   Not on file  Social History Narrative   Married; Lost job because of COVID-19; non-smoker; ocassional alcohol. [employer]   Social Determinants of Health   Financial Resource Strain: Medium Risk (03/11/2018)   Overall Financial Resource Strain (CARDIA)    Difficulty of Paying Living Expenses: Somewhat hard  Food Insecurity: No Food Insecurity (03/11/2018)   Hunger Vital Sign    Worried About Running Out of Food in the Last Year: Never true    Ran Out of Food in the Last Year: Never true  Transportation Needs: No Transportation Needs (03/11/2018)   PRAPARE - Administrator, Civil Service (Medical): No    Lack of Transportation (Non-Medical): No  Physical Activity: Sufficiently Active (03/11/2018)   Exercise Vital Sign    Days of Exercise per Week: 4 days    Minutes of Exercise per Session: 60 min  Stress: Stress Concern Present (03/11/2018)   [employer] of Occupational Health - Occupational Stress Questionnaire    Feeling of Stress : To some extent  Social Connections: Unknown (10/05/2021)   Received from [facility], [facility]   Social Network    Social Network: Not on file  Intimate Partner Violence: Unknown (09/07/2021)   Received from [facility], [facility]   HITS    Physically Hurt: Not on file    Insult or Talk Down To: Not on file    Threaten Physical Harm: Not on file    Scream or Curse: Not on file    FAMILY HISTORY: Family History  Problem Relation Age of Onset   Drug abuse Mother    Heart disease Father    Hypertension Father    Hyperlipidemia Father    Anxiety disorder Sister    Depression Sister    Dementia Paternal Grandmother    Breast cancer Neg Hx     ALLERGIES:  is allergic to other; 2,4-d dimethylamine; bee venom; gabapentin; nsaids; tetanus toxoid  adsorbed; and tetanus toxoids.  MEDICATIONS:  Current Outpatient Medications  Medication Sig Dispense Refill   cyanocobalamin (VITAMIN B12) 1000 MCG/ML injection INJECT 1 ML (1,000 MCG TOTAL) INTO THE MUSCLE EVERY 30 (THIRTY) DAYS FOR 1 DOSE. 1 mL 12   pantoprazole (PROTONIX) 40 MG tablet TAKE 1 TABLET BY MOUTH EVERY DAY 90 tablet 1   traMADol (ULTRAM) 50 MG tablet Take 1 tablet (50 mg total) by mouth every 8 (eight) hours as needed. 45 tablet 0   No current facility-administered medications for this visit.    PHYSICAL EXAMINATION: There were no vitals filed for this visit.  There were no vitals filed for this visit.  Physical Exam Constitutional:      Appearance: She is not ill-appearing.  Pulmonary:     Effort: Pulmonary effort is normal.  Skin:    Coloration: Skin is not jaundiced or pale.  Neurological:     Mental Status: She is alert and oriented to person, place, and time.  Psychiatric:  Mood and Affect: Mood normal.        Behavior: Behavior normal.    LABORATORY DATA:  I have reviewed the data as listed Lab Results  Component Value Date   WBC 5.1 03/18/2023   HGB 12.6 03/18/2023   HCT 37.4 03/18/2023   MCV 92.6 03/18/2023   PLT 202 03/18/2023   Recent Labs    11/28/22 1104 12/18/22 1306 03/18/23 1121  NA 138 136 137  K 4.6 3.5 4.1  CL 105 107 106  CO2 27 22 26   GLUCOSE 76 88 86  BUN 12 14 15   CREATININE 0.66 0.65 0.65  CALCIUM 8.4* 8.5* 8.6*  GFRNONAA >60 >60 >60  PROT 6.7 7.0 7.2  ALBUMIN 3.8 3.9 4.1  AST 43* 27 23  ALT 107* 38 31  ALKPHOS 76 78 70  BILITOT 0.2* 0.4 0.4   Iron/TIBC/Ferritin/ %Sat    Component Value Date/Time   IRON 51 03/18/2023 1122   TIBC 382 03/18/2023 1122   FERRITIN 12 03/18/2023 1122   IRONPCTSAT 13 03/18/2023 1122   No results found.  ASSESSMENT AND PLAN  Iron deficiency anemia- secondary to gastric bypass/malabsorption. July 2020 hemoglobin 11.5, ferritin 4. Today, hmg 12.6, nomocytic. Ferritin 12. Iron  sat 13%. Baseline in 70-90. Symptomatic. Plan for venofer x 3. She will trial Barimelts + Iron a sublingual iron supplement that can go into bloodstream. 1 daily. Available otc or online purchase.  B12 deficiency- secondary to malabsorption. Continue every other month injection of 1000 mcg/61ml. Continue home injections every other month.  Anxiety- mixed depressive and anxiety symptoms. Does not complain of this today. Continue follow up with pcp.  DISPOSITION: Venofer x 3 B12 every other month Rtc in 6 months for labs (cbc, ferritin, iron studies, b12) Day to week later see Dr B (virtual or in person). If in person, also +/- venofer- la  No problem-specific Assessment & Plan notes found for this encounter.  I discussed the assessment and treatment plan with the patient. The patient was provided an opportunity to ask questions and all were answered. The patient agreed with the plan and demonstrated an understanding of the instructions.   The patient was advised to call back or seek an in-person evaluation if the symptoms worsen or if the condition fails to improve as anticipated.   I spent 20 minutes face-to-face video visit time dedicated to the care of this patient on the date of this encounter to include pre-visit review of hematology notes, lab review, face-to-face time with the patient, and post visit ordering of testing/documentation.    Alinda Dooms, NP 03/21/2023

## 2023-03-27 ENCOUNTER — Encounter: Payer: Self-pay | Admitting: *Deleted

## 2023-03-27 ENCOUNTER — Encounter: Payer: Self-pay | Admitting: Internal Medicine

## 2023-03-28 ENCOUNTER — Ambulatory Visit: Payer: BC Managed Care – PPO

## 2023-04-04 ENCOUNTER — Inpatient Hospital Stay: Payer: BC Managed Care – PPO | Attending: Internal Medicine

## 2023-04-04 VITALS — BP 123/89 | HR 60 | Temp 96.4°F | Resp 18

## 2023-04-04 DIAGNOSIS — Z79899 Other long term (current) drug therapy: Secondary | ICD-10-CM | POA: Diagnosis not present

## 2023-04-04 DIAGNOSIS — E538 Deficiency of other specified B group vitamins: Secondary | ICD-10-CM | POA: Diagnosis not present

## 2023-04-04 DIAGNOSIS — D508 Other iron deficiency anemias: Secondary | ICD-10-CM

## 2023-04-04 DIAGNOSIS — D509 Iron deficiency anemia, unspecified: Secondary | ICD-10-CM | POA: Diagnosis present

## 2023-04-04 MED ORDER — SODIUM CHLORIDE 0.9% FLUSH
10.0000 mL | Freq: Once | INTRAVENOUS | Status: AC
  Administered 2023-04-04: 10 mL via INTRAVENOUS
  Filled 2023-04-04: qty 10

## 2023-04-04 MED ORDER — IRON SUCROSE 20 MG/ML IV SOLN
200.0000 mg | Freq: Once | INTRAVENOUS | Status: AC
  Administered 2023-04-04: 200 mg via INTRAVENOUS
  Filled 2023-04-04: qty 10

## 2023-04-04 NOTE — Progress Notes (Signed)
Pt has been educated and understands. Pt declined to stay 30 mins after iron infusion. VSS.

## 2023-04-11 ENCOUNTER — Inpatient Hospital Stay: Payer: BC Managed Care – PPO

## 2023-04-11 VITALS — BP 128/94 | HR 67 | Temp 96.9°F | Resp 16

## 2023-04-11 DIAGNOSIS — D509 Iron deficiency anemia, unspecified: Secondary | ICD-10-CM | POA: Diagnosis not present

## 2023-04-11 DIAGNOSIS — D508 Other iron deficiency anemias: Secondary | ICD-10-CM

## 2023-04-11 MED ORDER — SODIUM CHLORIDE 0.9% FLUSH
10.0000 mL | INTRAVENOUS | Status: DC | PRN
  Filled 2023-04-11: qty 10

## 2023-04-11 MED ORDER — CYANOCOBALAMIN 1000 MCG/ML IJ SOLN
1000.0000 ug | Freq: Once | INTRAMUSCULAR | Status: AC
  Administered 2023-04-11: 1000 ug via INTRAMUSCULAR

## 2023-04-11 MED ORDER — IRON SUCROSE 20 MG/ML IV SOLN
200.0000 mg | Freq: Once | INTRAVENOUS | Status: AC
Start: 2023-04-11 — End: 2023-04-11
  Administered 2023-04-11: 200 mg via INTRAVENOUS

## 2023-04-11 NOTE — Patient Instructions (Signed)
Iron Sucrose Injection What is this medication? IRON SUCROSE (EYE ern SOO krose) treats low levels of iron (iron deficiency anemia) in people with kidney disease. Iron is a mineral that plays an important role in making red blood cells, which carry oxygen from your lungs to the rest of your body. This medicine may be used for other purposes; ask your health care provider or pharmacist if you have questions. COMMON BRAND NAME(S): Venofer What should I tell my care team before I take this medication? They need to know if you have any of these conditions: Anemia not caused by low iron levels Heart disease High levels of iron in the blood Kidney disease Liver disease An unusual or allergic reaction to iron, other medications, foods, dyes, or preservatives Pregnant or trying to get pregnant Breastfeeding How should I use this medication? This medication is for infusion into a vein. It is given in a hospital or clinic setting. Talk to your care team about the use of this medication in children. While this medication may be prescribed for children as young as 2 years for selected conditions, precautions do apply. Overdosage: If you think you have taken too much of this medicine contact a poison control center or emergency room at once. NOTE: This medicine is only for you. Do not share this medicine with others. What if I miss a dose? Keep appointments for follow-up doses. It is important not to miss your dose. Call your care team if you are unable to keep an appointment. What may interact with this medication? Do not take this medication with any of the following: Deferoxamine Dimercaprol Other iron products This medication may also interact with the following: Chloramphenicol Deferasirox This list may not describe all possible interactions. Give your health care provider a list of all the medicines, herbs, non-prescription drugs, or dietary supplements you use. Also tell them if you smoke,  drink alcohol, or use illegal drugs. Some items may interact with your medicine. What should I watch for while using this medication? Visit your care team regularly. Tell your care team if your symptoms do not start to get better or if they get worse. You may need blood work done while you are taking this medication. You may need to follow a special diet. Talk to your care team. Foods that contain iron include: whole grains/cereals, dried fruits, beans, or peas, leafy green vegetables, and organ meats (liver, kidney). What side effects may I notice from receiving this medication? Side effects that you should report to your care team as soon as possible: Allergic reactions--skin rash, itching, hives, swelling of the face, lips, tongue, or throat Low blood pressure--dizziness, feeling faint or lightheaded, blurry vision Shortness of breath Side effects that usually do not require medical attention (report to your care team if they continue or are bothersome): Flushing Headache Joint pain Muscle pain Nausea Pain, redness, or irritation at injection site This list may not describe all possible side effects. Call your doctor for medical advice about side effects. You may report side effects to FDA at 1-800-FDA-1088. Where should I keep my medication? This medication is given in a hospital or clinic. It will not be stored at home. NOTE: This sheet is a summary. It may not cover all possible information. If you have questions about this medicine, talk to your doctor, pharmacist, or health care provider.  2024 Elsevier/Gold Standard (2022-10-25 00:00:00)  Vitamin B12 Injection What is this medication? Vitamin B12 (VAHY tuh min B12) prevents and treats low  vitamin B12 levels in your body. It is used in people who do not get enough vitamin B12 from their diet or when their digestive tract does not absorb enough. Vitamin B12 plays an important role in maintaining the health of your nervous system and  red blood cells. This medicine may be used for other purposes; ask your health care provider or pharmacist if you have questions. COMMON BRAND NAME(S): B-12 Compliance Kit, B-12 Injection Kit, Cyomin, Dodex, LA-12, Nutri-Twelve, Physicians EZ Use B-12, Primabalt, Vitamin Deficiency Injectable System - B12 What should I tell my care team before I take this medication? They need to know if you have any of these conditions: Kidney disease Leber's disease Megaloblastic anemia An unusual or allergic reaction to cyanocobalamin, cobalt, other medications, foods, dyes, or preservatives Pregnant or trying to get pregnant Breast-feeding How should I use this medication? This medication is injected into a muscle or deeply under the skin. It is usually given in a clinic or care team's office. However, your care team may teach you how to inject yourself. Follow all instructions. Talk to your care team about the use of this medication in children. Special care may be needed. Overdosage: If you think you have taken too much of this medicine contact a poison control center or emergency room at once. NOTE: This medicine is only for you. Do not share this medicine with others. What if I miss a dose? If you are given your dose at a clinic or care team's office, call to reschedule your appointment. If you give your own injections, and you miss a dose, take it as soon as you can. If it is almost time for your next dose, take only that dose. Do not take double or extra doses. What may interact with this medication? Alcohol Colchicine This list may not describe all possible interactions. Give your health care provider a list of all the medicines, herbs, non-prescription drugs, or dietary supplements you use. Also tell them if you smoke, drink alcohol, or use illegal drugs. Some items may interact with your medicine. What should I watch for while using this medication? Visit your care team regularly. You may need  blood work done while you are taking this medication. You may need to follow a special diet. Talk to your care team. Limit your alcohol intake and avoid smoking to get the best benefit. What side effects may I notice from receiving this medication? Side effects that you should report to your care team as soon as possible: Allergic reactions--skin rash, itching, hives, swelling of the face, lips, tongue, or throat Swelling of the ankles, hands, or feet Trouble breathing Side effects that usually do not require medical attention (report to your care team if they continue or are bothersome): Diarrhea This list may not describe all possible side effects. Call your doctor for medical advice about side effects. You may report side effects to FDA at 1-800-FDA-1088. Where should I keep my medication? Keep out of the reach of children. Store at room temperature between 15 and 30 degrees C (59 and 85 degrees F). Protect from light. Throw away any unused medication after the expiration date. NOTE: This sheet is a summary. It may not cover all possible information. If you have questions about this medicine, talk to your doctor, pharmacist, or health care provider.  2024 Elsevier/Gold Standard (2021-01-30 00:00:00)

## 2023-04-13 ENCOUNTER — Other Ambulatory Visit: Payer: Self-pay | Admitting: Internal Medicine

## 2023-04-14 ENCOUNTER — Encounter: Payer: Self-pay | Admitting: Internal Medicine

## 2023-04-18 ENCOUNTER — Inpatient Hospital Stay: Payer: BC Managed Care – PPO

## 2023-04-18 VITALS — BP 122/80 | HR 70 | Temp 97.0°F | Resp 17

## 2023-04-18 DIAGNOSIS — D508 Other iron deficiency anemias: Secondary | ICD-10-CM

## 2023-04-18 DIAGNOSIS — D509 Iron deficiency anemia, unspecified: Secondary | ICD-10-CM | POA: Diagnosis not present

## 2023-04-18 MED ORDER — SODIUM CHLORIDE 0.9 % IV SOLN
Freq: Once | INTRAVENOUS | Status: AC
Start: 2023-04-18 — End: 2023-04-18
  Filled 2023-04-18: qty 250

## 2023-04-18 MED ORDER — IRON SUCROSE 20 MG/ML IV SOLN
200.0000 mg | Freq: Once | INTRAVENOUS | Status: AC
  Administered 2023-04-18: 200 mg via INTRAVENOUS
  Filled 2023-04-18: qty 10

## 2023-04-18 NOTE — Progress Notes (Signed)
Pt reports that post last weeks Venofer she felt "sick to my stomach for 3 days and arm pain" and experiencing leg cramps for the last 4 days.  Consuello Masse NP at chairside.  Per Consuello Masse NP hang a 250 cc bag of NS and administer Venofer push through top port of the NS line, and then administer remainder of NS at a rate of 999cc/hr. Pt agrees with plan.  1605: Venofer administered without any s/s of adverse reaction.  1635: pt tolerated infusion well, no s/s of complication, no complaints at this time. Pt educated to call clinic with any concerns. Pt verbalizes understanding and stable at discharge.

## 2023-09-15 ENCOUNTER — Other Ambulatory Visit: Payer: Self-pay

## 2023-09-15 DIAGNOSIS — D508 Other iron deficiency anemias: Secondary | ICD-10-CM

## 2023-09-15 NOTE — Progress Notes (Signed)
 Date of service: 09/15/2023  SURGEON:   Rockey Pae, MD     PREOPERATIVE DIAGNOSIS: Lumbar facet arthropathy chronic; lumbar spondylosis without radiculopathy chronic  POSTOPERATIVE DIAGNOSIS: Lumbar facet arthropathy chronic; lumbar spondylosis without radiculopathy chronic   PROCEDURE: LUMBAR FACET JOINT INJECTION UTILIZING THE MEDIAL BRANCH TECHNIQUE WITH FLUOROSCOPY  LEVELS TREATED:   Bilateral L4-5 and L5-S1  with needles at Lumbar 4 and 5and sacral ala  BLOOD LOSS: minimal  COMPLICATIONS: none  THERAPEUTIC AGENT: 0.5cc 0.25% bupivacaine per needle   Procedure No: 1/2  DESCRIPTION:  The patient gave informed consent. Patient had failed conservative management including physical therapy, and had no previous posterior fusion at treated levels.  The patient was taken to the procedure room, placed in the prone position and back was prepped with antiseptic solution and draped in the usual fashion. Using sterile technique with fluoroscopic guidance the junction of the superior articulating process and transverse process was obtained, a sterile styletted spinal needle was advanced into the region, adjacent to the facet joint in order to block the ascending and descending medial branch nerves at corresponding level.  Once needle was appropriately positioned, aspiration was negative for heme or air. Therapeutic agent was administered in increments. Procedure was then repeated in identical manner listed above with separate needles at all treated levels.  All needles were removed intact.   The patient tolerated the procedure well and recovered uneventfully in the pain center recovery room. The procedure was performed as stated above. Discharge instructions were given.   ADDENDUM: Fluoroscopy time and Exposure, mRad were recorded and scanned into chart for procedure. Image count stored.  **levels based on palpation of painful areas under fluoroscopy  This patient has had moderate to severe  neck/low back pain that is predominately axial.  This pain has caused functional deficits and results in pain which is >/= to 4/10 on the VAS scale.  The pain has been present for >6 weeks and has not responded to conservative therapy including physical therapy, home exercise program, medication management, rest and passage of time.  After review of the applicable imaging and physical exam we deem the current pain to be facet joint mediated.  Therefore, we will proceed with facet joint injections utilizing the medial branch technique.  If Kiara Le finds benefit of >80% with two sets of medial branch blocks, we will plan to proceed with radiofrequency ablation of the previously treated nerves.

## 2023-09-16 ENCOUNTER — Inpatient Hospital Stay: Payer: BC Managed Care – PPO | Attending: Internal Medicine

## 2023-09-16 DIAGNOSIS — D509 Iron deficiency anemia, unspecified: Secondary | ICD-10-CM | POA: Insufficient documentation

## 2023-09-16 DIAGNOSIS — D508 Other iron deficiency anemias: Secondary | ICD-10-CM

## 2023-09-16 LAB — CBC WITH DIFFERENTIAL (CANCER CENTER ONLY)
Abs Immature Granulocytes: 0.02 10*3/uL (ref 0.00–0.07)
Basophils Absolute: 0.1 10*3/uL (ref 0.0–0.1)
Basophils Relative: 1 %
Eosinophils Absolute: 0.1 10*3/uL (ref 0.0–0.5)
Eosinophils Relative: 1 %
HCT: 41.8 % (ref 36.0–46.0)
Hemoglobin: 13.8 g/dL (ref 12.0–15.0)
Immature Granulocytes: 0 %
Lymphocytes Relative: 30 %
Lymphs Abs: 1.8 10*3/uL (ref 0.7–4.0)
MCH: 31.8 pg (ref 26.0–34.0)
MCHC: 33 g/dL (ref 30.0–36.0)
MCV: 96.3 fL (ref 80.0–100.0)
Monocytes Absolute: 0.2 10*3/uL (ref 0.1–1.0)
Monocytes Relative: 4 %
Neutro Abs: 3.8 10*3/uL (ref 1.7–7.7)
Neutrophils Relative %: 64 %
Platelet Count: 222 10*3/uL (ref 150–400)
RBC: 4.34 MIL/uL (ref 3.87–5.11)
RDW: 11.9 % (ref 11.5–15.5)
WBC Count: 6 10*3/uL (ref 4.0–10.5)
nRBC: 0 % (ref 0.0–0.2)

## 2023-09-16 LAB — IRON AND TIBC
Iron: 77 ug/dL (ref 28–170)
Saturation Ratios: 21 % (ref 10.4–31.8)
TIBC: 360 ug/dL (ref 250–450)
UIBC: 283 ug/dL

## 2023-09-16 LAB — VITAMIN B12: Vitamin B-12: 1140 pg/mL — ABNORMAL HIGH (ref 180–914)

## 2023-09-16 LAB — FERRITIN: Ferritin: 66 ng/mL (ref 11–307)

## 2023-09-17 ENCOUNTER — Inpatient Hospital Stay: Payer: BC Managed Care – PPO | Admitting: Internal Medicine

## 2023-09-17 ENCOUNTER — Inpatient Hospital Stay: Payer: BC Managed Care – PPO

## 2023-09-19 ENCOUNTER — Ambulatory Visit: Payer: BC Managed Care – PPO

## 2023-09-19 ENCOUNTER — Ambulatory Visit: Payer: BC Managed Care – PPO | Admitting: Internal Medicine

## 2023-09-22 ENCOUNTER — Inpatient Hospital Stay

## 2023-09-22 ENCOUNTER — Inpatient Hospital Stay: Admitting: Nurse Practitioner

## 2023-09-25 ENCOUNTER — Encounter: Payer: Self-pay | Admitting: *Deleted

## 2024-04-01 ENCOUNTER — Encounter: Payer: Self-pay | Admitting: Internal Medicine

## 2024-04-05 ENCOUNTER — Encounter (INDEPENDENT_AMBULATORY_CARE_PROVIDER_SITE_OTHER): Payer: Self-pay | Admitting: *Deleted
